# Patient Record
Sex: Female | Born: 1985 | Race: Asian | Hispanic: No | Marital: Married | Smoking: Never smoker
Health system: Southern US, Community
[De-identification: ages and names within clinical notes are randomized; demographics above are authoritative.]

## PROBLEM LIST (undated history)

## (undated) ENCOUNTER — Inpatient Hospital Stay (HOSPITAL_COMMUNITY): Payer: Self-pay

## (undated) DIAGNOSIS — R7611 Nonspecific reaction to tuberculin skin test without active tuberculosis: Secondary | ICD-10-CM

## (undated) DIAGNOSIS — Z8759 Personal history of other complications of pregnancy, childbirth and the puerperium: Secondary | ICD-10-CM

## (undated) DIAGNOSIS — B181 Chronic viral hepatitis B without delta-agent: Secondary | ICD-10-CM

## (undated) HISTORY — PX: NO PAST SURGERIES: SHX2092

## (undated) HISTORY — DX: Nonspecific reaction to tuberculin skin test without active tuberculosis: R76.11

## (undated) HISTORY — PX: DILATION AND CURETTAGE OF UTERUS: SHX78

---
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
## 2006-08-12 ENCOUNTER — Emergency Department (HOSPITAL_COMMUNITY): Admission: EM | Admit: 2006-08-12 | Discharge: 2006-08-12 | Payer: Self-pay | Admitting: Emergency Medicine

## 2008-01-03 ENCOUNTER — Emergency Department (HOSPITAL_COMMUNITY): Admission: EM | Admit: 2008-01-03 | Discharge: 2008-01-04 | Payer: Self-pay | Admitting: Emergency Medicine

## 2009-08-05 ENCOUNTER — Emergency Department (HOSPITAL_COMMUNITY): Admission: EM | Admit: 2009-08-05 | Discharge: 2009-08-06 | Payer: Self-pay | Admitting: Emergency Medicine

## 2009-08-21 ENCOUNTER — Ambulatory Visit: Payer: Self-pay | Admitting: Obstetrics and Gynecology

## 2009-08-21 LAB — CONVERTED CEMR LAB
Prolactin: 5.4 ng/mL
T3, Free: 3.5 pg/mL (ref 2.3–4.2)
Testosterone Free: 9 pg/mL — ABNORMAL HIGH (ref 0.6–6.8)
Testosterone: 63.34 ng/dL (ref 10–70)

## 2009-08-28 ENCOUNTER — Ambulatory Visit (HOSPITAL_COMMUNITY): Admission: RE | Admit: 2009-08-28 | Discharge: 2009-08-28 | Payer: Self-pay | Admitting: Obstetrics & Gynecology

## 2009-09-11 ENCOUNTER — Ambulatory Visit: Payer: Self-pay | Admitting: Obstetrics and Gynecology

## 2009-09-12 ENCOUNTER — Emergency Department (HOSPITAL_COMMUNITY): Admission: EM | Admit: 2009-09-12 | Discharge: 2009-09-12 | Payer: Self-pay | Admitting: Emergency Medicine

## 2009-12-13 ENCOUNTER — Ambulatory Visit: Payer: Self-pay | Admitting: Obstetrics and Gynecology

## 2010-03-14 ENCOUNTER — Ambulatory Visit: Payer: Self-pay | Admitting: Obstetrics & Gynecology

## 2010-06-18 ENCOUNTER — Encounter
Admission: RE | Admit: 2010-06-18 | Discharge: 2010-06-18 | Payer: Self-pay | Source: Home / Self Care | Admitting: Infectious Diseases

## 2010-09-04 DIAGNOSIS — H66009 Acute suppurative otitis media without spontaneous rupture of ear drum, unspecified ear: Secondary | ICD-10-CM

## 2010-12-11 ENCOUNTER — Telehealth: Payer: Self-pay | Admitting: Internal Medicine

## 2010-12-11 NOTE — Telephone Encounter (Signed)
Can she come in now.

## 2010-12-11 NOTE — Telephone Encounter (Signed)
Left msg for pt to call and schedule

## 2010-12-12 ENCOUNTER — Ambulatory Visit (INDEPENDENT_AMBULATORY_CARE_PROVIDER_SITE_OTHER): Payer: Medicaid Other | Admitting: Internal Medicine

## 2010-12-12 ENCOUNTER — Encounter: Payer: Self-pay | Admitting: Internal Medicine

## 2010-12-12 VITALS — BP 116/64 | HR 68 | Temp 98.8°F | Ht 60.0 in | Wt 134.0 lb

## 2010-12-12 DIAGNOSIS — M26609 Unspecified temporomandibular joint disorder, unspecified side: Secondary | ICD-10-CM

## 2010-12-12 DIAGNOSIS — J029 Acute pharyngitis, unspecified: Secondary | ICD-10-CM

## 2010-12-12 MED ORDER — CYCLOBENZAPRINE HCL 10 MG PO TABS: ORAL_TABLET | ORAL | Status: DC

## 2010-12-12 MED ORDER — AZITHROMYCIN 250 MG PO TABS: 250.0000 mg | ORAL_TABLET | Freq: Every day | ORAL | Status: DC

## 2010-12-12 NOTE — Patient Instructions (Signed)
Take meds as directed. Call if not better next week.

## 2010-12-12 NOTE — Progress Notes (Signed)
  Subjective:    Patient ID: Dana [name], female    DOB: 1985/10/10, 25 y.o.   MRN: [id number]  HPI Pt has right ear pain. Has right neck pain and headache. This has been going on for several days. Sore throat onset yesterday. No cough or cold symptoms.    Review of Systems     Objective:   Physical Exam TMs clear. Bilateral TM joint tenderness and popping. Pharynx slightly injected. Neck supple. Chest clear.. Tender right sternocleidomastoid muscle.       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

## 2011-05-04 ENCOUNTER — Encounter: Payer: Self-pay | Admitting: Internal Medicine

## 2011-05-05 ENCOUNTER — Ambulatory Visit (INDEPENDENT_AMBULATORY_CARE_PROVIDER_SITE_OTHER): Payer: Self-pay | Admitting: Internal Medicine

## 2011-05-05 ENCOUNTER — Encounter: Payer: Self-pay | Admitting: Internal Medicine

## 2011-05-05 VITALS — BP 102/70 | HR 72 | Temp 98.7°F | Ht 60.0 in | Wt 136.0 lb

## 2011-05-05 DIAGNOSIS — R0789 Other chest pain: Secondary | ICD-10-CM

## 2011-05-05 DIAGNOSIS — J069 Acute upper respiratory infection, unspecified: Secondary | ICD-10-CM

## 2011-05-05 DIAGNOSIS — N946 Dysmenorrhea, unspecified: Secondary | ICD-10-CM

## 2011-05-05 DIAGNOSIS — M542 Cervicalgia: Secondary | ICD-10-CM

## 2011-05-05 DIAGNOSIS — R071 Chest pain on breathing: Secondary | ICD-10-CM

## 2011-05-05 DIAGNOSIS — Z23 Encounter for immunization: Secondary | ICD-10-CM

## 2011-05-05 DIAGNOSIS — M549 Dorsalgia, unspecified: Secondary | ICD-10-CM

## 2011-05-05 NOTE — Progress Notes (Signed)
  Subjective:    Patient ID: Dana [name], female    DOB: 09/19/1985, 25 y.o.   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 Says it hurts to take a deep breath in her anterior chest. Some stuffiness in nose. Has had chest, back, and neck pain for several days. Is doing 7 or 8 pedicures daily. No fever or chills. No sputum production. Had hoped she would find a job as a Pharmacologist. Has not been able to find such a job. Mother has been laid off from her job. Patient also has dysmenorrhea. Has not had menstrual period this month but took pregnancy test which was negative. Patient denies sore throat. Feels like something is catching in her throat causing her to cough    Review of Systems     Objective:   Physical Exam HEENT exam: Pharynx is slightly injected. No exudate is noted. TMs are full bilaterally. Neck is supple. Chest is clear to auscultation. She has palpable chest wall tenderness in the parasternal areas bilaterally. Tender in the parascapular areas bilaterally. No significant adenopathy in neck.        Assessment & Plan:  URI  Chest wall pain  Neck pain  Upper back pain  Dysmenorrhea  Plan: Patient was treated today with Sterapred DS 10 mg 6 day dosepak, Zithromax Z-PAK take 2 tablets by mouth day one followed by 1 tablet by mouth days 2 through 5. Apply ice to neck and chest is sore for 20 minutes once or twice a day. For dysmenorrhea take Aleve 1 tab by mouth twice a day at onset of menses. Influenza immunization given today

## 2011-05-05 NOTE — Patient Instructions (Signed)
Take Zithromax Z-PAK as directed and six-day Sterapred DS 10 mg dosepak as directed. Apply ice to neck and chest for 20 minutes once or twice daily for pain relief. For menstrual cramps, take Aleve one tablet by mouth twice daily

## 2011-07-14 NOTE — L&D Delivery Note (Addendum)
Delivery Note At 2:34 PM a viable, healthy and preterm female was delivered via Vaginal, Spontaneous Delivery (Presentation: Right Occiput Anterior).  APGAR: 8, 8; weight 3 lb 12.3 oz (1710 g).   Placenta status: Retained. manual removal in fragments.  Sent to Pathology to rule out chorioamnionitis.  Uterus explored and bluntly curettaged. Cord: 3 vessels.  Anesthesia: Epidural  Episiotomy: Median Lacerations: None Suture Repair: 3.0 chromic Est. Blood Loss (mL): 400  Mom to postpartum.  Baby to NICU.  Mickel Baas 02/12/2012, 4:37 PM

## 2011-08-03 ENCOUNTER — Ambulatory Visit (INDEPENDENT_AMBULATORY_CARE_PROVIDER_SITE_OTHER): Payer: Self-pay | Admitting: Internal Medicine

## 2011-08-03 ENCOUNTER — Encounter: Payer: Self-pay | Admitting: Internal Medicine

## 2011-08-03 VITALS — BP 108/72 | HR 80 | Temp 98.6°F | Wt 132.5 lb

## 2011-08-03 DIAGNOSIS — Z3200 Encounter for pregnancy test, result unknown: Secondary | ICD-10-CM

## 2011-08-03 DIAGNOSIS — N912 Amenorrhea, unspecified: Secondary | ICD-10-CM

## 2011-08-03 NOTE — Progress Notes (Signed)
  Subjective:    Patient ID: Dana [name], female    DOB: [birth year], 26 y.o.   MRN: [id number]  HPI Pt in today for possible pregnancy. Has taken two urine pregnancy tests which were positive. Last menstrual period started on December 16 and lasted off and on for 6 days. She is married. Has been trying to get pregnant. Just returned from trip to [place] for 6 weeks last week. Had a cough and respiratory infection while over there. Took some antibiotics while there but doesn't know what she took. Says symptoms included laryngitis. Complaining of nausea for the past couple of days. Says breast are tender. Hasn't eaten much today.    Review of Systems     Objective:   Physical Exam not examined. Spent 10 minutes talking with patient.        Assessment & Plan:  Recent URI  Possible morning sickness versus viral syndrome  Likely to be pregnant with due date September 22 by dates. By dates she would be approximately [redacted] weeks pregnant. She is not on any prenatal vitamins.  Plan: Await serum hCG. Arrange appointment with obstetrician.

## 2011-08-03 NOTE — Patient Instructions (Signed)
If you or nauseated, try saltine crackers and clear liquids. Avoid orange juice and water. We will we'll contact you with results of pregnancy test tomorrow. We will arrange for obstetrical appointment.

## 2011-08-04 ENCOUNTER — Telehealth: Payer: Self-pay

## 2011-08-04 LAB — HCG, QUANTITATIVE, PREGNANCY: hCG, Beta Chain, Quant, S: 2747.5 m[IU]/mL

## 2011-08-04 NOTE — Telephone Encounter (Signed)
Opened in eror

## 2011-10-06 LAB — OB RESULTS CONSOLE RPR: RPR: NONREACTIVE

## 2011-10-06 LAB — OB RESULTS CONSOLE HIV ANTIBODY (ROUTINE TESTING): HIV: NONREACTIVE

## 2011-11-11 ENCOUNTER — Encounter (HOSPITAL_COMMUNITY): Payer: Self-pay | Admitting: *Deleted

## 2011-11-11 ENCOUNTER — Inpatient Hospital Stay (HOSPITAL_COMMUNITY)
Admission: AD | Admit: 2011-11-11 | Discharge: 2011-11-12 | Disposition: A | Discharge status: Home / Self Care | Payer: Medicaid Other | Source: Ambulatory Visit | Attending: Obstetrics and Gynecology | Admitting: Obstetrics and Gynecology

## 2011-11-11 DIAGNOSIS — O2390 Unspecified genitourinary tract infection in pregnancy, unspecified trimester: Secondary | ICD-10-CM | POA: Insufficient documentation

## 2011-11-11 DIAGNOSIS — B373 Candidiasis of vulva and vagina: Secondary | ICD-10-CM | POA: Insufficient documentation

## 2011-11-11 DIAGNOSIS — O26859 Spotting complicating pregnancy, unspecified trimester: Secondary | ICD-10-CM

## 2011-11-11 DIAGNOSIS — B3731 Acute candidiasis of vulva and vagina: Secondary | ICD-10-CM | POA: Insufficient documentation

## 2011-11-11 DIAGNOSIS — O26899 Other specified pregnancy related conditions, unspecified trimester: Secondary | ICD-10-CM

## 2011-11-11 DIAGNOSIS — O209 Hemorrhage in early pregnancy, unspecified: Secondary | ICD-10-CM | POA: Insufficient documentation

## 2011-11-11 HISTORY — DX: Chronic viral hepatitis B without delta-agent: B18.1

## 2011-11-11 NOTE — MAU Note (Signed)
Pt G1 EDC 04/03/2012, vaginal itching and discharge today.  After wiping noticed small amt of blood.  Pt having upper and lower abd pain.

## 2011-11-12 ENCOUNTER — Inpatient Hospital Stay (HOSPITAL_COMMUNITY): Payer: Medicaid Other

## 2011-11-12 ENCOUNTER — Encounter (HOSPITAL_COMMUNITY): Payer: Self-pay | Admitting: *Deleted

## 2011-11-12 LAB — URINE MICROSCOPIC-ADD ON

## 2011-11-12 LAB — URINALYSIS, ROUTINE W REFLEX MICROSCOPIC
Glucose, UA: NEGATIVE mg/dL
Ketones, ur: NEGATIVE mg/dL
Specific Gravity, Urine: 1.015 (ref 1.005–1.030)
pH: 7.5 (ref 5.0–8.0)

## 2011-11-12 LAB — WET PREP, GENITAL

## 2011-11-12 MED ORDER — FLUCONAZOLE 150 MG PO TABS: 150.0000 mg | ORAL_TABLET | Freq: Once | ORAL | Status: AC

## 2011-11-12 NOTE — Discharge Instructions (Signed)
Monilial Vaginitis Vaginitis in a soreness, swelling and redness (inflammation) of the vagina and vulva. Monilial vaginitis is not a sexually transmitted infection. CAUSES  Yeast vaginitis is caused by yeast (candida) that is normally found in your vagina. With a yeast infection, the candida has overgrown in number to a point that upsets the chemical balance. SYMPTOMS   White, thick vaginal discharge.   Swelling, itching, redness and irritation of the vagina and possibly the lips of the vagina (vulva).   Burning or painful urination.   Painful intercourse.  DIAGNOSIS  Things that may contribute to monilial vaginitis are:  Postmenopausal and virginal states.   Pregnancy.   Infections.   Being tired, sick or stressed, especially if you had monilial vaginitis in the past.   Diabetes. Good control will help lower the chance.   Birth control pills.   Tight fitting garments.   Using bubble bath, feminine sprays, douches or deodorant tampons.   Taking certain medications that kill germs (antibiotics).   Sporadic recurrence can occur if you become ill.  TREATMENT  Your caregiver will give you medication.  There are several kinds of anti monilial vaginal creams and suppositories specific for monilial vaginitis. For recurrent yeast infections, use a suppository or cream in the vagina 2 times a week, or as directed.   Anti-monilial or steroid cream for the itching or irritation of the vulva may also be used. Get your caregiver's permission.   Painting the vagina with methylene blue solution may help if the monilial cream does not work.   Eating yogurt may help prevent monilial vaginitis.  HOME CARE INSTRUCTIONS   Finish all medication as prescribed.   Do not have sex until treatment is completed or after your caregiver tells you it is okay.   Take warm sitz baths.   Do not douche.   Do not use tampons, especially scented ones.   Wear cotton underwear.   Avoid tight  pants and panty hose.   Tell your sexual partner that you have a yeast infection. They should go to their caregiver if they have symptoms such as mild rash or itching.   Your sexual partner should be treated as well if your infection is difficult to eliminate.   Practice safer sex. Use condoms.   Some vaginal medications cause latex condoms to fail. Vaginal medications that harm condoms are:   Cleocin cream.   Butoconazole (Femstat).   Terconazole (Terazol) vaginal suppository.   Miconazole (Monistat) (may be purchased over the counter).  SEEK MEDICAL CARE IF:   You have a temperature by mouth above 102 F (38.9 C).   The infection is getting worse after 2 days of treatment.   The infection is not getting better after 3 days of treatment.   You develop blisters in or around your vagina.   You develop vaginal bleeding, and it is not your menstrual period.   You have pain when you urinate.   You develop intestinal problems.   You have pain with sexual intercourse.  Document Released: 04/08/2005 Document Revised: 06/18/2011 Document Reviewed: 12/21/2008 Exit[employer] Patient Information 2012 [employer]. 

## 2011-11-12 NOTE — MAU Provider Note (Signed)
Dana [name] 11 y.o.G1P0 @[redacted]w[redacted]d  by LMP Chief Complaint  Patient presents with  . Vaginal Bleeding  . Vaginal Discharge  . Abdominal Pain     First Provider Initiated Contact with Patient 11/12/11 0026      SUBJECTIVE  HPI: Presents with one-day history of external genital itch and later noted right red blood on toilet paper after wiping. She denies cramping or pain except some upper abdominal "gas pain" and groin discomfort, worse with walking. Last intercourse 2 days ago. Has never had STI. Has not yet had anatomic ultrasound.  Past Medical History  Diagnosis Date  . Positive PPD   . Hepatitis B carrier 2003   Past Surgical History  Procedure Date  . No past surgeries    History   Social History  . Marital Status: Single    Spouse Name: N/A    Number of Children: N/A  . Years of Education: N/A   Occupational History  . Not on file.   Social History Main Topics  . Smoking status: Never Smoker   . Smokeless tobacco: Never Used  . Alcohol Use: No  . Drug Use: No  . Sexually Active: Yes   Other Topics Concern  . Not on file   Social History Narrative  . No narrative on file   No current facility-administered medications on file prior to encounter.   No current outpatient prescriptions on file prior to encounter.   No Known Allergies  ROS: Pertinent items in HPI  OBJECTIVE Blood pressure 117/66, pulse 83, temperature 97 F (36.1 C), temperature source Oral, resp. rate 16, height 5' (1.524 m), weight 64.229 kg (141 lb 9.6 oz), last menstrual period 06/28/2011.  GENERAL: Well-developed, well-nourished female in no acute distress.  ABDOMEN: Soft, nontender, S=D, FHR 152 EXTREMITIES: Nontender, no edema NEURO: Alert and oriented SPECULUM EXAM: NEFG, scant brownish discharge, cervix clean VE:  cervix L/C/H, scant brownish blood  LAB RESULTS Results for orders placed during the hospital encounter of 11/11/11 (from the past 24 hour(s))  URINALYSIS, ROUTINE W  REFLEX MICROSCOPIC     Status: Abnormal   Collection Time   11/11/11 11:55 PM      Component Value Range   Color, Urine YELLOW  YELLOW    APPearance CLOUDY (*) CLEAR    Specific Gravity, Urine 1.015  1.005 - 1.030    pH 7.5  5.0 - 8.0    Glucose, UA NEGATIVE  NEGATIVE (mg/dL)   Hgb urine dipstick NEGATIVE  NEGATIVE    Bilirubin Urine NEGATIVE  NEGATIVE    Ketones, ur NEGATIVE  NEGATIVE (mg/dL)   Protein, ur NEGATIVE  NEGATIVE (mg/dL)   Urobilinogen, UA 0.2  0.0 - 1.0 (mg/dL)   Nitrite NEGATIVE  NEGATIVE    Leukocytes, UA TRACE (*) NEGATIVE   URINE MICROSCOPIC-ADD ON     Status: Abnormal   Collection Time   11/11/11 11:55 PM      Component Value Range   Squamous Epithelial / LPF FEW (*) RARE    WBC, UA 0-2  <3 (WBC/hpf)   Bacteria, UA FEW (*) RARE   WET PREP, GENITAL     Status: Abnormal   Collection Time   11/12/11 12:30 AM      Component Value Range   Yeast Wet Prep HPF POC FEW (*) NONE SEEN    Trich, Wet Prep NONE SEEN  NONE SEEN    Clue Cells Wet Prep HPF POC NONE SEEN  NONE SEEN    WBC, Wet Prep HPF  POC FEW (*) NONE SEEN      IMAGING Limited US: FHR 143, plac ant above os with no evidence previa or abruption, cx 4 cm  ASSESSMENT G1 at [redacted]w[redacted]d Second tri scant bleeding Yeast vulvovaginitis  PLAN C/W Dr. Tenny [name] Rx Diflucan Reassured Pelvic rest until next appointment     Dana [name] 11/12/2011 12:48 AM

## 2011-12-15 ENCOUNTER — Encounter (HOSPITAL_COMMUNITY): Payer: Self-pay

## 2011-12-15 ENCOUNTER — Inpatient Hospital Stay (HOSPITAL_COMMUNITY)
Admission: AD | Admit: 2011-12-15 | Discharge: 2011-12-15 | Disposition: A | Discharge status: Home / Self Care | Payer: Medicaid Other | Source: Ambulatory Visit | Attending: Obstetrics and Gynecology | Admitting: Obstetrics and Gynecology

## 2011-12-15 DIAGNOSIS — O4700 False labor before 37 completed weeks of gestation, unspecified trimester: Secondary | ICD-10-CM | POA: Insufficient documentation

## 2011-12-15 DIAGNOSIS — N390 Urinary tract infection, site not specified: Secondary | ICD-10-CM

## 2011-12-15 DIAGNOSIS — B373 Candidiasis of vulva and vagina: Secondary | ICD-10-CM | POA: Insufficient documentation

## 2011-12-15 DIAGNOSIS — B3731 Acute candidiasis of vulva and vagina: Secondary | ICD-10-CM | POA: Insufficient documentation

## 2011-12-15 DIAGNOSIS — O2390 Unspecified genitourinary tract infection in pregnancy, unspecified trimester: Secondary | ICD-10-CM | POA: Insufficient documentation

## 2011-12-15 DIAGNOSIS — O2340 Unspecified infection of urinary tract in pregnancy, unspecified trimester: Secondary | ICD-10-CM

## 2011-12-15 DIAGNOSIS — B379 Candidiasis, unspecified: Secondary | ICD-10-CM

## 2011-12-15 DIAGNOSIS — R300 Dysuria: Secondary | ICD-10-CM | POA: Insufficient documentation

## 2011-12-15 LAB — WET PREP, GENITAL: Clue Cells Wet Prep HPF POC: NONE SEEN

## 2011-12-15 LAB — URINALYSIS, ROUTINE W REFLEX MICROSCOPIC
Bilirubin Urine: NEGATIVE
Hgb urine dipstick: NEGATIVE
pH: 7 (ref 5.0–8.0)

## 2011-12-15 LAB — URINE MICROSCOPIC-ADD ON

## 2011-12-15 MED ORDER — NITROFURANTOIN MONOHYD MACRO 100 MG PO CAPS: 100.0000 mg | ORAL_CAPSULE | Freq: Two times a day (BID) | ORAL | Status: AC

## 2011-12-15 MED ORDER — TERCONAZOLE 80 MG VA SUPP: 80.0000 mg | Freq: Every day | VAGINAL | Status: AC

## 2011-12-15 NOTE — Discharge Instructions (Signed)
Urinary Tract Infection in Pregnancy A urinary tract infection (UTI) is a bacterial infection of the urinary tract. Infection of the urinary tract can include the ureters, kidneys (pyelonephritis), bladder (cystitis), and urethra (urethritis). All pregnant women should be screened for bacteria in the urinary tract. Identifying and treating a UTI will decrease the risk of preterm labor and developing more serious infections in both the mother and baby. CAUSES Bacteria germs cause almost all UTIs. There are many factors that can increase your chances of getting a UTI during pregnancy. These include:  Having a short urethra.   Poor toilet and hygiene habits.   Sexual intercourse.   Blockage of urine along the urinary tract.   Problems with the pelvic muscles or nerves.   Diabetes.   Obesity.   Bladder problems after having several children.   Previous history of UTI.  SYMPTOMS   Pain, burning, or a stinging feeling when urinating.   Suddenly feeling the need to urinate right away (urgency).   Loss of bladder control (urinary incontinence).   Frequent urination, more than is common with pregnancy.   Lower abdominal or back discomfort.   Bad smelling urine.   Cloudy urine.   Blood in the urine (hematuria).   Fever.  When the kidneys are infected, the symptoms may be:  Back pain.   Flank pain on the right side more so than the left.   Fever.   Chills.   Nausea.   Vomiting.  DIAGNOSIS   Urine tests.   Additional tests and procedures may include:   Ultrasound of the kidneys, ureters, bladder, and urethra.   Looking in the bladder with a lighted tube (cystoscopy).   Certain X-ray studies only when absolutely necessary.  Finding out the results of your test Ask when your test results will be ready. Make sure you get your test results. TREATMENT  Antibiotic medicine by mouth.   Antibiotics given through the vein (intravenously), if needed.  HOME CARE  INSTRUCTIONS   Take your antibiotics as directed. Finish them even if you start to feel better. Only take medicine as directed by your caregiver.   Drink enough fluids to keep your urine clear or pale yellow.   Do not have sexual intercourse until the infection is gone and your caregiver says it is okay.   Make sure you are tested for UTIs throughout your pregnancy if you get one. These infections often come back.  Preventing a UTI in the future:  Practice good toilet habits. Always wipe from front to back. Use the tissue only once.   Do not hold your urine. Empty your bladder as soon as possible when the urge comes.   Do not douche or use deodorant sprays.   Wash with soap and warm water around the genital area and the anus.   Empty your bladder before and after sexual intercourse.   Wear underwear with a cotton crotch.   Avoid caffeine and carbonated drinks. They can irritate the bladder.   Drink cranberry juice or take cranberry pills. This may decrease the risk of getting a UTI.   Do not drink alcohol.   Keep all your appointments and tests as scheduled.  SEEK MEDICAL CARE IF:   Your symptoms get worse.   You are still having fevers 2 or more days after treatment begins.   You develop a rash.   You feel that you are having problems with medicines prescribed.   You develop abnormal vaginal discharge.  SEEK IMMEDIATE MEDICAL   CARE IF:   You develop back or flank pain.   You develop chills.   You have blood in your urine.   You develop nausea and vomiting.   You develop contractions of your uterus.   You have a gush of fluid from the vagina.  MAKE SURE YOU:   Understand these instructions.   Will watch your condition.   Will get help right away if you are not doing well or get worse.  Document Released: 10/24/2010 Document Revised: 06/18/2011 Document Reviewed: 10/24/2010 Exit[employer] Patient Information 2012 [employer]. 

## 2011-12-15 NOTE — MAU Note (Signed)
Contractions 4-5 times an hour since 8pm. Spotting on toilet paper on Friday. Vaginal itching & burning with urination for a few days. Denies vaginal discharge. Positive fetal movement.

## 2011-12-15 NOTE — MAU Provider Note (Addendum)
History     CSN: [id number]  Arrival date and time: 12/15/11 0008   None     Chief Complaint  Patient presents with  . Contractions   HPI This is a 26 year old G2 P0 010 at 23 weeks and one-day who presents the MAU with 4 hours of contractions. The patient feels 5-6 contractions an hour. No palliating provoking factors. The patient does admit to having dysuria for 3 days as well as vaginal itching. She denies fevers, chills, nausea, vomiting. She has mild lumbosacral pain.  OB History    Grav Para Term Preterm Abortions TAB SAB Ect Mult Living   2    1 1           Past Medical History  Diagnosis Date  . Positive PPD   . Hepatitis B carrier 2003    Past Surgical History  Procedure Date  . No past surgeries     Family History  Problem Relation Age of Onset  . Hypertension Father   . Anesthesia problems Neg Hx     History  Substance Use Topics  . Smoking status: Never Smoker   . Smokeless tobacco: Never Used  . Alcohol Use: No    Allergies: No Known Allergies  Prescriptions prior to admission  Medication Sig Dispense Refill  . alum & mag hydroxide-simeth (MAALOX/MYLANTA) 200-200-20 MG/5ML suspension Take 5 mLs by mouth every 6 (six) hours as needed. For upset stomach      . Prenatal Vit-Fe Fumarate-FA (PRENATAL MULTIVITAMIN) TABS Take 1 tablet by mouth daily.      Marland Kitchen acetaminophen (TYLENOL) 325 MG tablet Take 325 mg by mouth every 6 (six) hours as needed. For headache        Review of Systems  Constitutional: Negative for fever, chills and weight loss.  Gastrointestinal: Negative for nausea, vomiting, abdominal pain, diarrhea and constipation.   Physical Exam   Blood pressure 113/66, pulse 86, temperature 97.3 F (36.3 C), temperature source Oral, resp. rate 18, height 5' (1.524 m), weight 66.497 kg (146 lb 9.6 oz), last menstrual period 06/28/2011, SpO2 98.00%.  Physical Exam  Constitutional: She is oriented to person, place, and time. She appears  well-developed and well-nourished.  Respiratory: Effort normal.  GI: Soft. She exhibits no distension and no mass. There is no tenderness. There is no rebound and no guarding.  Genitourinary:       Normal external female genitalia. White clumpy discharge. Mild cervicitis with friability. Slight bleeding after GC chlamydia swab obtained. Cervix visually closed  Neurological: She is alert and oriented to person, place, and time.  Skin: Skin is warm and dry.  Psychiatric: She has a normal mood and affect. Her behavior is normal. Judgment and thought content normal.   Cervix closed, long, thick  Results for orders placed during the hospital encounter of 12/15/11 (from the past 24 hour(s))  URINALYSIS, ROUTINE W REFLEX MICROSCOPIC     Status: Abnormal   Collection Time   12/15/11 12:12 AM      Component Value Range   Color, Urine YELLOW  YELLOW    APPearance CLEAR  CLEAR    Specific Gravity, Urine 1.020  1.005 - 1.030    pH 7.0  5.0 - 8.0    Glucose, UA NEGATIVE  NEGATIVE (mg/dL)   Hgb urine dipstick NEGATIVE  NEGATIVE    Bilirubin Urine NEGATIVE  NEGATIVE    Ketones, ur NEGATIVE  NEGATIVE (mg/dL)   Protein, ur NEGATIVE  NEGATIVE (mg/dL)   Urobilinogen, UA 0.2  0.0 - 1.0 (mg/dL)   Nitrite NEGATIVE  NEGATIVE    Leukocytes, UA SMALL (*) NEGATIVE   URINE MICROSCOPIC-ADD ON     Status: Abnormal   Collection Time   12/15/11 12:12 AM      Component Value Range   Squamous Epithelial / LPF MANY (*) RARE    WBC, UA 7-10  <3 (WBC/hpf)   RBC / HPF 0-2  <3 (RBC/hpf)   Bacteria, UA FEW (*) RARE    Urine-Other MUCOUS PRESENT    WET PREP, GENITAL     Status: Abnormal   Collection Time   12/15/11 12:41 AM      Component Value Range   Yeast Wet Prep HPF POC FEW (*) NONE SEEN    Trich, Wet Prep NONE SEEN  NONE SEEN    Clue Cells Wet Prep HPF POC NONE SEEN  NONE SEEN    WBC, Wet Prep HPF POC FEW (*) NONE SEEN      MAU Course  Procedures NST category 1 tracing consistent with gestational age.  There are no contractions seen despite being monitored for almost an hour.  MDM No evidence of pyelonephritis given no CVA tenderness or fever. We'll treat patient for UTI and yeast infection. No evidence of preterm labor.  Assessment and Plan  #1 UTI #2 vaginal yeast infection We'll treat patient with Macrobid and Terazol vaginal suppositories respectively. Patient followup with [facility] at next appointment or sooner if this not tolerate medications. Discussed patient with Dr. Tenny Craw.  Dana [name] JEHIEL 12/15/2011, 12:48 AM

## 2011-12-16 LAB — GC/CHLAMYDIA PROBE AMP, GENITAL: GC Probe Amp, Genital: NEGATIVE

## 2012-01-11 LAB — OB RESULTS CONSOLE HGB/HCT, BLOOD
HCT: 40 %
Hemoglobin: 12.5 g/dL

## 2012-01-11 LAB — OB RESULTS CONSOLE PLATELET COUNT: Platelets: 257 10*3/uL

## 2012-01-23 ENCOUNTER — Encounter (HOSPITAL_COMMUNITY): Payer: Self-pay | Admitting: Obstetrics and Gynecology

## 2012-01-23 ENCOUNTER — Inpatient Hospital Stay (HOSPITAL_COMMUNITY)
Admission: AD | Admit: 2012-01-23 | Discharge: 2012-02-14 | DRG: 774 | Disposition: A | Discharge status: Home / Self Care | Payer: Medicaid Other | Source: Ambulatory Visit | Attending: Obstetrics and Gynecology | Admitting: Obstetrics and Gynecology

## 2012-01-23 ENCOUNTER — Inpatient Hospital Stay (HOSPITAL_COMMUNITY): Payer: Medicaid Other

## 2012-01-23 DIAGNOSIS — O4690 Antepartum hemorrhage, unspecified, unspecified trimester: Secondary | ICD-10-CM | POA: Diagnosis not present

## 2012-01-23 DIAGNOSIS — O4290 Premature rupture of membranes, unspecified as to length of time between rupture and onset of labor, unspecified weeks of gestation: Principal | ICD-10-CM | POA: Diagnosis present

## 2012-01-23 DIAGNOSIS — K219 Gastro-esophageal reflux disease without esophagitis: Secondary | ICD-10-CM | POA: Diagnosis not present

## 2012-01-23 DIAGNOSIS — O99892 Other specified diseases and conditions complicating childbirth: Secondary | ICD-10-CM | POA: Diagnosis not present

## 2012-01-23 HISTORY — DX: Personal history of other complications of pregnancy, childbirth and the puerperium: Z87.59

## 2012-01-23 LAB — WET PREP, GENITAL
Clue Cells Wet Prep HPF POC: NONE SEEN
Trich, Wet Prep: NONE SEEN

## 2012-01-23 LAB — CBC
Hemoglobin: 12.9 g/dL (ref 12.0–15.0)
MCH: 25.4 pg — ABNORMAL LOW (ref 26.0–34.0)
Platelets: 241 10*3/uL (ref 150–400)
RBC: 5.07 MIL/uL (ref 3.87–5.11)
WBC: 13 10*3/uL — ABNORMAL HIGH (ref 4.0–10.5)

## 2012-01-23 MED ORDER — SODIUM CHLORIDE 0.9 % IJ SOLN
3.0000 mL | Freq: Two times a day (BID) | INTRAMUSCULAR | Status: DC
  Administered 2012-01-23 (×2): 3 mL via INTRAVENOUS

## 2012-01-23 MED ORDER — ZOLPIDEM TARTRATE 5 MG PO TABS
5.0000 mg | ORAL_TABLET | Freq: Every evening | ORAL | Status: DC | PRN
  Administered 2012-02-07 – 2012-02-12 (×2): 5 mg via ORAL
  Filled 2012-01-23 (×2): qty 1

## 2012-01-23 MED ORDER — BETAMETHASONE SOD PHOS & ACET 6 (3-3) MG/ML IJ SUSP
12.0000 mg | INTRAMUSCULAR | Status: AC
  Administered 2012-01-23 – 2012-01-24 (×2): 12 mg via INTRAMUSCULAR
  Filled 2012-01-23 (×2): qty 2

## 2012-01-23 MED ORDER — LACTATED RINGERS IV SOLN
INTRAVENOUS | Status: DC
  Administered 2012-01-23 – 2012-01-26 (×7): via INTRAVENOUS

## 2012-01-23 MED ORDER — CALCIUM CARBONATE ANTACID 500 MG PO CHEW
2.0000 | CHEWABLE_TABLET | ORAL | Status: DC | PRN
  Administered 2012-01-25 – 2012-02-09 (×3): 400 mg via ORAL
  Filled 2012-01-23 (×4): qty 2

## 2012-01-23 MED ORDER — DOCUSATE SODIUM 100 MG PO CAPS
100.0000 mg | ORAL_CAPSULE | Freq: Every day | ORAL | Status: DC
  Administered 2012-01-23 – 2012-02-11 (×20): 100 mg via ORAL
  Filled 2012-01-23 (×22): qty 1

## 2012-01-23 MED ORDER — LACTATED RINGERS IV BOLUS (SEPSIS)
1000.0000 mL | Freq: Once | INTRAVENOUS | Status: AC
  Administered 2012-01-23: 1000 mL via INTRAVENOUS

## 2012-01-23 MED ORDER — PRENATAL MULTIVITAMIN CH
1.0000 | ORAL_TABLET | Freq: Every day | ORAL | Status: DC
  Administered 2012-01-23 – 2012-02-11 (×20): 1 via ORAL
  Filled 2012-01-23 (×22): qty 1

## 2012-01-23 MED ORDER — ACETAMINOPHEN 325 MG PO TABS
650.0000 mg | ORAL_TABLET | ORAL | Status: DC | PRN
  Administered 2012-01-24 – 2012-02-11 (×4): 650 mg via ORAL
  Filled 2012-01-23 (×5): qty 2

## 2012-01-23 NOTE — Progress Notes (Signed)
I was asked by Kerrie Buffalo NP to update Dr. Claiborne Billings regarding patients Dana [name] and cervical exam. Dr. Claiborne Billings notified of patients Dana [name] results and cervical exam fingertip, middle, brown discharge, 2 contractions noted in the last 30 mins of fetal strip. ABO/RH ordered per Dr. Claiborne Billings, ok for patient to wait for results in lobby. If RH neg will call Dr. Claiborne Billings for orders. Lynder Parents NP notified of plan of care.

## 2012-01-23 NOTE — H&P (Signed)
26 y.o. [redacted]w[redacted]d  G2P0010 comes in c/o bleeding.  Per pt she noted some RLQ pain while at work yesterday.  When arriving home noted dark brown blood when wiping.  This continued several more times.  This morning she reported that she had some bright red bleeding but not a large amount.  She states she has had no pain since yesterday and does not currently have any pain.  Was alerted by MAU that during spec exam bright red bleeding was noticed coming from the os, and that the previously stated 2 ctx in 30 min had increased to every 4 mins.  Otherwise has good fetal movement.  Past Medical History  Diagnosis Date  . Positive PPD   . 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 Abortion history     Past Surgical History  Procedure Date  . No past surgeries     OB History    Grav Para Term Preterm Abortions TAB SAB Ect Mult Living   2 0 0 0 1 1  0 0 0     # Outc Date GA Lbr Len/2nd Wgt Sex Del Anes PTL Lv   1 TAB            2 CUR               History   Social History  . Marital Status: Single    Spouse Name: N/A    Number of Children: N/A  . Years of Education: N/A   Occupational History  . Not on file.   Social History Main Topics  . Smoking status: Never Smoker   . Smokeless tobacco: Never Used  . Alcohol Use: No  . Drug Use: No  . Sexually Active: Yes   Other Topics Concern  . Not on file   Social History Narrative  . No narrative on file   Review of patient's allergies indicates no known allergies.   Prenatal Course:  Hep B carrier, hx of +PPD, otherwise uncomplicated  Filed Vitals:   01/23/12 1415  BP: 103/65  Pulse: 89  Temp: 97.1 F (36.2 C)  Resp: 20     Lungs/Cor:  NAD Abdomen:  soft, gravid Ex:  no cords, erythema SVE:  FT/thick/posterior FHTs:  130, good STV, NST R, some shallow variables Toco:  Irreg, recently q4-5   A/P  Admit for observation, r/o placental abruption  Betamethasone x 2  No tocolysis at this time  Continuous fetal monitoring  Check  Abo/Rh  Other routine care.  Philip Aspen

## 2012-01-23 NOTE — MAU Provider Note (Signed)
History     CSN: [id number]  Arrival date and time: 01/23/12 1191   First Provider Initiated Contact with Patient 01/23/12 1051      Chief Complaint  Patient presents with  . Vaginal Bleeding   HPI Dana [name] is a 26 y.o. female @ [redacted]w[redacted]d gestation who presents to MAU for vaginal bleeding. The bleeding started yesterday. She describes the bleeding as light when she went to the bathroom. Today the bleeding has increased. Lower abdominal pain that she describes as cramping with radiation to the lower back. She rates the pain as 7/10. Last sexual intercourse 2 months ago. No problems with the pregnancy until yesterday. The history was provided by the patient.  OB History    Grav Para Term Preterm Abortions TAB SAB Ect Mult Living   2 0 0 0 1 1  0 0 0      Past Medical History  Diagnosis Date  . Positive PPD   . 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 Abortion history     Past Surgical History  Procedure Date  . No past surgeries     Family History  Problem Relation Age of Onset  . Hypertension Father   . Anesthesia problems Neg Hx     History  Substance Use Topics  . Smoking status: Never Smoker   . Smokeless tobacco: Never Used  . Alcohol Use: No    Allergies: No Known Allergies  Prescriptions prior to admission  Medication Sig Dispense Refill  . Prenatal Vit-Fe Fumarate-FA (PRENATAL MULTIVITAMIN) TABS Take 1 tablet by mouth daily.        Review of Systems  Constitutional: Negative for fever, chills, weight loss and malaise/fatigue.  HENT: Negative for ear pain, nosebleeds, congestion and sore throat.   Eyes: Negative for blurred vision, double vision and photophobia.  Respiratory: Negative for cough and wheezing.   Cardiovascular: Negative for chest pain, palpitations and leg swelling.  Gastrointestinal: Positive for abdominal pain. Negative for nausea, vomiting, diarrhea and constipation.  Genitourinary: Positive for urgency (pressure) and frequency. Negative for  dysuria and flank pain.       Vaginal bleeding.  Musculoskeletal: Positive for back pain.  Skin: Negative.   Neurological: Positive for headaches. Negative for dizziness and seizures.  Psychiatric/Behavioral: Negative for depression. The patient is not nervous/anxious.    Physical Exam   Blood pressure 110/66, pulse 94, temperature 98.2 F (36.8 C), temperature source Oral, resp. rate 18, height 5' (1.524 m), weight 151 lb (68.493 kg), last menstrual period 06/28/2011.  Physical Exam  Nursing note and vitals reviewed. Constitutional: She is oriented to person, place, and time. She appears well-developed and well-nourished. No distress.  HENT:  Head: Normocephalic and atraumatic.  Eyes: EOM are normal.  Neck: Neck supple.  Cardiovascular: Normal rate.   Respiratory: Effort normal.  GI: Soft. There is tenderness in the right lower quadrant, suprapubic area and left lower quadrant.       Tenderness is minimal.  Genitourinary:       Cervix = finger tip, firm, brown vaginal discharge.  Musculoskeletal: Normal range of motion.  Neurological: She is alert and oriented to person, place, and time.  Skin: Skin is warm and dry.  Psychiatric: She has a normal mood and affect. Her behavior is normal. Judgment and thought content normal.   EFM: Baseline 130, reactive tracing category I, 3 contractions in 30 minute  MAU Course: Dr. Claiborne Billings notified of clinical finding  Procedures Ultrasound, normal placenta, cervical length and fluid.  Dana,[name] 01/23/2012, 10:53 AM   Dr. Claiborne Billings request blood type and if Rh negative patient may be discharged home to follow up in the office.   Patient began contracting again. Care turned over to Dana [name], CNM she will re evaluate the patient and call Dr. Claiborne Billings with results.  [place], RN, FNP, [facility]   SSE:  Small amount of bright red blood coming out of cervical os.  CX non-friable.  Normal appearing discharge except for dark brown blood.  CX  FT/Long/soft/anterior.  Now having contractions q 5-10 minutes, rated 7/10 by pt, felt in lower abdomen and radiating towards back. Dr. Maximino Greenland given status and will admit for steroids/observation for presumed chronic abruption.

## 2012-01-23 NOTE — Progress Notes (Signed)
I was asked by Kerrie Buffalo NP to contact Dr. Claiborne Billings and notify her of patient complaints of vaginal bleeding and abdominal pain. Dr. Claiborne Billings notified of contractions and Fetal strip, patient states she is bleeding more than a period. Orders received and will notify Dr. Claiborne Billings when results are back.

## 2012-01-23 NOTE — MAU Note (Signed)
Pt states scant amount bright red - dark red vaginal bleeding since last pm.

## 2012-01-23 NOTE — MAU Note (Signed)
Mayer Camel Np asked me to check patients cervix and notify her of results.

## 2012-01-23 NOTE — Progress Notes (Signed)
1545-drClaiborne Billings notifed of uc pattern and small amt of bleeding on peri-pad. Orders received for lr bolus over 1hour.

## 2012-01-23 NOTE — MAU Note (Signed)
Patient reports having vaginal bleeding when wipes since last night, positive fm, patient taken directly to room 4 from lobby.

## 2012-01-24 LAB — CBC
MCV: 77.3 fL — ABNORMAL LOW (ref 78.0–100.0)
Platelets: 236 10*3/uL (ref 150–400)
RBC: 5.03 MIL/uL (ref 3.87–5.11)
RDW: 14.4 % (ref 11.5–15.5)
WBC: 20.9 10*3/uL — ABNORMAL HIGH (ref 4.0–10.5)

## 2012-01-24 LAB — RAPID HIV SCREEN (WH-MAU): Rapid HIV Screen: NONREACTIVE

## 2012-01-24 LAB — RUBELLA SCREEN: Rubella: 69 IU/mL — ABNORMAL HIGH

## 2012-01-24 LAB — DIFFERENTIAL
Basophils Absolute: 0 10*3/uL (ref 0.0–0.1)
Eosinophils Absolute: 0 10*3/uL (ref 0.0–0.7)
Eosinophils Relative: 0 % (ref 0–5)
Lymphocytes Relative: 7 % — ABNORMAL LOW (ref 12–46)
Neutrophils Relative %: 89 % — ABNORMAL HIGH (ref 43–77)

## 2012-01-24 LAB — OB RESULTS CONSOLE HIV ANTIBODY (ROUTINE TESTING): HIV: NONREACTIVE

## 2012-01-24 MED ORDER — SODIUM CHLORIDE 0.9 % IV SOLN
2.0000 g | Freq: Four times a day (QID) | INTRAVENOUS | Status: AC
  Administered 2012-01-24 – 2012-01-25 (×8): 2 g via INTRAVENOUS
  Filled 2012-01-24 (×8): qty 2000

## 2012-01-24 MED ORDER — SODIUM CHLORIDE 0.9 % IV SOLN
250.0000 mg | Freq: Four times a day (QID) | INTRAVENOUS | Status: DC
  Administered 2012-01-24 – 2012-01-26 (×9): 250 mg via INTRAVENOUS
  Filled 2012-01-24 (×10): qty 250

## 2012-01-24 MED ORDER — SODIUM CHLORIDE 0.9 % IV SOLN: 2.0000 g | Freq: Four times a day (QID) | INTRAVENOUS | Status: DC

## 2012-01-24 NOTE — Progress Notes (Signed)
Pt comfortable, denies feeling ctx, no continued VB +FM, +LOF - RN confirmed +ferns AFVSS FHT 125 mod var +accels no recent decels TOCO 3 ctx in past hour SVE: def A/P: PPROM Contiue Ampicllin 2g q6 + Erythromycin 250mg  q6 x 48, followed by po amoxicillin and po erythormycin for 5 days for latency Second dose of beta today No tocolysis Continuous fetal monitoring

## 2012-01-24 NOTE — Plan of Care (Signed)
Problem: Consults Goal: Birthing Suites Patient Information Press F2 to bring up selections list   Pt < [redacted] weeks EGA     

## 2012-01-25 ENCOUNTER — Inpatient Hospital Stay (HOSPITAL_COMMUNITY): Payer: Medicaid Other

## 2012-01-25 LAB — CBC
MCH: 25.2 pg — ABNORMAL LOW (ref 26.0–34.0)
MCHC: 32.1 g/dL (ref 30.0–36.0)
Platelets: 260 10*3/uL (ref 150–400)
RBC: 4.81 MIL/uL (ref 3.87–5.11)

## 2012-01-25 LAB — HEPATITIS B SURFACE ANTIGEN: Hepatitis B Surface Ag: POSITIVE — AB

## 2012-01-25 NOTE — Consult Note (Signed)
[facility]  Neonatal Medicine Consultation       01/25/2012    12:07 AM  I was called at the request of the patient's obstetrician (Dr. Claiborne Billings) to speak to this patient due to premature rupture of membranes at 28-[redacted] weeks gestation.  She presented to the hospital yesterday with vaginal bleeding worrisome for placental abruption (she has improved since then).  She has been given betamethasone and antibiotics.    I reviewed expectations for a preterm delivery at this gestational age and beyond.  I covered expected survival, length of stay, morbidity, and long-term outcome.  In particular I discussed respiratory distress and the possible treatments, infection, feeding, intracranial bleeding.  Mom plans to breast feed, which I encouraged.  She had a number of questions that I was able to discuss.  _____________________ Electronically Signed By: Angelita Ingles, MD Neonatologist

## 2012-01-25 NOTE — Progress Notes (Signed)
Afebrile, VSS.  Will do AFI today for baseline status post PPROM.

## 2012-01-26 MED ORDER — SODIUM CHLORIDE 0.9 % IJ SOLN
3.0000 mL | Freq: Two times a day (BID) | INTRAMUSCULAR | Status: DC
  Administered 2012-01-26 – 2012-01-27 (×3): 3 mL via INTRAVENOUS

## 2012-01-26 MED ORDER — AMOXICILLIN 500 MG PO CAPS
500.0000 mg | ORAL_CAPSULE | Freq: Three times a day (TID) | ORAL | Status: DC
  Administered 2012-01-26 – 2012-01-30 (×12): 500 mg via ORAL
  Filled 2012-01-26 (×13): qty 1

## 2012-01-26 MED ORDER — AZITHROMYCIN 500 MG PO TABS
500.0000 mg | ORAL_TABLET | Freq: Every day | ORAL | Status: DC
  Administered 2012-01-26 – 2012-01-29 (×4): 500 mg via ORAL
  Filled 2012-01-26 (×5): qty 1

## 2012-01-26 NOTE — Progress Notes (Signed)
Patient ID: MOOREA BOISSONNEAULT, female   DOB: 16-Aug-1985, 26 y.o.   MRN: [id number]   HD#4  29+1 PPROM  S: Doing well, continues to have LOF.  Denies vaginal bleeding.  Active FM. Rare contractions O:  Filed Vitals:   01/26/12 0740  BP: 83/45  Pulse: 77  Temp: 98.1 F (36.7 C)  Resp: 18  AOx3, NAD Gravid soft NT FHT 140-150 reactive cvx deferred toco quiet  U/S 7/15: vtx, AFI 7.3 (previously 13.5)  A/P: HD#4, PPROM D#3 1) Cont BR w/ BRP 2) FWB reassuring, change monitoring to NST Q shift, toco prn 3) Change abx to po amox & azithro x 5 days. Change to azithro d/t concern for GI intolerence to po erythro. 4) Cont SCDs 5) Have Prenatals faxed over from office

## 2012-01-26 NOTE — Progress Notes (Signed)
UR Chart review completed.  

## 2012-01-27 NOTE — Progress Notes (Signed)
Patient is stable.  Minimal leaking.  Afebrile.  FHT's reactive (monitored 30 minutes t.i.d.).

## 2012-01-28 NOTE — Progress Notes (Signed)
Patient ID: Dana [name], female   DOB: Mar 03, 1986, 26 y.o.   MRN: [id number]  HD#6  PPROM D#5  S: Doing well without complaint.  Continues to leak light pink-tinged fluid. Active FM, denies contractions, no F/C O:  Filed Vitals:   01/27/12 1934 01/27/12 2223 01/28/12 0800 01/28/12 0911  BP: 99/64 106/65  99/69  Pulse: 87 91  86  Temp: 98.5 F (36.9 C) 98.2 F (36.8 C)  98.2 F (36.8 C)  TempSrc: Oral Oral  Oral  Resp: 18 18 18 18   Height:      Weight:       AOX3 Abd soft, NT, ND FHTs NST reactive toco quiet cvx deferred  A/P 1) Cont BR w/ BRP 2) Cont oral antibiotics 3) S/P BMZ 4) Prenatals still not in chart. Were faxed 7/16. Will fax again

## 2012-01-29 NOTE — Progress Notes (Signed)
UR Chart review completed.  

## 2012-01-29 NOTE — Progress Notes (Signed)
Pt has no complaints. States the she has no contractions and that the baby is moving well. Denies F/C. VSSAF Uterus- non tender. PLAN- Will stop p.o. Antibiotics after 5 days which is tomorrow.

## 2012-01-30 NOTE — Progress Notes (Signed)
Pt states that nothing has changed. Only minimal vaginal leakage. Good FM. No contractions Plan/ Will discontinue antibiotics today.

## 2012-02-01 MED ORDER — ONDANSETRON HCL 4 MG PO TABS
4.0000 mg | ORAL_TABLET | Freq: Once | ORAL | Status: AC
  Administered 2012-02-01: 4 mg via ORAL
  Filled 2012-02-01: qty 1

## 2012-02-01 NOTE — Progress Notes (Signed)
No new changes.  Pt denies abd tenderness, fever, VB, ctx.  She reports some continued leaking, +FM, no calf pain.  No complaints. FHT 135 R TOCO absent SVE def HD#10 30.0 weeks with PPROM S/p beta x 2 S/p IV and PO abx Switch to continuous monitoring, RN alerted Cont. Other routine care.

## 2012-02-02 NOTE — Progress Notes (Signed)
Doing well.  Had increased leaking and irregular contractions last night but she states that this morning leaking is less and she is not noticing contractions.   She remains afebrile.  She and her fetus appear stable and she would be more comfortable with intermittent monitoring.

## 2012-02-02 NOTE — Progress Notes (Signed)
SW received consult and met with patient to complete assessment.  SW sees no barriers to d/c and will document full assessment at a later time.

## 2012-02-02 NOTE — Progress Notes (Signed)
UR Chart review completed.  

## 2012-02-03 NOTE — Clinical Social Work Psychosocial (Signed)
    Clinical Social Work Department BRIEF PSYCHOSOCIAL ASSESSMENT 02/02/2012 Late Entry  Patient:  Dana [name], Dana [name]     Account Number:  [id number]     Admit date:  01/23/2012  Clinical Social Worker:  Almeta Monas  Date/Time:  02/02/2012 11:00 AM  Referred by:  RN  Date Referred:  02/02/2012 Referred for  Other - See comment   Other Referral:   financial problems   Interview type:  Patient Other interview type:    PSYCHOSOCIAL DATA Living Status:  HUSBAND Admitted from facility:   Level of care:   Primary support name:  Regina Eck Primary support relationship to patient:  SPOUSE Degree of support available:   husband, sister    CURRENT CONCERNS Current Concerns  Financial Resources   Other Concerns:    SOCIAL WORK ASSESSMENT / PLAN SW met with patient for referral for lack of financial resources.  Patient was pleasant and explained that she has back taxes to pay and other bills that she will not be able to pay since she is in the hospital and out of work.  SW asked if FOB works and she said he is a Therapist, sports and works part time.  They live with her sister and share the rent.  SW explained that unfortunately people find themselves in this situation when they are hospitalized for long periods of time and advised she speak to her support people to come up with a plan to get through this difficult time.  SW suggested that she contact the agencies that she has to pay money to and request extensions given the situation.  SW offered to write a letter verifying her hospitalization if needed.  SW also advised that she cancel any service that is not absolutely mandatory at this time. SW explained that financial resources are extremely limited and there are no funds that SW has to give her.  SW gave her the number to [employer] (211) and [employer] to seek possible assistance.  SW also states that in some instances, [employer] has funds, but this is rare and she must call them to  apply.  SW again offered a letter if needed.   Assessment/plan status:  Information/Referral to [facility] Other assessment/ plan:   Information/referral to community resources:   [employer]  [employer]    PATIENT'S/FAMILY'S RESPONSE TO PLAN OF CARE: Patient was pleasant but not extremely talkative and did not seem to have any type of financial plan other than asking SW for money.  She seemed understanding of the lack of resources and states that she will call for assistance and let SW know if she needs a letter from the SW for any agency.

## 2012-02-04 NOTE — Progress Notes (Signed)
30 3/[redacted] weeks gestation, with Bleeding, PROM.  Height  60 " Weight 144 Lbs pre-pregnancy weight 132 Lbs.Pre-pregnancy  BMI 25.9  IBW 100 Lbs  Total weight gain 12 Lbs. Weight gain goals 15-25 Lbs.   Estimated needs: 1450-1600 kcal/day, 50-60 grams protein/day, 1.8 liters fluid/day Regular diet tolerated well, appetite good. Changed diet order to Antenatal regular Current diet prescription will provide for increased needs. No abnormal nutrition related labs  Noted 7 Lb weight loss from value taken on 7/13. Pt feels as if the discrepancy is due to different scales. Was weighed on a bed scale second time. She reports that she is eating well. Her husband brings dinner. Encouraged po intake, bedtime snack  Nutrition Dx: Increased nutrient needs r/t pregnancy and fetal growth requirements aeb [redacted] weeks gestation.  No educational needs assessed at this time.  Elisabeth Cara M.Odis Luster LDN Neonatal Nutrition Support Specialist Pager [phone]

## 2012-02-04 NOTE — Progress Notes (Signed)
26 y.o. G2P0010 [redacted]w[redacted]d HD#12 admitted for 29WKS BLEEDING.  Pt currently stable with no c/o ctxes.  Good FM.  Filed Vitals:   02/04/12 0155 02/04/12 0414 02/04/12 0520 02/04/12 0701  BP:  107/64    Pulse:  89    Temp:  98.3 F (36.8 C)    TempSrc:  Oral    Resp: 18 18 18 18   Height:      Weight:        Lungs CTA Cor RRR Abd  Soft, gravid, nontender Ex SCDs FHTs  120s, good short term variability, NST R Toco  irreg  No results found for this or any previous visit (from the past 24 hour(s)).  A:  HD#12  [redacted]w[redacted]d with PPROM.  P: S/p bmz.  Continue care.  Ettamae Barkett A

## 2012-02-05 NOTE — Progress Notes (Signed)
UR Chart review completed.  

## 2012-02-05 NOTE — Progress Notes (Signed)
Patient ID: Dana [name], female   DOB: Nov 14, 1985, 26 y.o.   MRN: [id number]  HD#13 S: No complaints O AFVSS Gravid soft NT Cvx deferred toco quiet NST reactive  A/P 1) S/p BMZ 2) S/P IV & PO abx for latency 3) Continue BR with BRP

## 2012-02-05 NOTE — Progress Notes (Signed)
efm dc'd

## 2012-02-06 ENCOUNTER — Inpatient Hospital Stay (HOSPITAL_COMMUNITY): Payer: Medicaid Other

## 2012-02-06 NOTE — Progress Notes (Signed)
HD #14  S: no complaints O: AFVSS Gravid soft NT cvx deferred  NST reactive toco quiet  U/S for EFW completed. Report pending  A/P 1) Cont BR w/ BRP

## 2012-02-07 MED ORDER — FAMOTIDINE 20 MG PO TABS
20.0000 mg | ORAL_TABLET | Freq: Two times a day (BID) | ORAL | Status: DC
  Administered 2012-02-07 – 2012-02-11 (×9): 20 mg via ORAL
  Filled 2012-02-07 (×9): qty 1

## 2012-02-07 NOTE — Progress Notes (Signed)
HD# 15  S: Doing well, noted heard burn last night O: Filed Vitals:   02/07/12 0759  BP: 92/57  Pulse: 92  Temp: 98.2 F (36.8 C)  Resp: 20   AOX3, NAD Gravid soft, NT Cervix deferred FHT NST 140-150s reactive toco quiet  U/S 7/27 EFW 1774 gm (49%), AFI 8.8cm  A/P: 30+6, PPROM 1) Cont BR w/ BRP 2) Add Pepcid for GERD

## 2012-02-08 NOTE — Progress Notes (Signed)
HD#17 PPROM Pt comfortable, no complaints. Denies abd tenderness, fever,  Or bleeding Occ. Ctx, not strong +FM S/p beta x2 and IV/PO abx Cont current mgmt - continous monitoring

## 2012-02-08 NOTE — Progress Notes (Signed)
Ur chart review completed.  

## 2012-02-09 MED ORDER — HYDROCORTISONE 1 % EX CREA
TOPICAL_CREAM | Freq: Three times a day (TID) | CUTANEOUS | Status: DC
  Administered 2012-02-09 – 2012-02-10 (×3): via TOPICAL
  Administered 2012-02-10: 1 via TOPICAL
  Administered 2012-02-11 – 2012-02-12 (×3): via TOPICAL
  Filled 2012-02-09: qty 28

## 2012-02-09 NOTE — Progress Notes (Signed)
02/09/12 1100  Clinical Encounter Type  Visited With Patient  Visit Type Initial;Spiritual support;Social support  Referral From Nurse;Social work  Spiritual Encounters  Spiritual Needs Emotional  Stress Factors  Patient Stress Factors Financial concerns    Visited with Dana [name] to offer spiritual and emotional support.  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  Dana [name] has lived in the [place] ten years (first six months in [place], then here in [place]) and and has been home to [place] once since relocating to [place] (six weeks, last December-January).  She was welcoming of company and encouragement, feeling bored here in hospital.    Provided pastoral listening and reflection.  Pt aware of ongoing chaplain availability.  Will continue to follow for support and encouragement.  [address] [place], [place] [phone]

## 2012-02-09 NOTE — Progress Notes (Signed)
Afebrile, VSS Stable Still notices leaking of fluid and occasional contractions

## 2012-02-10 LAB — URINE MICROSCOPIC-ADD ON

## 2012-02-10 LAB — URINALYSIS, ROUTINE W REFLEX MICROSCOPIC
Glucose, UA: NEGATIVE mg/dL
Nitrite: NEGATIVE
Protein, ur: NEGATIVE mg/dL
Urobilinogen, UA: 0.2 mg/dL (ref 0.0–1.0)

## 2012-02-10 MED ORDER — DIPHENHYDRAMINE HCL 25 MG PO CAPS
25.0000 mg | ORAL_CAPSULE | Freq: Four times a day (QID) | ORAL | Status: DC | PRN
  Administered 2012-02-10: 25 mg via ORAL
  Filled 2012-02-10: qty 1

## 2012-02-10 NOTE — Progress Notes (Signed)
26 y.o. G2P0010 [redacted]w[redacted]d HD#18 admitted for 29WKS BLEEDING.  Pt currently stable with no c/o PPROM.  Good FM.  Occ ctxes.  C/o rash on belly- looks like pupps.  Also c/o dysuria.  Filed Vitals:   02/09/12 1620 02/09/12 2005 02/10/12 0025 02/10/12 0810  BP: 105/67 102/85 97/67 66/43   Pulse: 90 104 94 92  Temp: 98.1 F (36.7 C) 98.2 F (36.8 C) 97.5 F (36.4 C) 98.2 F (36.8 C)  TempSrc:  Oral Oral Oral  Resp: 18 18 18 18   Height:      Weight:        Lungs CTA Cor RRR Abd  Soft, gravid, nontender Ex SCDs FHTs  Yesterday 120s, good short term variability, NST R Toco  q10-20  No results found for this or any previous visit (from the past 24 hour(s)).  A:  HD#18  [redacted]w[redacted]d with PPROM.  P: Afeb and reassuring NST. Continue current care. Check U/A. Cortisone cream and benedryl for rash. Govani Radloff A

## 2012-02-11 LAB — CBC
MCH: 25.2 pg — ABNORMAL LOW (ref 26.0–34.0)
MCV: 77.2 fL — ABNORMAL LOW (ref 78.0–100.0)
Platelets: 199 10*3/uL (ref 150–400)
RDW: 14.7 % (ref 11.5–15.5)

## 2012-02-11 MED ORDER — NIFEDIPINE 10 MG PO CAPS
10.0000 mg | ORAL_CAPSULE | ORAL | Status: DC | PRN
  Administered 2012-02-11 – 2012-02-12 (×4): 10 mg via ORAL
  Filled 2012-02-11 (×4): qty 1

## 2012-02-11 MED ORDER — TERBUTALINE SULFATE 1 MG/ML IJ SOLN
0.2500 mg | Freq: Once | INTRAMUSCULAR | Status: AC
  Administered 2012-02-11: 0.25 mg via SUBCUTANEOUS
  Filled 2012-02-11: qty 1

## 2012-02-11 MED ORDER — SODIUM CHLORIDE 0.9 % IJ SOLN
3.0000 mL | Freq: Two times a day (BID) | INTRAMUSCULAR | Status: DC
  Administered 2012-02-11: 3 mL via INTRAVENOUS

## 2012-02-11 MED ORDER — TERBUTALINE SULFATE 1 MG/ML IJ SOLN
0.2500 mg | Freq: Once | INTRAMUSCULAR | Status: AC
  Administered 2012-02-11: 1 mg via SUBCUTANEOUS
  Filled 2012-02-11: qty 1

## 2012-02-11 NOTE — Progress Notes (Signed)
UR Chart review completed.  

## 2012-02-11 NOTE — Progress Notes (Signed)
Pt had increase in contractions yesterday. Also with some LBP. Had UA checked, negative. Was given Procardia. Still with good FM and small amt of leakage. VSSAF ABD- gravid, non tender Back- no CVAT IMP- Stable PPROM at 31 weeks PLAN/ Will check WBC today.

## 2012-02-11 NOTE — Progress Notes (Signed)
RN reported medication error to MD. MD stated to watch patients heart rate and notify him if it remains high or gets higher as the night goes on

## 2012-02-11 NOTE — Progress Notes (Signed)
Pt stated that she could still fee her heart racing and but that she felt like she could breath fine without effort.  RN told patient that the medication has a 2-4 hours half life and that the medication would start working out of her system in the next hour to 2 hours.  Pt stated that she understood.

## 2012-02-11 NOTE — Progress Notes (Signed)
RN ordered 0.25 mg terbutaline according to MD order by phone.  RN gave 1 mg of terbutaline instead by mistake.  Pt and FOB were talking to RN while drawing up medication and RN was not paying attention to how much was in syringe.  MD called and safety zone portal will be filled out

## 2012-02-11 NOTE — Progress Notes (Signed)
Pt is still contracting every 3-8 mins with a back pain of 6/10 after 1st dose of pracardia. Dr Henderson Cloud notified and order given to administer another dose of 10mg  procardia. Will administer procardia and continue to monitor.

## 2012-02-12 ENCOUNTER — Encounter (HOSPITAL_COMMUNITY): Payer: Self-pay

## 2012-02-12 ENCOUNTER — Inpatient Hospital Stay (HOSPITAL_COMMUNITY): Payer: Medicaid Other | Admitting: Anesthesiology

## 2012-02-12 ENCOUNTER — Encounter (HOSPITAL_COMMUNITY): Payer: Self-pay | Admitting: Anesthesiology

## 2012-02-12 ENCOUNTER — Inpatient Hospital Stay (HOSPITAL_COMMUNITY): Payer: Medicaid Other

## 2012-02-12 LAB — CBC
HCT: 39.2 % (ref 36.0–46.0)
Hemoglobin: 13.2 g/dL (ref 12.0–15.0)
RBC: 5.13 MIL/uL — ABNORMAL HIGH (ref 3.87–5.11)
RDW: 14.8 % (ref 11.5–15.5)
WBC: 22.3 10*3/uL — ABNORMAL HIGH (ref 4.0–10.5)

## 2012-02-12 LAB — TYPE AND SCREEN
ABO/RH(D): B POS
Antibody Screen: NEGATIVE

## 2012-02-12 MED ORDER — DIPHENHYDRAMINE HCL 50 MG/ML IJ SOLN: 12.5000 mg | INTRAMUSCULAR | Status: DC | PRN

## 2012-02-12 MED ORDER — FENTANYL 2.5 MCG/ML BUPIVACAINE 1/10 % EPIDURAL INFUSION (WH - ANES)
14.0000 mL/h | INTRAMUSCULAR | Status: DC
  Filled 2012-02-12: qty 60

## 2012-02-12 MED ORDER — WITCH HAZEL-GLYCERIN EX PADS: 1.0000 "application " | MEDICATED_PAD | CUTANEOUS | Status: DC | PRN

## 2012-02-12 MED ORDER — TETANUS-DIPHTH-ACELL PERTUSSIS 5-2.5-18.5 LF-MCG/0.5 IM SUSP
0.5000 mL | Freq: Once | INTRAMUSCULAR | Status: AC
  Administered 2012-02-13: 0.5 mL via INTRAMUSCULAR
  Filled 2012-02-12: qty 0.5

## 2012-02-12 MED ORDER — FLEET ENEMA 7-19 GM/118ML RE ENEM: 1.0000 | ENEMA | RECTAL | Status: DC | PRN

## 2012-02-12 MED ORDER — TERBUTALINE SULFATE 1 MG/ML IJ SOLN
0.2500 mg | Freq: Once | INTRAMUSCULAR | Status: AC
  Administered 2012-02-12: 0.25 mg via SUBCUTANEOUS
  Filled 2012-02-12: qty 1

## 2012-02-12 MED ORDER — LACTATED RINGERS IV SOLN: 500.0000 mL | INTRAVENOUS | Status: DC | PRN

## 2012-02-12 MED ORDER — LACTATED RINGERS IV SOLN
500.0000 mL | Freq: Once | INTRAVENOUS | Status: AC
  Administered 2012-02-12: 10:00:00 via INTRAVENOUS

## 2012-02-12 MED ORDER — ACETAMINOPHEN 325 MG PO TABS: 650.0000 mg | ORAL_TABLET | ORAL | Status: DC | PRN

## 2012-02-12 MED ORDER — LIDOCAINE HCL (PF) 1 % IJ SOLN
30.0000 mL | INTRAMUSCULAR | Status: DC | PRN
  Filled 2012-02-12: qty 30

## 2012-02-12 MED ORDER — LANOLIN HYDROUS EX OINT: TOPICAL_OINTMENT | CUTANEOUS | Status: DC | PRN

## 2012-02-12 MED ORDER — OXYCODONE-ACETAMINOPHEN 5-325 MG PO TABS: 1.0000 | ORAL_TABLET | ORAL | Status: DC | PRN

## 2012-02-12 MED ORDER — OXYTOCIN BOLUS FROM INFUSION
250.0000 mL | Freq: Once | INTRAVENOUS | Status: DC
  Filled 2012-02-12: qty 500

## 2012-02-12 MED ORDER — SIMETHICONE 80 MG PO CHEW: 80.0000 mg | CHEWABLE_TABLET | ORAL | Status: DC | PRN

## 2012-02-12 MED ORDER — FENTANYL 2.5 MCG/ML BUPIVACAINE 1/10 % EPIDURAL INFUSION (WH - ANES)
INTRAMUSCULAR | Status: DC | PRN
  Administered 2012-02-12: 12 mL/h via EPIDURAL

## 2012-02-12 MED ORDER — LACTATED RINGERS IV SOLN
INTRAVENOUS | Status: DC
  Administered 2012-02-12: 11:00:00 via INTRAVENOUS

## 2012-02-12 MED ORDER — PHENYLEPHRINE 40 MCG/ML (10ML) SYRINGE FOR IV PUSH (FOR BLOOD PRESSURE SUPPORT)
80.0000 ug | PREFILLED_SYRINGE | INTRAVENOUS | Status: DC | PRN
  Filled 2012-02-12: qty 5

## 2012-02-12 MED ORDER — NALBUPHINE SYRINGE 5 MG/0.5 ML
5.0000 mg | INJECTION | INTRAMUSCULAR | Status: DC | PRN
  Administered 2012-02-12 (×2): 5 mg via INTRAVENOUS
  Filled 2012-02-12 (×2): qty 0.5

## 2012-02-12 MED ORDER — ONDANSETRON HCL 4 MG/2ML IJ SOLN: 4.0000 mg | Freq: Four times a day (QID) | INTRAMUSCULAR | Status: DC | PRN

## 2012-02-12 MED ORDER — DIBUCAINE 1 % RE OINT: 1.0000 "application " | TOPICAL_OINTMENT | RECTAL | Status: DC | PRN

## 2012-02-12 MED ORDER — EPHEDRINE 5 MG/ML INJ: 10.0000 mg | INTRAVENOUS | Status: DC | PRN

## 2012-02-12 MED ORDER — SENNOSIDES-DOCUSATE SODIUM 8.6-50 MG PO TABS
2.0000 | ORAL_TABLET | Freq: Every day | ORAL | Status: DC
  Administered 2012-02-12 – 2012-02-13 (×2): 2 via ORAL

## 2012-02-12 MED ORDER — DIPHENHYDRAMINE HCL 25 MG PO CAPS: 25.0000 mg | ORAL_CAPSULE | Freq: Four times a day (QID) | ORAL | Status: DC | PRN

## 2012-02-12 MED ORDER — IBUPROFEN 600 MG PO TABS: 600.0000 mg | ORAL_TABLET | Freq: Four times a day (QID) | ORAL | Status: DC | PRN

## 2012-02-12 MED ORDER — PHENYLEPHRINE 40 MCG/ML (10ML) SYRINGE FOR IV PUSH (FOR BLOOD PRESSURE SUPPORT): 80.0000 ug | PREFILLED_SYRINGE | INTRAVENOUS | Status: DC | PRN

## 2012-02-12 MED ORDER — PENICILLIN G POTASSIUM 5000000 UNITS IJ SOLR
2.5000 10*6.[IU] | INTRAVENOUS | Status: DC
  Administered 2012-02-12 (×2): 2.5 10*6.[IU] via INTRAVENOUS
  Filled 2012-02-12 (×5): qty 2.5

## 2012-02-12 MED ORDER — PRENATAL MULTIVITAMIN CH
1.0000 | ORAL_TABLET | Freq: Every day | ORAL | Status: DC
  Administered 2012-02-12 – 2012-02-14 (×3): 1 via ORAL
  Filled 2012-02-12 (×3): qty 1

## 2012-02-12 MED ORDER — IBUPROFEN 600 MG PO TABS
600.0000 mg | ORAL_TABLET | Freq: Four times a day (QID) | ORAL | Status: DC
  Administered 2012-02-12 – 2012-02-14 (×7): 600 mg via ORAL
  Filled 2012-02-12 (×7): qty 1

## 2012-02-12 MED ORDER — OXYCODONE-ACETAMINOPHEN 5-325 MG PO TABS
1.0000 | ORAL_TABLET | ORAL | Status: DC | PRN
  Administered 2012-02-13 (×3): 1 via ORAL
  Filled 2012-02-12 (×3): qty 1

## 2012-02-12 MED ORDER — LIDOCAINE HCL (PF) 1 % IJ SOLN
INTRAMUSCULAR | Status: DC | PRN
  Administered 2012-02-12 (×2): 5 mL

## 2012-02-12 MED ORDER — ONDANSETRON HCL 4 MG PO TABS: 4.0000 mg | ORAL_TABLET | ORAL | Status: DC | PRN

## 2012-02-12 MED ORDER — CITRIC ACID-SODIUM CITRATE 334-500 MG/5ML PO SOLN: 30.0000 mL | ORAL | Status: DC | PRN

## 2012-02-12 MED ORDER — ZOLPIDEM TARTRATE 5 MG PO TABS: 5.0000 mg | ORAL_TABLET | Freq: Every evening | ORAL | Status: DC | PRN

## 2012-02-12 MED ORDER — BENZOCAINE-MENTHOL 20-0.5 % EX AERO
1.0000 "application " | INHALATION_SPRAY | CUTANEOUS | Status: DC | PRN
  Administered 2012-02-12: 1 via TOPICAL
  Filled 2012-02-12: qty 56

## 2012-02-12 MED ORDER — EPHEDRINE 5 MG/ML INJ
10.0000 mg | INTRAVENOUS | Status: DC | PRN
  Filled 2012-02-12: qty 4

## 2012-02-12 MED ORDER — PENICILLIN G POTASSIUM 5000000 UNITS IJ SOLR
5.0000 10*6.[IU] | Freq: Once | INTRAVENOUS | Status: AC
  Administered 2012-02-12: 5 10*6.[IU] via INTRAVENOUS
  Filled 2012-02-12: qty 5

## 2012-02-12 MED ORDER — ONDANSETRON HCL 4 MG/2ML IJ SOLN: 4.0000 mg | INTRAMUSCULAR | Status: DC | PRN

## 2012-02-12 MED ORDER — OXYTOCIN 40 UNITS IN LACTATED RINGERS INFUSION - SIMPLE MED
62.5000 mL/h | Freq: Once | INTRAVENOUS | Status: AC
  Administered 2012-02-12: 62.5 mL/h via INTRAVENOUS
  Filled 2012-02-12: qty 1000

## 2012-02-12 NOTE — Plan of Care (Signed)
Problem: Consults Goal: Birthing Suites Patient Information Press F2 to bring up selections list  Outcome: Completed/Met Date Met:  02/12/12  Pt < [redacted] weeks EGA and Antenatal Patient (< 37 weeks)

## 2012-02-12 NOTE — Progress Notes (Signed)
Pt had an increase in amount of contractions in last 24 hours. Also pulse has increased.  Still remains afebrile and uterus not tender between contractions. FHTS - not tachycardic.  Was given terb and procardia yesterday. WBC yesterday - 16K. IMP/ Pt may be developing chorio. Plan/ Will transfer to L&D for delivery.

## 2012-02-12 NOTE — Progress Notes (Signed)
RN asked patient what she wanted for pain and patient stated that she wanted to try to sleep.  RN offered and Remus Loffler and a procaridia to help with sleep and to slow contractions.  Patient stated that she wanted to try that before pain medication

## 2012-02-12 NOTE — Progress Notes (Signed)
1300 pt had redness on abdomen in shape of toco and [place] cable.  Pt states this is the usual reaction when she is monitored for a long time.  Pt states she does not want to use hydrocortisone creme until after delivery.  Gel replaced for lotion.  Pt reports itching at the sites.

## 2012-02-12 NOTE — Anesthesia Procedure Notes (Addendum)
Epidural Patient location during procedure: OB Start time: 02/12/2012 10:22 AM  Staffing Anesthesiologist: Brayton Caves R Performed by: anesthesiologist   Preanesthetic Checklist Completed: patient identified, site marked, surgical consent, pre-op evaluation, timeout performed, IV checked, risks and benefits discussed and monitors and equipment checked  Epidural Patient position: sitting Prep: site prepped and draped and DuraPrep Patient monitoring: continuous pulse ox and blood pressure Approach: midline Injection technique: LOR air  Needle:  Needle type: Tuohy  Needle gauge: 17 G Needle length: 9 cm Needle insertion depth: 6 cm Catheter type: closed end flexible Catheter size: 19 Gauge Catheter at skin depth: 11 cm Test dose: negative  Assessment Events: blood not aspirated, injection not painful, no injection resistance, negative IV test and no paresthesia  Additional Notes Patient identified.  Risk benefits discussed including failed block, incomplete pain control, headache, nerve damage, paralysis, blood pressure changes, nausea, vomiting, reactions to medication both toxic or allergic, and postpartum back pain.  Patient expressed understanding and wished to proceed.  All questions were answered.  Sterile technique used throughout procedure and epidural site dressed with sterile barrier dressing. No paresthesia or other complications noted.The patient did not experience any signs of intravascular injection such as tinnitus or metallic taste in mouth nor signs of intrathecal spread such as rapid motor block. Please see nursing notes for vital signs. Due to computer issues I entered this note for my partner Lewie Loron.  He performed the procedure.

## 2012-02-12 NOTE — Progress Notes (Signed)
MD called and asked RN to transfer patient to labor and delivery if contractions continue and to order and [place] to confirm presentation if patient is transferred.

## 2012-02-12 NOTE — Progress Notes (Signed)
MD updated on pt status.  RN told MD that she had already procardia.  MD gave orders for one more dose of terbutaline. If patient continues to contract then patient will be transferred to birthing suites

## 2012-02-12 NOTE — Anesthesia Preprocedure Evaluation (Signed)
Anesthesia Evaluation  Patient identified by MRN, date of birth, ID band Patient awake    Reviewed: Allergy & Precautions, H&P , Patient's Chart, lab work & pertinent test results  Airway Mallampati: II TM Distance: >3 FB Neck ROM: full    Dental No notable dental hx.    Pulmonary neg pulmonary ROS,  breath sounds clear to auscultation  Pulmonary exam normal       Cardiovascular negative cardio ROS  Rhythm:regular Rate:Normal     Neuro/Psych negative neurological ROS  negative psych ROS   GI/Hepatic negative GI ROS, Neg liver ROS, (+) Hepatitis -, B  Endo/Other  negative endocrine ROS  Renal/GU negative Renal ROS     Musculoskeletal   Abdominal   Peds  Hematology negative hematology ROS (+)   Anesthesia Other Findings   Reproductive/Obstetrics (+) Pregnancy                          Anesthesia Physical Anesthesia Plan  ASA: III  Anesthesia Plan: Epidural   Post-op Pain Management:    Induction:   Airway Management Planned:   Additional Equipment:   Intra-op Plan:   Post-operative Plan:   Informed Consent: I have reviewed the patients History and Physical, chart, labs and discussed the procedure including the risks, benefits and alternatives for the proposed anesthesia with the patient or authorized representative who has indicated his/her understanding and acceptance.     Plan Discussed with:   Anesthesia Plan Comments:        Anesthesia Quick Evaluation  

## 2012-02-13 LAB — CBC
MCH: 25.2 pg — ABNORMAL LOW (ref 26.0–34.0)
Platelets: 195 10*3/uL (ref 150–400)
RBC: 5.27 MIL/uL — ABNORMAL HIGH (ref 3.87–5.11)
WBC: 16.3 10*3/uL — ABNORMAL HIGH (ref 4.0–10.5)

## 2012-02-13 NOTE — Progress Notes (Signed)
Post Partum Day 1 Subjective: no complaints  Objective: Blood pressure 91/51, pulse 72, temperature 98.1 F (36.7 C), temperature source Oral, resp. rate 18, height 5' (1.524 m), weight 69.4 kg (153 lb), last menstrual period 06/28/2011, SpO2 97.00%, unknown if currently breastfeeding.  Physical Exam:  General: alert Lochia: appropriate Uterine Fundus: firm   Basename 02/13/12 0500 02/12/12 0745  HGB 13.3 13.2  HCT 40.7 39.2    Assessment/Plan: Plan for discharge tomorrow.  Baby doing well on room air in NICU   LOS: 21 days   Rayquon Uselman D 02/13/2012, 9:35 AM

## 2012-02-14 NOTE — Discharge Summary (Signed)
Obstetric Discharge Summary Reason for Admission: PPROM Prenatal Procedures: ultrasound Intrapartum Procedures: spontaneous vaginal delivery, episiotomy midline and hospital observation Postpartum Procedures: none Complications-Operative and Postpartum: none Hemoglobin  Date Value Range Status  02/13/2012 13.3  12.0 - 15.0 g/dL Final  4/0/9811 91.4   Final     HCT  Date Value Range Status  02/13/2012 40.7  36.0 - 46.0 % Final  01/11/2012 40   Final    Physical Exam:  General: alert Lochia: appropriate Uterine Fundus: firm  Discharge Diagnoses: PPROM x3 weeks, Preterm labor and delivery  Discharge Information: Date: 02/14/2012 Activity: pelvic rest Diet: routine Medications: PNV and Ibuprofen Condition: stable Instructions: refer to practice specific booklet Discharge to: home Follow-up Information    Follow up with Mickel Baas, MD in 4 weeks.   Contact information:   [address] [place] [address] [phone]          Newborn Data: Live born female  Birth Weight: 3 lb 12.3 oz (1710 g) APGAR: 8, 8  Baby doing well in NICU  Zaccary Creech D 02/14/2012, 9:47 AM

## 2012-02-14 NOTE — Progress Notes (Signed)
Discharge instructions reviewed with patient.  Patient able to " Teach Back" home care and signs/symptoms to report to MD.  No home equipment needed.  Ambulated to car with staff without incident and discharged to care of family. 

## 2012-03-10 ENCOUNTER — Ambulatory Visit: Payer: Self-pay | Admitting: Gastroenterology

## 2013-03-15 DIAGNOSIS — B181 Chronic viral hepatitis B without delta-agent: Secondary | ICD-10-CM | POA: Insufficient documentation

## 2013-04-10 ENCOUNTER — Other Ambulatory Visit: Payer: Self-pay

## 2013-04-13 ENCOUNTER — Inpatient Hospital Stay (HOSPITAL_COMMUNITY): Payer: No Typology Code available for payment source

## 2013-04-13 ENCOUNTER — Encounter (HOSPITAL_COMMUNITY): Payer: Self-pay | Admitting: *Deleted

## 2013-04-13 ENCOUNTER — Inpatient Hospital Stay (HOSPITAL_COMMUNITY)
Admission: AD | Admit: 2013-04-13 | Discharge: 2013-04-13 | Disposition: A | Discharge status: Home / Self Care | Payer: No Typology Code available for payment source | Source: Ambulatory Visit | Attending: Obstetrics and Gynecology | Admitting: Obstetrics and Gynecology

## 2013-04-13 DIAGNOSIS — O021 Missed abortion: Secondary | ICD-10-CM | POA: Insufficient documentation

## 2013-04-13 DIAGNOSIS — R109 Unspecified abdominal pain: Secondary | ICD-10-CM | POA: Insufficient documentation

## 2013-04-13 LAB — URINALYSIS, ROUTINE W REFLEX MICROSCOPIC
Ketones, ur: NEGATIVE mg/dL
Leukocytes, UA: NEGATIVE
Nitrite: NEGATIVE
Specific Gravity, Urine: 1.025 (ref 1.005–1.030)
Urobilinogen, UA: 0.2 mg/dL (ref 0.0–1.0)
pH: 8.5 — ABNORMAL HIGH (ref 5.0–8.0)

## 2013-04-13 LAB — URINE MICROSCOPIC-ADD ON

## 2013-04-13 MED ORDER — KETOROLAC TROMETHAMINE 60 MG/2ML IM SOLN
INTRAMUSCULAR | Status: AC
  Filled 2013-04-13: qty 2

## 2013-04-13 MED ORDER — KETOROLAC TROMETHAMINE 60 MG/2ML IM SOLN
60.0000 mg | Freq: Once | INTRAMUSCULAR | Status: AC
  Administered 2013-04-13: 60 mg via INTRAMUSCULAR

## 2013-04-13 NOTE — MAU Provider Note (Signed)
History     CSN: [id number]  Arrival date and time: 04/13/13 1403   First Provider Initiated Contact with Patient 04/13/13 1628      Chief Complaint  Patient presents with  . Nausea  . Abdominal Pain   HPI  VERNECIA UMBLE is a 27 y.o. [id number] at Unknown who presents today with abdominal pain. She states she was seen in the office on Monday, and was told the baby did not have a heartbeat. She states that she was given cytotec rx, and self administered cytotec vaginally. She states that she has had very minimal spotting, and did not pass any clots or tissue. She states that she continues to have abdominal pain and nausea.   She states that she last ate at 11:00 today, and she had rice, pork and cabbage. She last drank sprite at that time too.   Past Medical History  Diagnosis Date  . Positive PPD   . 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 Abortion history     Past Surgical History  Procedure Laterality Date  . No past surgeries      Family History  Problem Relation Age of Onset  . Hypertension Father   . Anesthesia problems Neg Hx     History  Substance Use Topics  . Smoking status: Never Smoker   . Smokeless tobacco: Never Used  . Alcohol Use: No    Allergies: No Known Allergies  Prescriptions prior to admission  Medication Sig Dispense Refill  . Prenatal Vit-Fe Fumarate-FA (PRENATAL MULTIVITAMIN) TABS Take 1 tablet by mouth daily.        ROS Physical Exam   Blood pressure 118/74, pulse 93, temperature 98 F (36.7 C), temperature source Oral, resp. rate 16, height 5' (1.524 m), weight 60.419 kg (133 lb 3.2 oz), last menstrual period 03/09/2013, SpO2 100.00%.  Physical Exam  Nursing note and vitals reviewed. Constitutional: She is oriented to person, place, and time. She appears well-developed and well-nourished. No distress.  Cardiovascular: Normal rate.   Respiratory: Effort normal.  GI: Soft.  Genitourinary:   External: no lesion Vagina: small amount of white  discharge Cervix: pink, smooth, no CMT, ? Tissue at the os, unable to remove the tissue easily.  Uterus: slightly enlarged.     Neurological: She is alert and oriented to person, place, and time.  Skin: Skin is warm and dry.  Psychiatric: She has a normal mood and affect.    MAU Course  Procedures  US Ob Comp Less 14 Wks  04/13/2013   CLINICAL DATA:  Missed abortion. Persistent bleeding after satisfactory. Evaluate for retained products of conception.  EXAM: OBSTETRIC <14 WK ULTRASOUND  TECHNIQUE: Transabdominal ultrasound was performed for evaluation of the gestation as well as the maternal uterus and adnexal regions.  COMPARISON:  None.  FINDINGS: Intrauterine gestational sac: Single irregular sac visualized.  Yolk sac:  Not visualized  Embryo:  Visualized  Cardiac Activity: Absent  CRL:   16  mm   8 w 0 d  Maternal uterus/adnexae: Normal ovaries. No adnexal mass or free fluid visualized.  IMPRESSION: Persistent failed IUP seen, consistent with missed abortion.   Electronically Signed   By: Myles Rosenthal   On: 04/13/2013 17:24      1642: D/W Dr. Henderson Cloud, will repeat the ultrasound. If still products there plan top give cytotec here in MAU and observe the patient for a short time. Will call her back with results.   1748: Patient's preference would be to have  a D&E. She does not feel comfortable trying the medication again because it did not work the first time.  1800: Dr. Henderson Cloud D/W OR unscheduled cases occuring right now. Will schedule for first thing in the morning. DC home with toradol, and have her be NPO. Will call with surgery time.   Assessment and Plan   1. Missed abortion   Failed cytotec attempt Will schedule D&E for first thing tomorrow Patient will await phone call with the time Will be NPO after MN.   Tawnya Crook 04/13/2013, 4:29 PM

## 2013-04-13 NOTE — Progress Notes (Signed)
Pt vomited this morning.

## 2013-04-13 NOTE — MAU Note (Signed)
Pt complains of lower abdominal pain (rates pain 5 out of 10) and nausea that started yesterday morning.

## 2013-04-13 NOTE — MAU Note (Signed)
Patient states she was seen in the office on Monday. Had no heart beat in the office and was given vaginal Cytotec. Had a small clot with only slight bleeding, but has abdominal cramping and feels nauseated.

## 2013-04-13 NOTE — MAU Note (Signed)
Pt says she miscarried Monday and was given cytotec. She is only spotting now.

## 2013-04-14 ENCOUNTER — Encounter (HOSPITAL_COMMUNITY): Payer: Self-pay | Admitting: Anesthesiology

## 2013-04-14 ENCOUNTER — Encounter (HOSPITAL_COMMUNITY): Payer: Self-pay | Admitting: *Deleted

## 2013-04-14 ENCOUNTER — Ambulatory Visit (HOSPITAL_COMMUNITY)
Admission: RE | Admit: 2013-04-14 | Discharge: 2013-04-14 | Disposition: A | Discharge status: Home / Self Care | Payer: No Typology Code available for payment source | Source: Ambulatory Visit | Attending: Obstetrics and Gynecology | Admitting: Obstetrics and Gynecology

## 2013-04-14 ENCOUNTER — Ambulatory Visit (HOSPITAL_COMMUNITY): Payer: No Typology Code available for payment source | Admitting: Anesthesiology

## 2013-04-14 ENCOUNTER — Encounter (HOSPITAL_COMMUNITY)
Admission: RE | Disposition: A | Discharge status: Home / Self Care | Payer: Self-pay | Source: Ambulatory Visit | Attending: Obstetrics and Gynecology

## 2013-04-14 DIAGNOSIS — Z9889 Other specified postprocedural states: Secondary | ICD-10-CM

## 2013-04-14 DIAGNOSIS — O021 Missed abortion: Secondary | ICD-10-CM | POA: Insufficient documentation

## 2013-04-14 HISTORY — PX: DILATION AND EVACUATION: SHX1459

## 2013-04-14 LAB — CBC
HCT: 40.2 % (ref 36.0–46.0)
Hemoglobin: 13.5 g/dL (ref 12.0–15.0)
MCH: 25.8 pg — ABNORMAL LOW (ref 26.0–34.0)
RBC: 5.24 MIL/uL — ABNORMAL HIGH (ref 3.87–5.11)
WBC: 7.7 10*3/uL (ref 4.0–10.5)

## 2013-04-14 SURGERY — DILATION AND EVACUATION, UTERUS
Anesthesia: General | Site: Vagina | Wound class: Clean Contaminated

## 2013-04-14 MED ORDER — METOCLOPRAMIDE HCL 5 MG/ML IJ SOLN: 10.0000 mg | Freq: Once | INTRAMUSCULAR | Status: DC | PRN

## 2013-04-14 MED ORDER — LACTATED RINGERS IV SOLN
INTRAVENOUS | Status: DC
  Administered 2013-04-14 (×2): via INTRAVENOUS

## 2013-04-14 MED ORDER — FENTANYL CITRATE 0.05 MG/ML IJ SOLN
INTRAMUSCULAR | Status: DC | PRN
  Administered 2013-04-14 (×2): 50 ug via INTRAVENOUS

## 2013-04-14 MED ORDER — DEXAMETHASONE SODIUM PHOSPHATE 10 MG/ML IJ SOLN
INTRAMUSCULAR | Status: AC
  Filled 2013-04-14: qty 1

## 2013-04-14 MED ORDER — PROPOFOL 10 MG/ML IV EMUL
INTRAVENOUS | Status: AC
  Filled 2013-04-14: qty 20

## 2013-04-14 MED ORDER — METHYLERGONOVINE MALEATE 0.2 MG/ML IJ SOLN
INTRAMUSCULAR | Status: AC
  Filled 2013-04-14: qty 1

## 2013-04-14 MED ORDER — GLYCOPYRROLATE 0.2 MG/ML IJ SOLN
INTRAMUSCULAR | Status: DC | PRN
  Administered 2013-04-14: 0.2 mg via INTRAVENOUS

## 2013-04-14 MED ORDER — LIDOCAINE HCL (CARDIAC) 20 MG/ML IV SOLN
INTRAVENOUS | Status: AC
  Filled 2013-04-14: qty 5

## 2013-04-14 MED ORDER — LIDOCAINE HCL (CARDIAC) 20 MG/ML IV SOLN
INTRAVENOUS | Status: DC | PRN
  Administered 2013-04-14: 20 mg via INTRAVENOUS

## 2013-04-14 MED ORDER — ONDANSETRON HCL 4 MG/2ML IJ SOLN
INTRAMUSCULAR | Status: DC | PRN
  Administered 2013-04-14: 4 mg via INTRAVENOUS

## 2013-04-14 MED ORDER — ONDANSETRON HCL 4 MG/2ML IJ SOLN
INTRAMUSCULAR | Status: AC
  Filled 2013-04-14: qty 2

## 2013-04-14 MED ORDER — MEPERIDINE HCL 25 MG/ML IJ SOLN: 6.2500 mg | INTRAMUSCULAR | Status: DC | PRN

## 2013-04-14 MED ORDER — MIDAZOLAM HCL 2 MG/2ML IJ SOLN
INTRAMUSCULAR | Status: AC
  Filled 2013-04-14: qty 2

## 2013-04-14 MED ORDER — GLYCOPYRROLATE 0.2 MG/ML IJ SOLN
INTRAMUSCULAR | Status: AC
  Filled 2013-04-14: qty 1

## 2013-04-14 MED ORDER — MIDAZOLAM HCL 2 MG/2ML IJ SOLN
INTRAMUSCULAR | Status: DC | PRN
  Administered 2013-04-14: 2 mg via INTRAVENOUS

## 2013-04-14 MED ORDER — FENTANYL CITRATE 0.05 MG/ML IJ SOLN
INTRAMUSCULAR | Status: AC
  Filled 2013-04-14: qty 2

## 2013-04-14 MED ORDER — DEXAMETHASONE SODIUM PHOSPHATE 10 MG/ML IJ SOLN
INTRAMUSCULAR | Status: DC | PRN
  Administered 2013-04-14: 10 mg via INTRAVENOUS

## 2013-04-14 MED ORDER — PROPOFOL 10 MG/ML IV EMUL
INTRAVENOUS | Status: DC | PRN
  Administered 2013-04-14: 200 mg via INTRAVENOUS

## 2013-04-14 MED ORDER — METHYLERGONOVINE MALEATE 0.2 MG/ML IJ SOLN
INTRAMUSCULAR | Status: DC | PRN
  Administered 2013-04-14: 0.2 mg via INTRAMUSCULAR

## 2013-04-14 MED ORDER — FENTANYL CITRATE 0.05 MG/ML IJ SOLN: 25.0000 ug | INTRAMUSCULAR | Status: DC | PRN

## 2013-04-14 SURGICAL SUPPLY — 18 items
CATH ROBINSON RED A/P 16FR (CATHETERS) ×2 IMPLANT
CLOTH BEACON ORANGE TIMEOUT ST (SAFETY) ×2 IMPLANT
DECANTER SPIKE VIAL GLASS SM (MISCELLANEOUS) ×2 IMPLANT
GLOVE BIO SURGEON STRL SZ7 (GLOVE) ×3 IMPLANT
GLOVE SURG SS PI 7.0 STRL IVOR (GLOVE) ×2 IMPLANT
GLOVE SURG SS PI 7.5 STRL IVOR (GLOVE) ×2 IMPLANT
GOWN STRL REIN XL XLG (GOWN DISPOSABLE) ×3 IMPLANT
KIT BERKELEY 1ST TRIMESTER 3/8 (MISCELLANEOUS) ×2 IMPLANT
NS IRRIG 1000ML POUR BTL (IV SOLUTION) ×2 IMPLANT
PACK VAGINAL MINOR WOMEN LF (CUSTOM PROCEDURE TRAY) ×2 IMPLANT
PAD OB MATERNITY 4.3X12.25 (PERSONAL CARE ITEMS) ×2 IMPLANT
PAD PREP 24X48 CUFFED NSTRL (MISCELLANEOUS) ×2 IMPLANT
SET BERKELEY SUCTION TUBING (SUCTIONS) ×2 IMPLANT
TOWEL OR 17X24 6PK STRL BLUE (TOWEL DISPOSABLE) ×3 IMPLANT
VACURETTE 10 RIGID CVD (CANNULA) IMPLANT
VACURETTE 7MM CVD STRL WRAP (CANNULA) IMPLANT
VACURETTE 8 RIGID CVD (CANNULA) IMPLANT
VACURETTE 9 RIGID CVD (CANNULA) IMPLANT

## 2013-04-14 NOTE — Anesthesia Postprocedure Evaluation (Signed)
Anesthesia Post Note  Patient: Dana [name]  Procedure(s) Performed: Procedure(s) (LRB): DILATATION AND EVACUATION (N/A)  Anesthesia type: General  Patient location: PACU  Post pain: Pain level controlled  Post assessment: Post-op Vital signs reviewed  Last Vitals:  Filed Vitals:   04/14/13 1300  BP: 116/67  Pulse: 73  Temp:   Resp: 19    Post vital signs: Reviewed  Level of consciousness: sedated  Complications: No apparent anesthesia complications

## 2013-04-14 NOTE — Anesthesia Procedure Notes (Signed)
Procedure Name: LMA Insertion Date/Time: 04/14/2013 12:25 PM Performed by: Lincoln Brigham Pre-anesthesia Checklist: Patient identified, Patient being monitored, Emergency Drugs available and Suction available Patient Re-evaluated:Patient Re-evaluated prior to inductionOxygen Delivery Method: Circle system utilized Preoxygenation: Pre-oxygenation with 100% oxygen Intubation Type: IV induction Ventilation: Mask ventilation without difficulty LMA: LMA inserted LMA Size: 3.0 Number of attempts: 1 Dental Injury: Teeth and Oropharynx as per pre-operative assessment

## 2013-04-14 NOTE — Brief Op Note (Signed)
04/14/2013  12:47 PM  PATIENT:  Dana [name]  27 y.o. female  PRE-OPERATIVE DIAGNOSIS:  missed ab  POST-OPERATIVE DIAGNOSIS:  missed ab  PROCEDURE:  Procedure(s): DILATATION AND EVACUATION (N/A)  SURGEON:  Surgeon(s) and Role:    * Loney Laurence, MD - Primary   ANESTHESIA:   general  EBL:  Total I/O In: 1700 [I.V.:1700] Out: 400 [Urine:100; Blood:300]   LOCAL MEDICATIONS USED:  NONE  SPECIMEN:  Source of Specimen:  uterine currettings  DISPOSITION OF SPECIMEN:  PATHOLOGY  COUNTS:  YES  TOURNIQUET:  * No tourniquets in log *  DICTATION: .Note written in EPIC  PLAN OF CARE: Discharge to home after PACU  PATIENT DISPOSITION:  PACU - hemodynamically stable.   Delay start of Pharmacological VTE agent (>24hrs) due to surgical blood loss or risk of bleeding: not applicable  Medications: Methergine  Complications: None  Findings:  10 week size uterus to 8 size post procedure.  Good crie was achieved.  After adequate anesthesia was achieved, the patient was prepped and draped in the usual sterile fashion.  The speculum was placed in the vagina and the cervix stabilized with a single-tooth tenaculum.  The cervix was dilated with Shawnie Pons dilators and the 9 and then 12 mm curette was used to remove contents of the uterus.  Alternating sharp curettage with a curette and suction curettage was performed until all contents were removed and good crie was achieved.  All instruments were removed from the vagina.  The patient tolerated the procedure well.    Aedon Deason A

## 2013-04-14 NOTE — Transfer of Care (Signed)
Immediate Anesthesia Transfer of Care Note  Patient: Dana [name]  Procedure(s) Performed: Procedure(s): DILATATION AND EVACUATION (N/A)  Patient Location: PACU  Anesthesia Type:General  Level of Consciousness: awake, alert  and oriented  Airway & Oxygen Therapy: Patient Spontanous Breathing and Patient connected to nasal cannula oxygen  Post-op Assessment: Report given to PACU RN and Post -op Vital signs reviewed and stable  Post vital signs: Reviewed and stable  Complications: No apparent anesthesia complications

## 2013-04-14 NOTE — Anesthesia Preprocedure Evaluation (Signed)
Anesthesia Evaluation  Patient identified by MRN, date of birth, ID band Patient awake    Reviewed: Allergy & Precautions, H&P , NPO status , Patient's Chart, lab work & pertinent test results  Airway Mallampati: II TM Distance: >3 FB Neck ROM: Full    Dental no notable dental hx. (+) Teeth Intact   Pulmonary  PPD (+) breath sounds clear to auscultation  Pulmonary exam normal       Cardiovascular negative cardio ROS  Rhythm:Regular Rate:Normal     Neuro/Psych negative neurological ROS  negative psych ROS   GI/Hepatic negative GI ROS, (+) Hepatitis -, B  Endo/Other  negative endocrine ROS  Renal/GU negative Renal ROS  negative genitourinary   Musculoskeletal negative musculoskeletal ROS (+)   Abdominal   Peds  Hematology negative hematology ROS (+)   Anesthesia Other Findings   Reproductive/Obstetrics (+) Pregnancy Missed Ab                           Anesthesia Physical Anesthesia Plan  ASA: II  Anesthesia Plan: General   Post-op Pain Management:    Induction: Intravenous  Airway Management Planned: LMA  Additional Equipment:   Intra-op Plan:   Post-operative Plan: Extubation in OR  Informed Consent: I have reviewed the patients History and Physical, chart, labs and discussed the procedure including the risks, benefits and alternatives for the proposed anesthesia with the patient or authorized representative who has indicated his/her understanding and acceptance.   Dental advisory given  Plan Discussed with: CRNA, Anesthesiologist and Surgeon  Anesthesia Plan Comments:         Anesthesia Quick Evaluation

## 2013-04-14 NOTE — H&P (Signed)
NP H&P :  Chief Complaint   Patient presents with   .  Nausea   .  Abdominal Pain    HPI  LENOR PROVENCHER is a 27 y.o. [id number] at Unknown who presents today with abdominal pain. She states she was seen in the office on Monday, and was told the baby did not have a heartbeat. She states that she was given cytotec rx, and self administered cytotec vaginally. She states that she has had very minimal spotting, and did not pass any clots or tissue. She states that she continues to have abdominal pain and nausea.  She states that she last ate at 11:00 today, and she had rice, pork and cabbage. She last drank sprite at that time too.  Past Medical History   Diagnosis  Date   .  Positive PPD    .  Hepatitis B carrier  2003   .  Abortion history     Past Surgical History   Procedure  Laterality  Date   .  No past surgeries      Family History   Problem  Relation  Age of Onset   .  Hypertension  Father    .  Anesthesia problems  Neg Hx     History   Substance Use Topics   .  Smoking status:  Never Smoker   .  Smokeless tobacco:  Never Used   .  Alcohol Use:  No    Allergies: No Known Allergies  Prescriptions prior to admission   Medication  Sig  Dispense  Refill   .  Prenatal Vit-Fe Fumarate-FA (PRENATAL MULTIVITAMIN) TABS  Take 1 tablet by mouth daily.      ROS  Physical Exam   Blood pressure 118/74, pulse 93, temperature 98 F (36.7 C), temperature source Oral, resp. rate 16, height 5' (1.524 m), weight 60.419 kg (133 lb 3.2 oz), last menstrual period 03/09/2013, SpO2 100.00%.  Physical Exam  Nursing note and vitals reviewed.  Constitutional: She is oriented to person, place, and time. She appears well-developed and well-nourished. No distress.  Cardiovascular: Normal rate.  Respiratory: Effort normal.  GI: Soft.  Genitourinary:   External: no lesion Vagina: small amount of white discharge Cervix: pink, smooth, no CMT, ? Tissue at the os, unable to remove the tissue easily.   Uterus: slightly enlarged.    Neurological: She is alert and oriented to person, place, and time.  Skin: Skin is warm and dry.  Psychiatric: She has a normal mood and affect.   MAU Course   Procedures  US Ob Comp Less 14 Wks  04/13/2013 CLINICAL DATA: Missed abortion. Persistent bleeding after satisfactory. Evaluate for retained products of conception. EXAM: OBSTETRIC <14 WK ULTRASOUND TECHNIQUE: Transabdominal ultrasound was performed for evaluation of the gestation as well as the maternal uterus and adnexal regions. COMPARISON: None. FINDINGS: Intrauterine gestational sac: Single irregular sac visualized. Yolk sac: Not visualized Embryo: Visualized Cardiac Activity: Absent CRL: 16 mm 8 w 0 d Maternal uterus/adnexae: Normal ovaries. No adnexal mass or free fluid visualized. IMPRESSION: Persistent failed IUP seen, consistent with missed abortion. Electronically Signed By: Myles Rosenthal On: 04/13/2013 17:24   1642: D/W Dr. Henderson Cloud, will repeat the ultrasound. If still products there plan top give cytotec here in MAU and observe the patient for a short time. Will call her back with results.  1748: Patient's preference would be to have a D&E. She does not feel comfortable trying the medication again because it did not work  the first time.  1800: Dr. Henderson Cloud D/W OR unscheduled cases occuring right now. Will schedule for first thing in the morning. DC home with toradol, and have her be NPO. Will call with surgery time.  Assessment and Plan    1.  Missed abortion   Failed cytotec attempt  For D&E   Pt presents today for D&E- all hx above verified. Agree with NP H&P from above.  All R/B/Alt d/w pt and she desires to proceed.  Verify pt's blood type.

## 2013-04-14 NOTE — Op Note (Signed)
04/14/2013  12:47 PM  PATIENT:  Dana [name]  27 y.o. female  PRE-OPERATIVE DIAGNOSIS:  missed ab  POST-OPERATIVE DIAGNOSIS:  missed ab  PROCEDURE:  Procedure(s): DILATATION AND EVACUATION (N/A)  SURGEON:  Surgeon(s) and Role:    * Jshaun Abernathy A Foch Rosenwald, MD - Primary   ANESTHESIA:   general  EBL:  Total I/O In: 1700 [I.V.:1700] Out: 400 [Urine:100; Blood:300]   LOCAL MEDICATIONS USED:  NONE  SPECIMEN:  Source of Specimen:  uterine currettings  DISPOSITION OF SPECIMEN:  PATHOLOGY  COUNTS:  YES  TOURNIQUET:  * No tourniquets in log *  DICTATION: .Note written in EPIC  PLAN OF CARE: Discharge to home after PACU  PATIENT DISPOSITION:  PACU - hemodynamically stable.   Delay start of Pharmacological VTE agent (>24hrs) due to surgical blood loss or risk of bleeding: not applicable  Medications: Methergine  Complications: None  Findings:  10 week size uterus to 8 size post procedure.  Good crie was achieved.  After adequate anesthesia was achieved, the patient was prepped and draped in the usual sterile fashion.  The speculum was placed in the vagina and the cervix stabilized with a single-tooth tenaculum.  The cervix was dilated with Pratt dilators and the 9 and then 12 mm curette was used to remove contents of the uterus.  Alternating sharp curettage with a curette and suction curettage was performed until all contents were removed and good crie was achieved.  All instruments were removed from the vagina.  The patient tolerated the procedure well.    Denis Carreon A      

## 2013-04-17 ENCOUNTER — Encounter (HOSPITAL_COMMUNITY): Payer: Self-pay | Admitting: Obstetrics and Gynecology

## 2014-04-19 ENCOUNTER — Ambulatory Visit (INDEPENDENT_AMBULATORY_CARE_PROVIDER_SITE_OTHER): Payer: BC Managed Care – PPO | Admitting: Family Medicine

## 2014-04-19 VITALS — BP 120/80 | HR 79 | Temp 98.1°F | Resp 16 | Ht 61.0 in | Wt 133.0 lb

## 2014-04-19 DIAGNOSIS — M436 Torticollis: Secondary | ICD-10-CM

## 2014-04-19 DIAGNOSIS — Z23 Encounter for immunization: Secondary | ICD-10-CM

## 2014-04-19 MED ORDER — MELOXICAM 7.5 MG PO TABS: 7.5000 mg | ORAL_TABLET | Freq: Every day | ORAL | Status: DC

## 2014-04-19 MED ORDER — CYCLOBENZAPRINE HCL 10 MG PO TABS: 10.0000 mg | ORAL_TABLET | Freq: Two times a day (BID) | ORAL | Status: DC | PRN

## 2014-04-19 NOTE — Progress Notes (Signed)
[facility] [address] [place] [address] [phone]  Date:  04/19/2014   Name:  Dana [name]   DOB:  31-Dec-1985   MRN:  [id number]  PCP:  No PCP Per Patient    Chief Complaint: Headache and Nausea   History of Present Illness:  Dana [name] is a 28 y.o. very pleasant female patient who presents with the following:  She is here today with a pain in her neck.  After she showered this am she noted a mild headahce.  While driving to work she felt pain in the side of her head.  This has happened to her in the past.  She had a coining treatment per a co-worker to help with her sx.   However her sx persisted so she came in to be seen She has pain if she turns her head to the left.  Vision and hearing are ok.  She feels a little bit nauseated if the pain is severe.  No vomiting.  She has not noted a ST or cough, otheriwse she feels ok.   She has used flexeril in the past for this same issue which seemed to help her.    She is otherwise in good health.  LMP about 10 days ago.  She does have hep B; she is followed every 6 months but is not yet on any particular program.    Called to get recent labs from 9/15 [facility] liver program  AST: 47 ALT: 48 Alk phos:  69 Bilirubin:  0.4   Patient Active Problem List   Diagnosis Date Noted  . Dysmenorrhea 05/05/2011  . TMJ (temporomandibular joint syndrome) 12/12/2010    Past Medical History  Diagnosis Date  . Positive PPD   . 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 Abortion history     Past Surgical History  Procedure Laterality Date  . No past surgeries    . Dilation and evacuation N/A 04/14/2013    Procedure: DILATATION AND EVACUATION;  Surgeon: Daria Pastures, MD;  Location: [facility];  Service: Gynecology;  Laterality: N/A;    History  Substance Use Topics  . Smoking status: Never Smoker   . Smokeless tobacco: Never Used  . Alcohol Use: No    Family History  Problem Relation Age of Onset  . Hypertension  Father   . Anesthesia problems Neg Hx     No Known Allergies  Medication list has been reviewed and updated.  No current outpatient prescriptions on file prior to visit.   No current facility-administered medications on file prior to visit.    Review of Systems:  As per HPI- otherwise negative.   Physical Examination: Filed Vitals:   04/19/14 1207  BP: 120/80  Pulse: 79  Temp: 98.1 F (36.7 C)  Resp: 16   Filed Vitals:   04/19/14 1207  Height: '5\' 1"'  (1.549 m)  Weight: 133 lb (60.328 kg)   Body mass index is 25.14 kg/(m^2). Ideal Body Weight: Weight in (lb) to have BMI = 25: 132  GEN: WDWN, NAD, Non-toxic, A & O x 3, looks well HEENT: Atraumatic, Normocephalic. Neck supple. No masses, No LAD.  Bilateral TM wnl, oropharynx normal.  PEERL,EOMI.   Ears and Nose: No external deformity. CV: RRR, No M/G/R. No JVD. No thrill. No extra heart sounds. PULM: CTA B, no wheezes, crackles, rhonchi. No retractions. No resp. distress. No accessory muscle use. ABD: S, NT, ND. No rebound. No HSM. EXTR:  No c/c/e NEURO Normal gait.  Complete neuro exam wnl including strength, sensation and DTR all extremities  PSYCH: Normally interactive. Conversant. Not depressed or anxious appearing.  Calm demeanor.  Mild muscular tenderness in her neck.  Evidence of coining on the skin of her neck  Assessment and Plan: Torticollis - Plan: cyclobenzaprine (FLEXERIL) 10 MG tablet, meloxicam (MOBIC) 7.5 MG tablet  Immunization due - Plan: Flu Vaccine QUAD 36+ mos IM  Flu shot today.  Will treat for mild torticollis with meloxicam and flexeril as needed   Signed Lamar Blinks, MD

## 2014-04-19 NOTE — Patient Instructions (Signed)
You got your flu shot today.  Use the mobic as needed for pain, and the flexeril as needed for muscle spasm and pain.  Remember the flexeril can make you feel sleepy!  Let me know if you are not better soon- Sooner if worse.

## 2014-05-10 ENCOUNTER — Ambulatory Visit (INDEPENDENT_AMBULATORY_CARE_PROVIDER_SITE_OTHER): Payer: BC Managed Care – PPO | Admitting: Family Medicine

## 2014-05-10 VITALS — BP 102/70 | HR 66 | Temp 98.1°F | Resp 16 | Ht 60.5 in | Wt 134.6 lb

## 2014-05-10 DIAGNOSIS — B373 Candidiasis of vulva and vagina: Secondary | ICD-10-CM

## 2014-05-10 DIAGNOSIS — R109 Unspecified abdominal pain: Secondary | ICD-10-CM

## 2014-05-10 DIAGNOSIS — R300 Dysuria: Secondary | ICD-10-CM

## 2014-05-10 DIAGNOSIS — N898 Other specified noninflammatory disorders of vagina: Secondary | ICD-10-CM

## 2014-05-10 DIAGNOSIS — B3731 Acute candidiasis of vulva and vagina: Secondary | ICD-10-CM

## 2014-05-10 DIAGNOSIS — S161XXD Strain of muscle, fascia and tendon at neck level, subsequent encounter: Secondary | ICD-10-CM

## 2014-05-10 DIAGNOSIS — L298 Other pruritus: Secondary | ICD-10-CM

## 2014-05-10 LAB — POCT CBC
GRANULOCYTE PERCENT: 58.7 % (ref 37–80)
HCT, POC: 42.7 % (ref 37.7–47.9)
HEMOGLOBIN: 13.4 g/dL (ref 12.2–16.2)
Lymph, poc: 2.5 (ref 0.6–3.4)
MCH, POC: 25.9 pg — AB (ref 27–31.2)
MCHC: 31.5 g/dL — AB (ref 31.8–35.4)
MCV: 82.3 fL (ref 80–97)
MID (cbc): 0.4 (ref 0–0.9)
MPV: 7.7 fL (ref 0–99.8)
POC GRANULOCYTE: 4.1 (ref 2–6.9)
POC LYMPH PERCENT: 35.9 %L (ref 10–50)
POC MID %: 5.4 % (ref 0–12)
Platelet Count, POC: 247 10*3/uL (ref 142–424)
RBC: 5.19 M/uL (ref 4.04–5.48)
RDW, POC: 15.1 %
WBC: 7 10*3/uL (ref 4.6–10.2)

## 2014-05-10 LAB — POCT UA - MICROSCOPIC ONLY
CASTS, UR, LPF, POC: NEGATIVE
Crystals, Ur, HPF, POC: NEGATIVE
Mucus, UA: NEGATIVE
Yeast, UA: NEGATIVE

## 2014-05-10 LAB — POCT URINALYSIS DIPSTICK
BILIRUBIN UA: NEGATIVE
GLUCOSE UA: NEGATIVE
Ketones, UA: NEGATIVE
Leukocytes, UA: NEGATIVE
NITRITE UA: NEGATIVE
PH UA: 7
Protein, UA: NEGATIVE
Spec Grav, UA: 1.025
Urobilinogen, UA: 1

## 2014-05-10 LAB — POCT URINE PREGNANCY: Preg Test, Ur: NEGATIVE

## 2014-05-10 LAB — POCT WET PREP WITH KOH
CLUE CELLS WET PREP PER HPF POC: NEGATIVE
KOH Prep POC: POSITIVE
TRICHOMONAS UA: NEGATIVE
Yeast Wet Prep HPF POC: POSITIVE

## 2014-05-10 MED ORDER — FLUCONAZOLE 150 MG PO TABS: 150.0000 mg | ORAL_TABLET | Freq: Once | ORAL | Status: DC

## 2014-05-10 NOTE — Progress Notes (Signed)
Subjective:    Patient ID: K Dana [name], female    DOB: [birth year], 28 y.o.   MRN: [id number]  05/10/2014  Abdominal Pain   HPI This 28 y.o. female presents for evaluation of RLQ pain. Started today.  No fever/chills/sweats.  +HA for two weeks which is associated with R trapezius pain s/p recent evaluation by Dr. Patsy [name].  +nausea; no vomiting; no diarrhea.  No constipation.  +dysuria since last week intermittent.  +urgency. No hematuria.  Nocturia x 4 times last night; baseline x 2.  +Intermittent vaginal discharge with itching.  Sexually active.  Married.  LMP 04-11-14 three days only; usually 5 days.  Trying to get pregnant.  No radiation of pain into back or into genital region.  Appetite is normal; eating has no effect on pain.    Severity 4/10.  R trapezius strain persistent; compliance with prescribed medication.  Applying heated oils to area.  No radiation into arms; no n/t/w.  Review of Systems  Constitutional: Negative for fever, chills, diaphoresis and fatigue.  Gastrointestinal: Positive for nausea and abdominal pain. Negative for vomiting, diarrhea, constipation, blood in stool, abdominal distention, anal bleeding and rectal pain.  Genitourinary: Positive for dysuria, urgency, frequency and vaginal discharge. Negative for hematuria, flank pain, vaginal bleeding, enuresis, genital sores, vaginal pain, menstrual problem and pelvic pain.  Musculoskeletal: Positive for myalgias, neck pain and neck stiffness.  Neurological: Negative for weakness and numbness.    Past Medical History  Diagnosis Date  . Positive PPD   . 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 Abortion history    Past Surgical History  Procedure Laterality Date  . No past surgeries    . Dilation and evacuation N/A 04/14/2013    Procedure: DILATATION AND EVACUATION;  Surgeon: Dana Laurence, MD;  Location: [facility];  Service: Gynecology;  Laterality: N/A;   No Known Allergies Current Outpatient Prescriptions    Medication Sig Dispense Refill  . cyclobenzaprine (FLEXERIL) 10 MG tablet Take 1 tablet (10 mg total) by mouth 2 (two) times daily as needed for muscle spasms.  30 tablet  0  . meloxicam (MOBIC) 7.5 MG tablet Take 1 tablet (7.5 mg total) by mouth daily.  30 tablet  0  . fluconazole (DIFLUCAN) 150 MG tablet Take 1 tablet (150 mg total) by mouth once. Repeat if needed  2 tablet  0   No current facility-administered medications for this visit.       Objective:    BP 102/70  Pulse 66  Temp(Src) 98.1 F (36.7 C) (Oral)  Resp 16  Ht 5' 0.5" (1.537 m)  Wt 134 lb 9.6 oz (61.054 kg)  BMI 25.84 kg/m2  SpO2 99%  LMP 04/10/2014 Physical Exam  Nursing note and vitals reviewed. Constitutional: She is oriented to person, place, and time. She appears well-developed and well-nourished. No distress.  HENT:  Head: Normocephalic and atraumatic.  Eyes: Conjunctivae are normal. Pupils are equal, round, and reactive to light.  Neck: Normal range of motion. Neck supple. No thyromegaly present.  Cardiovascular: Normal rate, regular rhythm and normal heart sounds.  Exam reveals no gallop and no friction rub.   No murmur heard. Pulmonary/Chest: Effort normal and breath sounds normal. She has no wheezes. She has no rales.  Abdominal: Soft. Bowel sounds are normal. She exhibits no distension and no mass. There is no hepatosplenomegaly. There is tenderness in the epigastric area. There is no rigidity, no rebound, no guarding, no CVA tenderness, no tenderness at McBurney's point and  negative Murphy's sign. No hernia.    Genitourinary: Uterus normal. There is no rash, tenderness, lesion or injury on the right labia. There is no rash, tenderness, lesion or injury on the left labia. Cervix exhibits no motion tenderness, no discharge and no friability. Right adnexum displays no mass, no tenderness and no fullness. Left adnexum displays no mass, no tenderness and no fullness. No erythema, tenderness or bleeding  around the vagina. No foreign body around the vagina. No signs of injury around the vagina. Vaginal discharge found.  Musculoskeletal:       Right shoulder: Normal.       Left shoulder: Normal.       Cervical back: She exhibits tenderness, bony tenderness, pain and spasm. She exhibits normal range of motion.  Cervical spine:  +midline TTP; +TTP paraspinal regions B; full ROM cervical spine with pain.    Lymphadenopathy:    She has no cervical adenopathy.  Neurological: She is alert and oriented to person, place, and time. No cranial nerve deficit. Coordination normal.  Skin: No rash noted. She is not diaphoretic.  Psychiatric: She has a normal mood and affect. Her behavior is normal.   Results for orders placed in visit on 05/10/14  POCT CBC      Result Value Ref Range   WBC 7.0  4.6 - 10.2 K/uL   Lymph, poc 2.5  0.6 - 3.4   POC LYMPH PERCENT 35.9  10 - 50 %L   MID (cbc) 0.4  0 - 0.9   POC MID % 5.4  0 - 12 %M   POC Granulocyte 4.1  2 - 6.9   Granulocyte percent 58.7  37 - 80 %G   RBC 5.19  4.04 - 5.48 M/uL   Hemoglobin 13.4  12.2 - 16.2 g/dL   HCT, POC 16.142.7  09.637.7 - 47.9 %   MCV 82.3  80 - 97 fL   MCH, POC 25.9 (*) 27 - 31.2 pg   MCHC 31.5 (*) 31.8 - 35.4 g/dL   RDW, POC 04.515.1     Platelet Count, POC 247  142 - 424 K/uL   MPV 7.7  0 - 99.8 fL  POCT URINALYSIS DIPSTICK      Result Value Ref Range   Color, UA yellow     Clarity, UA clear     Glucose, UA neg     Bilirubin, UA neg     Ketones, UA neg     Spec Grav, UA 1.025     Blood, UA trace     pH, UA 7.0     Protein, UA neg     Urobilinogen, UA 1.0     Nitrite, UA neg     Leukocytes, UA Negative    POCT UA - MICROSCOPIC ONLY      Result Value Ref Range   WBC, Ur, HPF, POC 0-2     RBC, urine, microscopic 1-3     Bacteria, U Microscopic trace     Mucus, UA neg     Epithelial cells, urine per micros 1-3     Crystals, Ur, HPF, POC neg     Casts, Ur, LPF, POC neg     Yeast, UA neg    POCT URINE PREGNANCY      Result  Value Ref Range   Preg Test, Ur Negative    POCT WET PREP WITH KOH      Result Value Ref Range   Trichomonas, UA Negative     Clue  Cells Wet Prep HPF POC neg     Epithelial Wet Prep HPF POC 3-5     Yeast Wet Prep HPF POC positive     Bacteria Wet Prep HPF POC small     RBC Wet Prep HPF POC 3-5     WBC Wet Prep HPF POC 2-4     KOH Prep POC Positive         Assessment & Plan:   1. Abdominal pain, unspecified abdominal location   2. Dysuria   3. Vaginal itching   4. Vulvovaginal candidiasis   5. Cervical strain, acute, subsequent encounter    1. Abdominal pain R sided:  New.  Benign abdominal exam; normal WBC count.  No colic symptoms to suggest acute nephrolithiasis; minimal blood in urine.  Recommend observation over next 72 hours; obtain CMET. Recommend hydration, BRAT diet. RTC for acute worsening.  Pt expressed understanding. 2.  Intermittent dysuria: New. Send urine culture.  May be secondary to candidiasis vulvovaginal. 3.  Candidiasis vulvovaginal: New. Rx for Diflucan provided. 4.  Torticollis/cervical spine strain: persistent; recommend passive ROM; home exercises provided; continue current medications; continue heat.  Meds ordered this encounter  Medications  . fluconazole (DIFLUCAN) 150 MG tablet    Sig: Take 1 tablet (150 mg total) by mouth once. Repeat if needed    Dispense:  2 tablet    Refill:  0    No Follow-up on file.    Dana SimmerKristi Oria [name], M.D.  [facility]  [place] [address] [place], [place]  [id number] [phone] phone [phone] fax

## 2014-05-10 NOTE — Patient Instructions (Signed)
1. Return for worsening abdominal pain.   2.

## 2014-05-11 LAB — COMPLETE METABOLIC PANEL WITH GFR
ALBUMIN: 4.4 g/dL (ref 3.5–5.2)
ALT: 18 U/L (ref 0–35)
AST: 18 U/L (ref 0–37)
Alkaline Phosphatase: 58 U/L (ref 39–117)
BILIRUBIN TOTAL: 0.3 mg/dL (ref 0.2–1.2)
BUN: 17 mg/dL (ref 6–23)
CHLORIDE: 104 meq/L (ref 96–112)
CO2: 25 meq/L (ref 19–32)
Calcium: 9 mg/dL (ref 8.4–10.5)
Creat: 0.58 mg/dL (ref 0.50–1.10)
GFR, Est Non African American: >89 mL/min
GLUCOSE: 88 mg/dL (ref 70–99)
POTASSIUM: 3.8 meq/L (ref 3.5–5.3)
SODIUM: 137 meq/L (ref 135–145)
TOTAL PROTEIN: 6.9 g/dL (ref 6.0–8.3)

## 2014-05-12 LAB — URINE CULTURE
Colony Count: NO GROWTH
Organism ID, Bacteria: NO GROWTH

## 2014-05-17 ENCOUNTER — Telehealth: Payer: Self-pay | Admitting: Family Medicine

## 2014-05-17 DIAGNOSIS — R1031 Right lower quadrant pain: Secondary | ICD-10-CM

## 2014-05-17 NOTE — Telephone Encounter (Signed)
Patient returned call about her lab results. Clinical staff not available so I delivered results to patient. She is aware that her liver and kidney functions are normal, her sugar is normal, and there is no evidence of a UTI. Asked patient about her abdominal pain and she stated the following: menstrual cycles are very dark and her stomach pain still persists. She feels very weak during her cycle. Please advise.  [phone]

## 2014-05-22 ENCOUNTER — Other Ambulatory Visit: Payer: Self-pay | Admitting: Family Medicine

## 2014-05-22 ENCOUNTER — Other Ambulatory Visit: Payer: Self-pay

## 2014-05-22 DIAGNOSIS — R1031 Right lower quadrant pain: Secondary | ICD-10-CM

## 2014-05-22 NOTE — Telephone Encounter (Signed)
Dr Katrinka BlazingSmith - you said gallbladder US but the patient is complaining of lower abd pain.  Which US did you want to order?

## 2014-05-22 NOTE — Telephone Encounter (Signed)
LM for rtn call. 

## 2014-05-22 NOTE — Telephone Encounter (Signed)
Patient complained of RLQ pain at visit; on exam, her pain was actually in R mid abdomen.  She had no RLQ nor RUQ pain.  She also complained of nausea, but no other symptoms.  Thus, I was going to start with an abdominal us but if she is stating that her pain now is lower pain, we can start with pelvic us.  I have placed order.

## 2014-05-22 NOTE — Telephone Encounter (Signed)
Call --- if abdominal pain still present, recommend obtaining abdominal ultrasound to evaluate gallbladder.  Is she still having abdominal pain?

## 2014-05-22 NOTE — Telephone Encounter (Signed)
Pt is still having sharp pains in her lower front abd. She would like to go for the US abd. Please advise which US to order. Pt is available to go today for the scan.

## 2014-05-24 ENCOUNTER — Ambulatory Visit
Admission: RE | Admit: 2014-05-24 | Discharge: 2014-05-24 | Disposition: A | Discharge status: Home / Self Care | Payer: BC Managed Care – PPO | Source: Ambulatory Visit | Attending: Family Medicine | Admitting: Family Medicine

## 2014-05-24 DIAGNOSIS — R1031 Right lower quadrant pain: Secondary | ICD-10-CM

## 2014-05-29 NOTE — Telephone Encounter (Signed)
Pt went for US- she has been advised of results and is feeling well.

## 2014-06-07 ENCOUNTER — Emergency Department (HOSPITAL_COMMUNITY)
Admission: EM | Admit: 2014-06-07 | Discharge: 2014-06-07 | Disposition: A | Discharge status: Home / Self Care | Payer: BC Managed Care – PPO | Attending: Emergency Medicine | Admitting: Emergency Medicine

## 2014-06-07 ENCOUNTER — Encounter (HOSPITAL_COMMUNITY): Payer: Self-pay | Admitting: Emergency Medicine

## 2014-06-07 DIAGNOSIS — R1084 Generalized abdominal pain: Secondary | ICD-10-CM | POA: Insufficient documentation

## 2014-06-07 DIAGNOSIS — Z791 Long term (current) use of non-steroidal anti-inflammatories (NSAID): Secondary | ICD-10-CM | POA: Diagnosis not present

## 2014-06-07 DIAGNOSIS — Z3202 Encounter for pregnancy test, result negative: Secondary | ICD-10-CM | POA: Diagnosis not present

## 2014-06-07 DIAGNOSIS — Z8619 Personal history of other infectious and parasitic diseases: Secondary | ICD-10-CM | POA: Diagnosis not present

## 2014-06-07 DIAGNOSIS — Z79899 Other long term (current) drug therapy: Secondary | ICD-10-CM | POA: Diagnosis not present

## 2014-06-07 DIAGNOSIS — R509 Fever, unspecified: Secondary | ICD-10-CM | POA: Diagnosis not present

## 2014-06-07 DIAGNOSIS — R110 Nausea: Secondary | ICD-10-CM | POA: Diagnosis not present

## 2014-06-07 DIAGNOSIS — R109 Unspecified abdominal pain: Secondary | ICD-10-CM | POA: Diagnosis present

## 2014-06-07 LAB — COMPREHENSIVE METABOLIC PANEL
ALT: 19 U/L (ref 0–35)
AST: 18 U/L (ref 0–37)
Albumin: 3.8 g/dL (ref 3.5–5.2)
Alkaline Phosphatase: 59 U/L (ref 39–117)
Anion gap: 13 (ref 5–15)
BUN: 19 mg/dL (ref 6–23)
CALCIUM: 9.8 mg/dL (ref 8.4–10.5)
CO2: 25 mEq/L (ref 19–32)
Chloride: 103 mEq/L (ref 96–112)
Creatinine, Ser: 0.75 mg/dL (ref 0.50–1.10)
GFR calc non Af Amer: >90 mL/min (ref >90)
GLUCOSE: 97 mg/dL (ref 70–99)
Potassium: 3.9 mEq/L (ref 3.7–5.3)
Sodium: 141 mEq/L (ref 137–147)
TOTAL PROTEIN: 7.4 g/dL (ref 6.0–8.3)
Total Bilirubin: 0.3 mg/dL (ref 0.3–1.2)

## 2014-06-07 LAB — CBC WITH DIFFERENTIAL/PLATELET
Basophils Absolute: 0 10*3/uL (ref 0.0–0.1)
Basophils Relative: 0 % (ref 0–1)
EOS ABS: 0.1 10*3/uL (ref 0.0–0.7)
EOS PCT: 1 % (ref 0–5)
HCT: 39.2 % (ref 36.0–46.0)
HEMOGLOBIN: 13 g/dL (ref 12.0–15.0)
Lymphocytes Relative: 35 % (ref 12–46)
Lymphs Abs: 2.4 10*3/uL (ref 0.7–4.0)
MCH: 26.5 pg (ref 26.0–34.0)
MCHC: 33.2 g/dL (ref 30.0–36.0)
MCV: 79.8 fL (ref 78.0–100.0)
MONOS PCT: 5 % (ref 3–12)
Monocytes Absolute: 0.3 10*3/uL (ref 0.1–1.0)
Neutro Abs: 4.1 10*3/uL (ref 1.7–7.7)
Neutrophils Relative %: 59 % (ref 43–77)
PLATELETS: 262 10*3/uL (ref 150–400)
RBC: 4.91 MIL/uL (ref 3.87–5.11)
RDW: 13.7 % (ref 11.5–15.5)
WBC: 6.9 10*3/uL (ref 4.0–10.5)

## 2014-06-07 LAB — WET PREP, GENITAL
Clue Cells Wet Prep HPF POC: NONE SEEN
TRICH WET PREP: NONE SEEN
YEAST WET PREP: NONE SEEN

## 2014-06-07 LAB — URINE MICROSCOPIC-ADD ON

## 2014-06-07 LAB — URINALYSIS, ROUTINE W REFLEX MICROSCOPIC
Bilirubin Urine: NEGATIVE
Glucose, UA: NEGATIVE mg/dL
Ketones, ur: NEGATIVE mg/dL
NITRITE: NEGATIVE
Protein, ur: NEGATIVE mg/dL
SPECIFIC GRAVITY, URINE: 1.024 (ref 1.005–1.030)
Urobilinogen, UA: 1 mg/dL (ref 0.0–1.0)
pH: 6 (ref 5.0–8.0)

## 2014-06-07 LAB — POC URINE PREG, ED: Preg Test, Ur: NEGATIVE

## 2014-06-07 MED ORDER — ONDANSETRON 4 MG PO TBDP: 4.0000 mg | ORAL_TABLET | Freq: Three times a day (TID) | ORAL | Status: DC | PRN

## 2014-06-07 NOTE — ED Provider Notes (Signed)
CSN: [id number]     Arrival date & time 06/07/14  0059 History   First MD Initiated Contact with Patient 06/07/14 0143     Chief Complaint  Patient presents with  . Abdominal Pain  . Urinary Tract Infection     (Consider location/radiation/quality/duration/timing/severity/associated sxs/prior Treatment) HPI Comments: Patient reports one week of lower abdominal pain, back pain, dysuria and a feeling of being warm and having chills.  She's had nausea but no vomiting.  Patient states she's not using any form of birth control.  She is trying to get pregnant.  She has a history of having had a miscarriage approximately one year ago.  She has a 30-year-old child at home.  Denies any vaginal discharge did notice a small amount of bleeding (type blood on tissue when she wiped her normal menstrual cycle approximately one week ago  Patient is a 28 y.o. female presenting with abdominal pain and urinary tract infection. The history is provided by the patient.  Abdominal Pain Pain location:  Suprapubic Pain quality: dull   Pain radiates to:  Does not radiate Pain severity:  Mild Onset quality:  Gradual Duration:  1 week Timing:  Constant Progression:  Unchanged Chronicity:  New Relieved by:  Nothing Worsened by:  Nothing tried Ineffective treatments:  None tried Associated symptoms: chills, dysuria, fever and nausea   Associated symptoms: no constipation, no diarrhea, no vaginal bleeding and no vaginal discharge   Urinary Tract Infection Associated symptoms include abdominal pain, chills, a fever and nausea.    Past Medical History  Diagnosis Date  . Positive PPD   . 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 Abortion history    Past Surgical History  Procedure Laterality Date  . No past surgeries    . Dilation and evacuation N/A 04/14/2013    Procedure: DILATATION AND EVACUATION;  Surgeon: Loney LaurenceMichelle A Horvath, MD;  Location: [facility];  Service: Gynecology;  Laterality: N/A;   Family History  Problem  Relation Age of Onset  . Hypertension Father   . Anesthesia problems Neg Hx    History  Substance Use Topics  . Smoking status: Never Smoker   . Smokeless tobacco: Never Used  . Alcohol Use: No   OB History    Gravida Para Term Preterm AB TAB SAB Ectopic Multiple Living   2 1 0 1 1 1 0 0 0 1      Review of Systems  Constitutional: Positive for fever and chills.  Gastrointestinal: Positive for nausea and abdominal pain. Negative for diarrhea and constipation.  Genitourinary: Positive for dysuria. Negative for vaginal bleeding and vaginal discharge.      Allergies  Review of patient's allergies indicates no known allergies.  Home Medications   Prior to Admission medications   Medication Sig Start Date End Date Taking? Authorizing Provider  acetaminophen (TYLENOL) 500 MG tablet Take 1,000 mg by mouth every 6 (six) hours as needed for mild pain.   Yes Historical Provider, MD  Prenatal Vit-Fe Fumarate-FA (PRENATAL MULTIVITAMIN) TABS tablet Take 1 tablet by mouth daily at 12 noon.   Yes Historical Provider, MD  cyclobenzaprine (FLEXERIL) 10 MG tablet Take 1 tablet (10 mg total) by mouth 2 (two) times daily as needed for muscle spasms. Patient not taking: Reported on 06/07/2014 04/19/14   Pearline CablesJessica C Copland, MD  fluconazole (DIFLUCAN) 150 MG tablet Take 1 tablet (150 mg total) by mouth once. Repeat if needed Patient not taking: Reported on 06/07/2014 05/10/14   Ethelda ChickKristi M Smith, MD  meloxicam (  MOBIC) 7.5 MG tablet Take 1 tablet (7.5 mg total) by mouth daily. Patient not taking: Reported on 06/07/2014 04/19/14   Gwenlyn FoundJessica C Copland, MD  ondansetron (ZOFRAN ODT) 4 MG disintegrating tablet Take 1 tablet (4 mg total) by mouth every 8 (eight) hours as needed for nausea or vomiting. 06/07/14   Arman FilterGail K Coltrane Tugwell, NP   BP 114/68 mmHg  Pulse 62  Temp(Src) 97.9 F (36.6 C) (Oral)  Resp 15  SpO2 99%  LMP 05/31/2014 Physical Exam  Constitutional: She appears well-developed and well-nourished.   HENT:  Head: Normocephalic.  Eyes: Pupils are equal, round, and reactive to light.  Neck: Normal range of motion.  Cardiovascular: Normal rate.   Pulmonary/Chest: Effort normal.  Abdominal: Soft. She exhibits no distension. There is no tenderness.  Genitourinary: Vagina normal. Guaiac negative stool. No vaginal discharge found.  Scant amount of dried brown blood in the vaginal vault.  Cervical os is closed.  No other vaginal discharge noted.  No cervical motion tenderness.  No adnexal tenderness  Musculoskeletal: Normal range of motion.  Neurological: She is alert.  Skin: Skin is warm.    ED Course  Procedures (including critical care time) Labs Review Labs Reviewed  WET PREP, GENITAL - Abnormal; Notable for the following:    WBC, Wet Prep HPF POC FEW (*)    All other components within normal limits  URINALYSIS, ROUTINE W REFLEX MICROSCOPIC - Abnormal; Notable for the following:    Hgb urine dipstick LARGE (*)    Leukocytes, UA TRACE (*)    All other components within normal limits  URINE MICROSCOPIC-ADD ON - Abnormal; Notable for the following:    Squamous Epithelial / LPF FEW (*)    All other components within normal limits  GC/CHLAMYDIA PROBE AMP  CBC WITH DIFFERENTIAL  COMPREHENSIVE METABOLIC PANEL  POC URINE PREG, ED  POC URINE PREG, ED    Imaging Review No results found.   EKG Interpretation None     No specific cause for patient's discomfort noted today.  There is exam was benign except for some old dried blood within the vaginal vault without any adnexal or cervical motion tenderness.  No other vaginal discharge noted.  Wet prep negative.  Urine does not show any signs of infection.  White count is normal.  Patient has been discharged home with Zofran to control symptoms.  Follow-up with her primary care physician.  She been given.  Return parameters MDM   Final diagnoses:  Generalized abdominal pain  Nausea         Arman FilterGail K Donetta Isaza, NP 06/07/14  [id number]  Elwin MochaBlair Walden, MD 06/07/14 [phone]

## 2014-06-07 NOTE — Discharge Instructions (Signed)
No specific cause for your abdominal pain was identified tonight, you do not have a urinary tract infection.  Your pelvic exam was benign.  Please monitor this carefully You have been given a prescription for Zofran to help control your nausea.  Return for fever, nausea, vomiting, diarrhea, increased pain

## 2014-06-07 NOTE — ED Notes (Signed)
Pt reports that Monday night she started having lower abdominal and lower back pain with difficulty with urination and pain. She reports fever and chills at home. She also has had nausea but no vomiting diarrhea.

## 2014-06-09 LAB — GC/CHLAMYDIA PROBE AMP
CT Probe RNA: NEGATIVE
GC Probe RNA: NEGATIVE

## 2014-07-13 NOTE — L&D Delivery Note (Signed)
Delivery Note Patient was noted to be 10/10/+3.  She pushed well for 10 minutes.  At 1:31 PM a viable female was delivered via Vaginal, Spontaneous Delivery (Presentation: Right Occiput Posterior).  APGAR: 8 ,9 ; weight pending  .   Placenta status: Intact, Spontaneous.  Cord: 3 vessels with the following complications: None.  Cord pH: n/a  Anesthesia: Epidural  Episiotomy: None Lacerations: 1st degree;Labial Suture Repair: 3.0 vicryl rapide Est. Blood Loss (mL):  150 mL  Mom to postpartum.  Baby to Couplet care / Skin to Skin.  Dana [name] 04/10/2015, 2:03 PM

## 2014-08-09 ENCOUNTER — Ambulatory Visit (INDEPENDENT_AMBULATORY_CARE_PROVIDER_SITE_OTHER): Payer: BLUE CROSS/BLUE SHIELD | Admitting: Physician Assistant

## 2014-08-09 VITALS — BP 108/62 | HR 84 | Temp 98.2°F | Resp 18 | Ht 61.0 in | Wt 137.0 lb

## 2014-08-09 DIAGNOSIS — N926 Irregular menstruation, unspecified: Secondary | ICD-10-CM

## 2014-08-09 DIAGNOSIS — Z3201 Encounter for pregnancy test, result positive: Secondary | ICD-10-CM

## 2014-08-09 DIAGNOSIS — N910 Primary amenorrhea: Secondary | ICD-10-CM

## 2014-08-09 DIAGNOSIS — Z3490 Encounter for supervision of normal pregnancy, unspecified, unspecified trimester: Secondary | ICD-10-CM

## 2014-08-09 LAB — POCT URINE PREGNANCY: Preg Test, Ur: POSITIVE

## 2014-08-09 NOTE — Progress Notes (Signed)
   Subjective:    Patient ID: Dana [name], female    DOB: 07/09/1986, 29 y.o.   MRN: [id number]  HPI Patient presents to confirm pregnancy following positive pregnancy test at home. LMP 12/21/5 and was regular. G3P1. Her and her husband have been trying to get pregnant and she is already taking prenatal vitamins. Last Pap normal. Last pregnancy was vaginal to a premature baby body without any other complications. Diet is relatively healthy.    Review of Systems  Constitutional: Negative for fever, chills, appetite change and fatigue.  Respiratory: Negative for shortness of breath.   Cardiovascular: Negative for chest pain, palpitations and leg swelling.  Gastrointestinal: Negative for nausea, vomiting, abdominal pain, diarrhea and constipation.  Genitourinary: Negative.   Neurological: Negative for headaches.       Objective:   Physical Exam  Constitutional: She is oriented to person, place, and time. She appears well-developed and well-nourished. No distress.  Blood pressure 108/62, pulse 84, temperature 98.2 F (36.8 C), temperature source Oral, resp. rate 18, height 5\' 1"  (1.549 m), weight 137 lb (62.143 kg), last menstrual period 07/02/2014, SpO2 100 %.  HENT:  Head: Normocephalic and atraumatic.  Right Ear: External ear normal.  Left Ear: External ear normal.  Eyes: Conjunctivae are normal. Right eye exhibits no discharge. Left eye exhibits no discharge. No scleral icterus.  Cardiovascular: Normal rate, regular rhythm and normal heart sounds.  Exam reveals no gallop and no friction rub.   No murmur heard. Pulmonary/Chest: Effort normal and breath sounds normal. No respiratory distress. She has no wheezes. She has no rales.  Abdominal: Soft. Bowel sounds are normal. She exhibits no distension and no mass. There is no tenderness. There is no rebound and no guarding.  Neurological: She is alert and oriented to person, place, and time.  Skin: Skin is warm and dry. No rash noted.  She is not diaphoretic. No erythema. No pallor.   Results for orders placed or performed in visit on 08/09/14  POCT urine pregnancy  Result Value Ref Range   Preg Test, Ur Positive       Assessment & Plan:  1. Pregnant 2. Late period - POCT urine pregnancy - Continue prenatal vitamin daily. - Drink 64 oz of water. 32 oz in the morning and 32 oz at night.  - Can read "Your pregnancy week by week" - Expected due date 04/08/15.   Janan Ridgeishira Benji Poynter PA-C  [facility] 08/09/2014 4:42 PM

## 2014-08-09 NOTE — Progress Notes (Signed)
  Medical screening examination/treatment/procedure(s) were performed by non-physician practitioner and as supervising physician I was immediately available for consultation/collaboration.     

## 2014-08-09 NOTE — Patient Instructions (Addendum)
1. Continue prenatal vitamin daily. 2. Drink 64 oz of water. 32 oz in the morning and 32 oz at night.  3. Can read "Your pregnancy week by week" 4. Expected due date 04/08/15.

## 2014-08-20 ENCOUNTER — Inpatient Hospital Stay (HOSPITAL_COMMUNITY): Payer: Medicaid Other

## 2014-08-20 ENCOUNTER — Encounter (HOSPITAL_COMMUNITY): Payer: Self-pay | Admitting: Student

## 2014-08-20 ENCOUNTER — Inpatient Hospital Stay (HOSPITAL_COMMUNITY)
Admission: AD | Admit: 2014-08-20 | Discharge: 2014-08-20 | Disposition: A | Discharge status: Home / Self Care | Payer: Medicaid Other | Source: Ambulatory Visit | Attending: Obstetrics & Gynecology | Admitting: Obstetrics & Gynecology

## 2014-08-20 DIAGNOSIS — N949 Unspecified condition associated with female genital organs and menstrual cycle: Secondary | ICD-10-CM | POA: Diagnosis not present

## 2014-08-20 DIAGNOSIS — R109 Unspecified abdominal pain: Secondary | ICD-10-CM | POA: Diagnosis present

## 2014-08-20 DIAGNOSIS — R102 Pelvic and perineal pain: Secondary | ICD-10-CM

## 2014-08-20 DIAGNOSIS — Z3A01 Less than 8 weeks gestation of pregnancy: Secondary | ICD-10-CM | POA: Diagnosis not present

## 2014-08-20 DIAGNOSIS — O9989 Other specified diseases and conditions complicating pregnancy, childbirth and the puerperium: Secondary | ICD-10-CM | POA: Insufficient documentation

## 2014-08-20 DIAGNOSIS — O26891 Other specified pregnancy related conditions, first trimester: Secondary | ICD-10-CM

## 2014-08-20 LAB — URINALYSIS, ROUTINE W REFLEX MICROSCOPIC
Bilirubin Urine: NEGATIVE
GLUCOSE, UA: NEGATIVE mg/dL
KETONES UR: 15 mg/dL — AB
LEUKOCYTES UA: NEGATIVE
Nitrite: NEGATIVE
PH: 5.5 (ref 5.0–8.0)
Protein, ur: NEGATIVE mg/dL
Specific Gravity, Urine: <1.005 — ABNORMAL LOW (ref 1.005–1.030)
Urobilinogen, UA: 0.2 mg/dL (ref 0.0–1.0)

## 2014-08-20 LAB — CBC
HEMATOCRIT: 41.2 % (ref 36.0–46.0)
HEMOGLOBIN: 13.9 g/dL (ref 12.0–15.0)
MCH: 26.7 pg (ref 26.0–34.0)
MCHC: 33.7 g/dL (ref 30.0–36.0)
MCV: 79.2 fL (ref 78.0–100.0)
Platelets: 259 10*3/uL (ref 150–400)
RBC: 5.2 MIL/uL — ABNORMAL HIGH (ref 3.87–5.11)
RDW: 14.5 % (ref 11.5–15.5)
WBC: 10 10*3/uL (ref 4.0–10.5)

## 2014-08-20 LAB — WET PREP, GENITAL
Clue Cells Wet Prep HPF POC: NONE SEEN
Trich, Wet Prep: NONE SEEN
Yeast Wet Prep HPF POC: NONE SEEN

## 2014-08-20 LAB — URINE MICROSCOPIC-ADD ON

## 2014-08-20 LAB — HCG, QUANTITATIVE, PREGNANCY: HCG, BETA CHAIN, QUANT, S: 26345 m[IU]/mL — AB (ref <5)

## 2014-08-20 MED ORDER — PROMETHAZINE HCL 25 MG PO TABS: 12.5000 mg | ORAL_TABLET | Freq: Four times a day (QID) | ORAL | Status: DC | PRN

## 2014-08-20 MED ORDER — METOCLOPRAMIDE HCL 10 MG PO TABS
10.0000 mg | ORAL_TABLET | Freq: Once | ORAL | Status: AC
  Administered 2014-08-20: 10 mg via ORAL
  Filled 2014-08-20: qty 1

## 2014-08-20 NOTE — Discharge Instructions (Signed)
First Trimester of Pregnancy The first trimester of pregnancy is from week 1 until the end of week 12 (months 1 through 3). A week after a sperm fertilizes an egg, the egg will implant on the wall of the uterus. This embryo will begin to develop into a baby. Genes from you and your partner are forming the baby. The female genes determine whether the baby is a boy or a girl. At 6-8 weeks, the eyes and face are formed, and the heartbeat can be seen on ultrasound. At the end of 12 weeks, all the baby's organs are formed.  Now that you are pregnant, you will want to do everything you can to have a healthy baby. Two of the most important things are to get good prenatal care and to follow your health care provider's instructions. Prenatal care is all the medical care you receive before the baby's birth. This care will help prevent, find, and treat any problems during the pregnancy and childbirth. BODY CHANGES Your body goes through many changes during pregnancy. The changes vary from woman to woman.   You may gain or lose a couple of pounds at first.  You may feel sick to your stomach (nauseous) and throw up (vomit). If the vomiting is uncontrollable, call your health care provider.  You may tire easily.  You may develop headaches that can be relieved by medicines approved by your health care provider.  You may urinate more often. Painful urination may mean you have a bladder infection.  You may develop heartburn as a result of your pregnancy.  You may develop constipation because certain hormones are causing the muscles that push waste through your intestines to slow down.  You may develop hemorrhoids or swollen, bulging veins (varicose veins).  Your breasts may begin to grow larger and become tender. Your nipples may stick out more, and the tissue that surrounds them (areola) may become darker.  Your gums may bleed and may be sensitive to brushing and flossing.  Dark spots or blotches (chloasma,  mask of pregnancy) may develop on your face. This will likely fade after the baby is born.  Your menstrual periods will stop.  You may have a loss of appetite.  You may develop cravings for certain kinds of food.  You may have changes in your emotions from day to day, such as being excited to be pregnant or being concerned that something may go wrong with the pregnancy and baby.  You may have more vivid and strange dreams.  You may have changes in your hair. These can include thickening of your hair, rapid growth, and changes in texture. Some women also have hair loss during or after pregnancy, or hair that feels dry or thin. Your hair will most likely return to normal after your baby is born. WHAT TO EXPECT AT YOUR PRENATAL VISITS During a routine prenatal visit:  You will be weighed to make sure you and the baby are growing normally.  Your blood pressure will be taken.  Your abdomen will be measured to track your baby's growth.  The fetal heartbeat will be listened to starting around week 10 or 12 of your pregnancy.  Test results from any previous visits will be discussed. Your health care provider may ask you:  How you are feeling.  If you are feeling the baby move.  If you have had any abnormal symptoms, such as leaking fluid, bleeding, severe headaches, or abdominal cramping.  If you have any questions. Other tests   that may be performed during your first trimester include:  Blood tests to find your blood type and to check for the presence of any previous infections. They will also be used to check for low iron levels (anemia) and Rh antibodies. Later in the pregnancy, blood tests for diabetes will be done along with other tests if problems develop.  Urine tests to check for infections, diabetes, or protein in the urine.  An ultrasound to confirm the proper growth and development of the baby.  An amniocentesis to check for possible genetic problems.  Fetal screens for  spina bifida and Down syndrome.  You may need other tests to make sure you and the baby are doing well. HOME CARE INSTRUCTIONS  Medicines  Follow your health care provider's instructions regarding medicine use. Specific medicines may be either safe or unsafe to take during pregnancy.  Take your prenatal vitamins as directed.  If you develop constipation, try taking a stool softener if your health care provider approves. Diet  Eat regular, well-balanced meals. Choose a variety of foods, such as meat or vegetable-based protein, fish, milk and low-fat dairy products, vegetables, fruits, and whole grain breads and cereals. Your health care provider will help you determine the amount of weight gain that is right for you.  Avoid raw meat and uncooked cheese. These carry germs that can cause birth defects in the baby.  Eating four or five small meals rather than three large meals a day may help relieve nausea and vomiting. If you start to feel nauseous, eating a few soda crackers can be helpful. Drinking liquids between meals instead of during meals also seems to help nausea and vomiting.  If you develop constipation, eat more high-fiber foods, such as fresh vegetables or fruit and whole grains. Drink enough fluids to keep your urine clear or pale yellow. Activity and Exercise  Exercise only as directed by your health care provider. Exercising will help you:  Control your weight.  Stay in shape.  Be prepared for labor and delivery.  Experiencing pain or cramping in the lower abdomen or low back is a good sign that you should stop exercising. Check with your health care provider before continuing normal exercises.  Try to avoid standing for long periods of time. Move your legs often if you must stand in one place for a long time.  Avoid heavy lifting.  Wear low-heeled shoes, and practice good posture.  You may continue to have sex unless your health care provider directs you  otherwise. Relief of Pain or Discomfort  Wear a good support bra for breast tenderness.   Take warm sitz baths to soothe any pain or discomfort caused by hemorrhoids. Use hemorrhoid cream if your health care provider approves.   Rest with your legs elevated if you have leg cramps or low back pain.  If you develop varicose veins in your legs, wear support hose. Elevate your feet for 15 minutes, 3-4 times a day. Limit salt in your diet. Prenatal Care  Schedule your prenatal visits by the twelfth week of pregnancy. They are usually scheduled monthly at first, then more often in the last 2 months before delivery.  Write down your questions. Take them to your prenatal visits.  Keep all your prenatal visits as directed by your health care provider. Safety  Wear your seat belt at all times when driving.  Make a list of emergency phone numbers, including numbers for family, friends, the hospital, and police and fire departments. General Tips    Ask your health care provider for a referral to a local prenatal education class. Begin classes no later than at the beginning of month 6 of your pregnancy.  Ask for help if you have counseling or nutritional needs during pregnancy. Your health care provider can offer advice or refer you to specialists for help with various needs.  Do not use hot tubs, steam rooms, or saunas.  Do not douche or use tampons or scented sanitary pads.  Do not cross your legs for long periods of time.  Avoid cat litter boxes and soil used by cats. These carry germs that can cause birth defects in the baby and possibly loss of the fetus by miscarriage or stillbirth.  Avoid all smoking, herbs, alcohol, and medicines not prescribed by your health care provider. Chemicals in these affect the formation and growth of the baby.  Schedule a dentist appointment. At home, brush your teeth with a soft toothbrush and be gentle when you floss. SEEK MEDICAL CARE IF:   You have  dizziness.  You have mild pelvic cramps, pelvic pressure, or nagging pain in the abdominal area.  You have persistent nausea, vomiting, or diarrhea.  You have a bad smelling vaginal discharge.  You have pain with urination.  You notice increased swelling in your face, hands, legs, or ankles. SEEK IMMEDIATE MEDICAL CARE IF:   You have a fever.  You are leaking fluid from your vagina.  You have spotting or bleeding from your vagina.  You have severe abdominal cramping or pain.  You have rapid weight gain or loss.  You vomit blood or material that looks like coffee grounds.  You are exposed to German measles and have never had them.  You are exposed to fifth disease or chickenpox.  You develop a severe headache.  You have shortness of breath.  You have any kind of trauma, such as from a fall or a car accident. Document Released: 06/23/2001 Document Revised: 11/13/2013 Document Reviewed: 05/09/2013 Exit[employer] Patient Information 2015 [employer]. This information is not intended to replace advice given to you by your health care provider. Make sure you discuss any questions you have with your health care provider.  

## 2014-08-20 NOTE — MAU Note (Signed)
Pt reports she started having left lower abd pain since last pm, today pain is more lower mid abd and is worsening. States it hurts to urinate and she is nauseated from the pain.

## 2014-08-20 NOTE — MAU Provider Note (Signed)
History     CSN: [id number]  Arrival date and time: 08/20/14 2048   First Provider Initiated Contact with Patient 08/20/14 2133       Chief Complaint  Patient presents with  . Abdominal Pain   HPI  [facility] 29 y.o. [id number] at [id number] by LMP who presents with abdominal pain. Left side tightening and sharp lower abdominal pain that comes and goes. Started yesterday. Rates pain as 7/10. Has not taking medication. Lying down alleviates the pain. Also reports increased urinary frequency and difficulty emptying bladder x 2 days. Denies dysuria. Nausea that she associates with pain, but no vomiting/diarrhea/constipation. Denies vaginal bleeding or discharge.    OB History    Gravida Para Term Preterm AB TAB SAB Ectopic Multiple Living   4 1 0 1 2 1 1 0 0 1       Past Medical History  Diagnosis Date  . Positive PPD   . 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 Abortion history     Past Surgical History  Procedure Laterality Date  . No past surgeries    . Dilation and evacuation N/A 04/14/2013    Procedure: DILATATION AND EVACUATION;  Surgeon: Loney LaurenceMichelle A Horvath, MD;  Location: [facility];  Service: Gynecology;  Laterality: N/A;    Family History  Problem Relation Age of Onset  . Hypertension Father   . Anesthesia problems Neg Hx     History  Substance Use Topics  . Smoking status: Never Smoker   . Smokeless tobacco: Never Used  . Alcohol Use: No    Allergies: No Known Allergies  Prescriptions prior to admission  Medication Sig Dispense Refill Last Dose  . acetaminophen (TYLENOL) 500 MG tablet Take 1,000 mg by mouth every 6 (six) hours as needed for mild pain.   Past Month at Unknown time  . Prenatal Vit-Fe Fumarate-FA (PRENATAL MULTIVITAMIN) TABS tablet Take 1 tablet by mouth daily at 12 noon.   Past Week at Unknown time  . cyclobenzaprine (FLEXERIL) 10 MG tablet Take 1 tablet (10 mg total) by mouth 2 (two) times daily as needed for muscle spasms. (Patient not taking: Reported on  06/07/2014) 30 tablet 0 More than a month at Unknown time  . fluconazole (DIFLUCAN) 150 MG tablet Take 1 tablet (150 mg total) by mouth once. Repeat if needed (Patient not taking: Reported on 06/07/2014) 2 tablet 0 More than a month at Unknown time  . meloxicam (MOBIC) 7.5 MG tablet Take 1 tablet (7.5 mg total) by mouth daily. (Patient not taking: Reported on 06/07/2014) 30 tablet 0 More than a month at Unknown time  . ondansetron (ZOFRAN ODT) 4 MG disintegrating tablet Take 1 tablet (4 mg total) by mouth every 8 (eight) hours as needed for nausea or vomiting. (Patient not taking: Reported on 08/09/2014) 20 tablet 0 More than a month at Unknown time    Review of Systems  Constitutional: Negative.   Respiratory: Negative.   Cardiovascular: Negative.   Gastrointestinal: Positive for nausea and abdominal pain. Negative for vomiting, diarrhea and constipation.  Genitourinary: Positive for frequency. Negative for dysuria, hematuria and flank pain.       Negative for vaginal discharge or vaginal bleeding.    Physical Exam   Blood pressure 111/76, pulse 80, temperature 98.7 F (37.1 C), temperature source Oral, resp. rate 16, height 5' (1.524 m), weight 136 lb (61.689 kg), last menstrual period 07/02/2014, SpO2 100 %.  Physical Exam  Constitutional: She is oriented to person, place,  and time. She appears well-developed and well-nourished. No distress.  Cardiovascular: Normal rate, regular rhythm and normal heart sounds.   No murmur heard. Respiratory: Effort normal and breath sounds normal. No respiratory distress. She has no wheezes.  GI: Soft. Bowel sounds are normal. She exhibits distension. She exhibits no mass. There is tenderness (mild tenderness with deep palpation in lower abdomen). There is no rebound, no guarding and no CVA tenderness.  Genitourinary: Vagina normal. Uterus is enlarged. Uterus is not deviated, not fixed and not tender. Cervix exhibits discharge (minimal amount of white  discharge). Cervix exhibits no motion tenderness and no friability. Right adnexum displays no mass, no tenderness and no fullness. Left adnexum displays no mass, no tenderness and no fullness.  Neurological: She is alert and oriented to person, place, and time.  Skin: Skin is warm and dry. No rash noted. She is not diaphoretic. No erythema. No pallor.  Psychiatric: She has a normal mood and affect. Her behavior is normal. Judgment and thought content normal.   Results for orders placed or performed during the hospital encounter of 08/20/14 (from the past 24 hour(s))  CBC     Status: Abnormal   Collection Time: 08/20/14  8:52 PM  Result Value Ref Range   WBC 10.0 4.0 - 10.5 K/uL   RBC 5.20 (H) 3.87 - 5.11 MIL/uL   Hemoglobin 13.9 12.0 - 15.0 g/dL   HCT 16.1 09.6 - 04.5 %   MCV 79.2 78.0 - 100.0 fL   MCH 26.7 26.0 - 34.0 pg   MCHC 33.7 30.0 - 36.0 g/dL   RDW 40.9 81.1 - 91.4 %   Platelets 259 150 - 400 K/uL  hCG, quantitative, pregnancy     Status: Abnormal   Collection Time: 08/20/14  8:52 PM  Result Value Ref Range   hCG, Beta Chain, Quant, S 78295 (H) <5 mIU/mL  Urinalysis, Routine w reflex microscopic     Status: Abnormal   Collection Time: 08/20/14  9:10 PM  Result Value Ref Range   Color, Urine YELLOW YELLOW   APPearance CLEAR CLEAR   Specific Gravity, Urine <1.005 (L) 1.005 - 1.030   pH 5.5 5.0 - 8.0   Glucose, UA NEGATIVE NEGATIVE mg/dL   Hgb urine dipstick TRACE (A) NEGATIVE   Bilirubin Urine NEGATIVE NEGATIVE   Ketones, ur 15 (A) NEGATIVE mg/dL   Protein, ur NEGATIVE NEGATIVE mg/dL   Urobilinogen, UA 0.2 0.0 - 1.0 mg/dL   Nitrite NEGATIVE NEGATIVE   Leukocytes, UA NEGATIVE NEGATIVE  Urine microscopic-add on     Status: Abnormal   Collection Time: 08/20/14  9:10 PM  Result Value Ref Range   Squamous Epithelial / LPF FEW (A) RARE   WBC, UA 0-2 <3 WBC/hpf   Bacteria, UA RARE RARE  Wet prep, genital     Status: Abnormal   Collection Time: 08/20/14  9:50 PM  Result  Value Ref Range   Yeast Wet Prep HPF POC NONE SEEN NONE SEEN   Trich, Wet Prep NONE SEEN NONE SEEN   Clue Cells Wet Prep HPF POC NONE SEEN NONE SEEN   WBC, Wet Prep HPF POC FEW (A) NONE SEEN   US Ob Comp Less 14 Wks  08/20/2014   CLINICAL DATA:  Pelvic pain.  First-trimester pregnancy.  EXAM: OBSTETRIC <14 WK [place] AND TRANSVAGINAL OB US  TECHNIQUE: Both transabdominal and transvaginal ultrasound examinations were performed for complete evaluation of the gestation as well as the maternal uterus, adnexal regions, and pelvic cul-de-sac. Transvaginal  technique was performed to assess early pregnancy.  COMPARISON:  05/24/2014.  FINDINGS: Intrauterine gestational sac: Visualized/normal in shape.  Yolk sac:  Present  Embryo:  Present  Cardiac Activity: Present  Heart Rate: 115  bpm  CRL:  3.4 mm  mm   6 w   0 d                  [place] EDC: 04/08/2015  Maternal uterus/adnexae: Small subchorionic hemorrhage is present. Both ovaries appear within normal limits. Trace free fluid in the anatomic pelvis.  IMPRESSION: Single intrauterine pregnancy with small subchorionic hemorrhage.   Electronically Signed   By: Andreas Newport M.D.   On: 08/20/2014 22:41   US Ob Transvaginal  08/20/2014   CLINICAL DATA:  Pelvic pain.  First-trimester pregnancy.  EXAM: OBSTETRIC <14 WK [place] AND TRANSVAGINAL OB US  TECHNIQUE: Both transabdominal and transvaginal ultrasound examinations were performed for complete evaluation of the gestation as well as the maternal uterus, adnexal regions, and pelvic cul-de-sac. Transvaginal technique was performed to assess early pregnancy.  COMPARISON:  05/24/2014.  FINDINGS: Intrauterine gestational sac: Visualized/normal in shape.  Yolk sac:  Present  Embryo:  Present  Cardiac Activity: Present  Heart Rate: 115  bpm  CRL:  3.4 mm  mm   6 w   0 d                  [place] EDC: 04/08/2015  Maternal uterus/adnexae: Small subchorionic hemorrhage is present. Both ovaries appear within normal limits. Trace free fluid in  the anatomic pelvis.  IMPRESSION: Single intrauterine pregnancy with small subchorionic hemorrhage.   Electronically Signed   By: Andreas Newport M.D.   On: 08/20/2014 22:41    MAU Course  Procedures  MDM Reglan PO given for nausea Pt declines pain medication at this time Pt still nauseated, but no vomiting while in MAU IUP at 6 wks Assessment and Plan  Assessment: 1. Pelvic pain affecting pregnancy in first trimester, antepartum     Plan: Discharge home GC/CT pending Given list of ob/gyn providers to schedule initial prenatal visit Rx phenergan PO Discussed reasons to return to MAU  Follow-up Information    Please follow up.   Contact information:   Please see the list of providers and make an appointment with the provider of your choice.        I was present with the resident/student and agree with the above.     Claudie Revering, Student-NP  08/20/2014, 9:43 PM

## 2014-08-21 LAB — GC/CHLAMYDIA PROBE AMP (~~LOC~~) NOT AT ARMC
Chlamydia: NEGATIVE
Neisseria Gonorrhea: NEGATIVE

## 2014-08-22 LAB — HIV ANTIBODY (ROUTINE TESTING W REFLEX): HIV Screen 4th Generation wRfx: NONREACTIVE

## 2014-10-03 LAB — OB RESULTS CONSOLE ABO/RH: RH Type: POSITIVE

## 2014-10-03 LAB — OB RESULTS CONSOLE RPR: RPR: NONREACTIVE

## 2014-10-03 LAB — OB RESULTS CONSOLE HIV ANTIBODY (ROUTINE TESTING): HIV: NONREACTIVE

## 2014-10-03 LAB — OB RESULTS CONSOLE GC/CHLAMYDIA
CHLAMYDIA, DNA PROBE: NEGATIVE
GC PROBE AMP, GENITAL: NEGATIVE

## 2014-10-03 LAB — OB RESULTS CONSOLE ANTIBODY SCREEN: Antibody Screen: NEGATIVE

## 2014-10-03 LAB — OB RESULTS CONSOLE RUBELLA ANTIBODY, IGM: Rubella: IMMUNE

## 2014-11-13 ENCOUNTER — Inpatient Hospital Stay (HOSPITAL_COMMUNITY)
Admission: AD | Admit: 2014-11-13 | Discharge: 2014-11-14 | Disposition: A | Discharge status: Home / Self Care | Payer: Medicaid Other | Source: Ambulatory Visit | Attending: Obstetrics and Gynecology | Admitting: Obstetrics and Gynecology

## 2014-11-13 ENCOUNTER — Encounter (HOSPITAL_COMMUNITY): Payer: Self-pay | Admitting: *Deleted

## 2014-11-13 DIAGNOSIS — N949 Unspecified condition associated with female genital organs and menstrual cycle: Secondary | ICD-10-CM | POA: Diagnosis not present

## 2014-11-13 DIAGNOSIS — O9989 Other specified diseases and conditions complicating pregnancy, childbirth and the puerperium: Secondary | ICD-10-CM | POA: Insufficient documentation

## 2014-11-13 DIAGNOSIS — M549 Dorsalgia, unspecified: Secondary | ICD-10-CM | POA: Diagnosis present

## 2014-11-13 DIAGNOSIS — Z3A18 18 weeks gestation of pregnancy: Secondary | ICD-10-CM | POA: Insufficient documentation

## 2014-11-13 DIAGNOSIS — R51 Headache: Secondary | ICD-10-CM | POA: Insufficient documentation

## 2014-11-13 DIAGNOSIS — R102 Pelvic and perineal pain: Secondary | ICD-10-CM | POA: Insufficient documentation

## 2014-11-13 DIAGNOSIS — R519 Headache, unspecified: Secondary | ICD-10-CM

## 2014-11-13 DIAGNOSIS — M436 Torticollis: Secondary | ICD-10-CM

## 2014-11-13 LAB — URINALYSIS, ROUTINE W REFLEX MICROSCOPIC
Bilirubin Urine: NEGATIVE
Glucose, UA: NEGATIVE mg/dL
Hgb urine dipstick: NEGATIVE
Ketones, ur: NEGATIVE mg/dL
Leukocytes, UA: NEGATIVE
Nitrite: NEGATIVE
Protein, ur: NEGATIVE mg/dL
SPECIFIC GRAVITY, URINE: 1.02 (ref 1.005–1.030)
UROBILINOGEN UA: 0.2 mg/dL (ref 0.0–1.0)
pH: 5.5 (ref 5.0–8.0)

## 2014-11-13 MED ORDER — CYCLOBENZAPRINE HCL 10 MG PO TABS: 10.0000 mg | ORAL_TABLET | Freq: Two times a day (BID) | ORAL | Status: DC | PRN

## 2014-11-13 MED ORDER — CYCLOBENZAPRINE HCL 10 MG PO TABS
10.0000 mg | ORAL_TABLET | Freq: Once | ORAL | Status: AC
  Administered 2014-11-13: 10 mg via ORAL
  Filled 2014-11-13: qty 1

## 2014-11-13 MED ORDER — ACETAMINOPHEN 500 MG PO TABS
1000.0000 mg | ORAL_TABLET | Freq: Once | ORAL | Status: AC
  Administered 2014-11-13: 1000 mg via ORAL
  Filled 2014-11-13: qty 2

## 2014-11-13 NOTE — Discharge Instructions (Signed)
Abdominal Pain During Pregnancy °Belly (abdominal) pain is common during pregnancy. Most of the time, it is not a serious problem. Other times, it can be a sign that something is wrong with the pregnancy. Always tell your doctor if you have belly pain. °HOME CARE °Monitor your belly pain for any changes. The following actions may help you feel better: °· Do not have sex (intercourse) or put anything in your vagina until you feel better. °· Rest until your pain stops. °· Drink clear fluids if you feel sick to your stomach (nauseous). Do not eat solid food until you feel better. °· Only take medicine as told by your doctor. °· Keep all doctor visits as told. °GET HELP RIGHT AWAY IF:  °· You are bleeding, leaking fluid, or pieces of tissue come out of your vagina. °· You have more pain or cramping. °· You keep throwing up (vomiting). °· You have pain when you pee (urinate) or have blood in your pee. °· You have a fever. °· You do not feel your baby moving as much. °· You feel very weak or feel like passing out. °· You have trouble breathing, with or without belly pain. °· You have a very bad headache and belly pain. °· You have fluid leaking from your vagina and belly pain. °· You keep having watery poop (diarrhea). °· Your belly pain does not go away after resting, or the pain gets worse. °MAKE SURE YOU:  °· Understand these instructions. °· Will watch your condition. °· Will get help right away if you are not doing well or get worse. °Document Released: 06/17/2009 Document Revised: 03/01/2013 Document Reviewed: 01/26/2013 °[employer]® Patient Information ©2015 [employer]. This information is not intended to replace advice given to you by your health care provider. Make sure you discuss any questions you have with your health care provider. ° °

## 2014-11-13 NOTE — MAU Provider Note (Signed)
History     CSN: [id number]  Arrival date and time: 11/13/14 2112   First Provider Initiated Contact with Patient 11/13/14 2205      Chief Complaint  Patient presents with  . Back Pain  . Pelvic Pain  . Abdominal Pain   HPI  Ms. Dana [name] is a 29 y.o. [phone] at 1525w1d who presents to MAU today with complaint of lower abdominal and back pain and headache. The patient states pain started today. She rates pain at 6/10 now. She has not taken anything for pain. She denies vaginal bleeding, discharge, LOF, N/V/D or constipation. She states pain increased with ambulation and full bladder. She states pain is improved with rest. She states pain has been intermittent lasting 30 minutes to an hour at a time. She states previous pregnancy she had preterm delivery "2 month" early. She denies other complication with this pregnancy.   OB History    Gravida Para Term Preterm AB TAB SAB Ectopic Multiple Living   4 1 0 1 2 1 1 0 0 1       Past Medical History  Diagnosis Date  . Positive PPD   . 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 Abortion history     Past Surgical History  Procedure Laterality Date  . No past surgeries    . Dilation and evacuation N/A 04/14/2013    Procedure: DILATATION AND EVACUATION;  Surgeon: Loney LaurenceMichelle A Avion Kutzer, MD;  Location: [facility];  Service: Gynecology;  Laterality: N/A;    Family History  Problem Relation Age of Onset  . Hypertension Father   . Anesthesia problems Neg Hx     History  Substance Use Topics  . Smoking status: Never Smoker   . Smokeless tobacco: Never Used  . Alcohol Use: No    Allergies: No Known Allergies  Prescriptions prior to admission  Medication Sig Dispense Refill Last Dose  . acetaminophen (TYLENOL) 500 MG tablet Take 500-1,000 mg by mouth every 6 (six) hours as needed for mild pain.    Past Month at Unknown time  . Prenatal Vit-Fe Fumarate-FA (PRENATAL MULTIVITAMIN) TABS tablet Take 1 tablet by mouth daily at 12 noon.   11/12/2014 at  Unknown time  . cyclobenzaprine (FLEXERIL) 10 MG tablet Take 1 tablet (10 mg total) by mouth 2 (two) times daily as needed for muscle spasms. (Patient not taking: Reported on 06/07/2014) 30 tablet 0 Not Taking at Unknown time  . ondansetron (ZOFRAN ODT) 4 MG disintegrating tablet Take 1 tablet (4 mg total) by mouth every 8 (eight) hours as needed for nausea or vomiting. (Patient not taking: Reported on 08/09/2014) 20 tablet 0 Not Taking at Unknown time  . promethazine (PHENERGAN) 25 MG tablet Take 0.5-1 tablets (12.5-25 mg total) by mouth every 6 (six) hours as needed. (Patient not taking: Reported on 11/13/2014) 30 tablet 0 Not Taking at Unknown time    Review of Systems  Constitutional: Negative for fever and malaise/fatigue.  Gastrointestinal: Positive for abdominal pain. Negative for nausea, vomiting, diarrhea and constipation.  Genitourinary: Positive for dysuria. Negative for urgency and frequency.       Neg - vaginal bleeding, discharge, LOF   Physical Exam   Blood pressure 109/65, pulse 80, temperature 98.2 F (36.8 C), temperature source Oral, resp. rate 16, height 5' (1.524 m), weight 144 lb (65.318 kg), last menstrual period 07/02/2014, SpO2 97 %.  Physical Exam  Nursing note and vitals reviewed. Constitutional: She is oriented to person, place, and time. She appears  well-developed and well-nourished. No distress.  HENT:  Head: Normocephalic and atraumatic.  Cardiovascular: Normal rate.   Respiratory: Effort normal.  GI: Soft. She exhibits no distension and no mass. There is no tenderness. There is no rebound and no guarding.  Neurological: She is alert and oriented to person, place, and time.  Skin: Skin is warm and dry. No erythema.  Psychiatric: She has a normal mood and affect.  Dilation: Closed Effacement (%): Thick Cervical Position: Posterior Exam by:: J.Wenzle, PA  Results for orders placed or performed during the hospital encounter of 11/13/14 (from the past 24  hour(s))  Urinalysis, Routine w reflex microscopic     Status: None   Collection Time: 11/13/14  9:31 PM  Result Value Ref Range   Color, Urine YELLOW YELLOW   APPearance CLEAR CLEAR   Specific Gravity, Urine 1.020 1.005 - 1.030   pH 5.5 5.0 - 8.0   Glucose, UA NEGATIVE NEGATIVE mg/dL   Hgb urine dipstick NEGATIVE NEGATIVE   Bilirubin Urine NEGATIVE NEGATIVE   Ketones, ur NEGATIVE NEGATIVE mg/dL   Protein, ur NEGATIVE NEGATIVE mg/dL   Urobilinogen, UA 0.2 0.0 - 1.0 mg/dL   Nitrite NEGATIVE NEGATIVE   Leukocytes, UA NEGATIVE NEGATIVE     MAU Course  Procedures None  MDM FHR - 158 bpm with doppler UA today Discussed patient with Dr. Henderson Cloud. Agrees with plan of care. Patient can follow-up in the office as scheduled 10 mg Flexeril given in MAU - patient reports improvement in abdominal and back pain, but still complains of headache.  1000 mg Tylenol given - patient reports improvement in headache  Assessment and Plan  A: SIUP at [redacted]w[redacted]d Round ligament pain Back pain in pregnancy Headache   P: Discharge home Rx for Flexeril given to patient Tylenol PRN for pain/headache also advised Second trimester warning signs discussed Patient advised to follow-up with [facility] as scheduled for routine prenatal care or sooner PRN Patient may return to MAU as needed or if her condition were to change or worsen   Marny Lowenstein, PA-C  11/13/2014, 10:05 PM

## 2014-11-13 NOTE — MAU Note (Signed)
Low back pain, mid abd and pelvic pain, more on left side.  Started yesterday.  Denies bleeding.  Hurts to pee.  Denies leaking.

## 2015-01-20 ENCOUNTER — Encounter (HOSPITAL_COMMUNITY): Payer: Self-pay

## 2015-01-20 ENCOUNTER — Inpatient Hospital Stay (HOSPITAL_COMMUNITY)
Admission: AD | Admit: 2015-01-20 | Discharge: 2015-01-20 | Disposition: A | Discharge status: Home / Self Care | Payer: Medicaid Other | Source: Ambulatory Visit | Attending: Obstetrics and Gynecology | Admitting: Obstetrics and Gynecology

## 2015-01-20 DIAGNOSIS — R21 Rash and other nonspecific skin eruption: Secondary | ICD-10-CM | POA: Diagnosis not present

## 2015-01-20 DIAGNOSIS — B9789 Other viral agents as the cause of diseases classified elsewhere: Secondary | ICD-10-CM

## 2015-01-20 DIAGNOSIS — O98512 Other viral diseases complicating pregnancy, second trimester: Secondary | ICD-10-CM | POA: Insufficient documentation

## 2015-01-20 DIAGNOSIS — J069 Acute upper respiratory infection, unspecified: Secondary | ICD-10-CM

## 2015-01-20 DIAGNOSIS — O9989 Other specified diseases and conditions complicating pregnancy, childbirth and the puerperium: Secondary | ICD-10-CM | POA: Diagnosis not present

## 2015-01-20 DIAGNOSIS — R05 Cough: Secondary | ICD-10-CM | POA: Diagnosis present

## 2015-01-20 DIAGNOSIS — Z3A27 27 weeks gestation of pregnancy: Secondary | ICD-10-CM

## 2015-01-20 DIAGNOSIS — W57XXXA Bitten or stung by nonvenomous insect and other nonvenomous arthropods, initial encounter: Secondary | ICD-10-CM

## 2015-01-20 LAB — URINALYSIS, ROUTINE W REFLEX MICROSCOPIC
BILIRUBIN URINE: NEGATIVE
GLUCOSE, UA: NEGATIVE mg/dL
Hgb urine dipstick: NEGATIVE
KETONES UR: NEGATIVE mg/dL
Leukocytes, UA: NEGATIVE
Nitrite: NEGATIVE
PH: 7 (ref 5.0–8.0)
Protein, ur: NEGATIVE mg/dL
Specific Gravity, Urine: 1.01 (ref 1.005–1.030)
Urobilinogen, UA: 0.2 mg/dL (ref 0.0–1.0)

## 2015-01-20 LAB — CBC
HCT: 35.2 % — ABNORMAL LOW (ref 36.0–46.0)
Hemoglobin: 11.4 g/dL — ABNORMAL LOW (ref 12.0–15.0)
MCH: 25.2 pg — ABNORMAL LOW (ref 26.0–34.0)
MCHC: 32.4 g/dL (ref 30.0–36.0)
MCV: 77.7 fL — ABNORMAL LOW (ref 78.0–100.0)
PLATELETS: 251 10*3/uL (ref 150–400)
RBC: 4.53 MIL/uL (ref 3.87–5.11)
RDW: 14.5 % (ref 11.5–15.5)
WBC: 10.3 10*3/uL (ref 4.0–10.5)

## 2015-01-20 MED ORDER — HYDROXYZINE HCL 10 MG PO TABS: 10.0000 mg | ORAL_TABLET | Freq: Three times a day (TID) | ORAL | Status: DC | PRN

## 2015-01-20 NOTE — MAU Provider Note (Signed)
History     CSN: [id number]  Arrival date and time: 01/20/15 2054   First Provider Initiated Contact with Patient 01/20/15 2133      Chief Complaint  Patient presents with  . Abdominal Pain  . Cough   HPI  Ms. Tedd SiasK Lis [facility] Hot is a 29 y.o. [phone] at 660w6d here with report of coughing for the past week.  Reports going to the beach for 4th of July and shortly after leaving began coughing and experienced a rash.  Seen by [facility] provider and told it was bed bug infestation. Husband has similar lesions.  Denies fever.  Coughed  this am and had a sharp pain in her lower right abd, over the day it has worsened and is now across her lower abd. Denies vaginal bleeding.   Past Medical History  Diagnosis Date  . Positive PPD   . 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 Abortion history     Past Surgical History  Procedure Laterality Date  . No past surgeries    . Dilation and evacuation N/A 04/14/2013    Procedure: DILATATION AND EVACUATION;  Surgeon: Loney LaurenceMichelle A Horvath, MD;  Location: [facility];  Service: Gynecology;  Laterality: N/A;    Family History  Problem Relation Age of Onset  . Hypertension Father   . Anesthesia problems Neg Hx     History  Substance Use Topics  . Smoking status: Never Smoker   . Smokeless tobacco: Never Used  . Alcohol Use: No    Allergies: No Known Allergies  Prescriptions prior to admission  Medication Sig Dispense Refill Last Dose  . acetaminophen (TYLENOL) 500 MG tablet Take 500-1,000 mg by mouth every 6 (six) hours as needed for mild pain.    Past Month at Unknown time  . cyclobenzaprine (FLEXERIL) 10 MG tablet Take 1 tablet (10 mg total) by mouth 2 (two) times daily as needed for muscle spasms. 30 tablet 0   . ondansetron (ZOFRAN ODT) 4 MG disintegrating tablet Take 1 tablet (4 mg total) by mouth every 8 (eight) hours as needed for nausea or vomiting. (Patient not taking: Reported on 08/09/2014) 20 tablet 0 Not Taking at Unknown time  . Prenatal Vit-Fe Fumarate-FA  (PRENATAL MULTIVITAMIN) TABS tablet Take 1 tablet by mouth daily at 12 noon.   11/12/2014 at Unknown time  . promethazine (PHENERGAN) 25 MG tablet Take 0.5-1 tablets (12.5-25 mg total) by mouth every 6 (six) hours as needed. (Patient not taking: Reported on 11/13/2014) 30 tablet 0 Not Taking at Unknown time    Review of Systems  Constitutional: Positive for chills. Negative for fever.  HENT: Positive for nosebleeds and sore throat. Negative for congestion.   Respiratory: Positive for cough. Negative for shortness of breath.   Gastrointestinal: Positive for abdominal pain. Negative for nausea, vomiting, diarrhea and constipation.  Genitourinary: Negative for dysuria and urgency.  Musculoskeletal: Positive for myalgias.  Neurological: Negative for headaches.   Physical Exam   Blood pressure 118/71, pulse 88, temperature 98.2 F (36.8 C), temperature source Oral, resp. rate 16, height 5' (1.524 m), weight 73.029 kg (161 lb), last menstrual period 07/02/2014, SpO2 98 %.  Physical Exam  Constitutional: She is oriented to person, place, and time. She appears well-developed and well-nourished.  HENT:  Head: Normocephalic.  Mouth/Throat: Mucous membranes are not dry. No oropharyngeal exudate or posterior oropharyngeal edema.  Neck: Normal range of motion. Neck supple.  Cardiovascular: Normal rate, regular rhythm and normal heart sounds.   Respiratory: Effort normal and  breath sounds normal. No respiratory distress.  GI: Soft. There is no tenderness.  Genitourinary: No bleeding in the vagina. Vaginal discharge (mucusy) found.  Neurological: She is alert and oriented to person, place, and time.  Skin: Skin is warm and dry. Lesion noted.       MAU Course  Procedures Results for orders placed or performed during the hospital encounter of 01/20/15 (from the past 24 hour(s))  Urinalysis, Routine w reflex microscopic (not at [facility])     Status: None   Collection Time: 01/20/15  9:06 PM  Result  Value Ref Range   Color, Urine YELLOW YELLOW   APPearance CLEAR CLEAR   Specific Gravity, Urine 1.010 1.005 - 1.030   pH 7.0 5.0 - 8.0   Glucose, UA NEGATIVE NEGATIVE mg/dL   Hgb urine dipstick NEGATIVE NEGATIVE   Bilirubin Urine NEGATIVE NEGATIVE   Ketones, ur NEGATIVE NEGATIVE mg/dL   Protein, ur NEGATIVE NEGATIVE mg/dL   Urobilinogen, UA 0.2 0.0 - 1.0 mg/dL   Nitrite NEGATIVE NEGATIVE   Leukocytes, UA NEGATIVE NEGATIVE  CBC     Status: Abnormal   Collection Time: 01/20/15  9:45 PM  Result Value Ref Range   WBC 10.3 4.0 - 10.5 K/uL   RBC 4.53 3.87 - 5.11 MIL/uL   Hemoglobin 11.4 (L) 12.0 - 15.0 g/dL   HCT 16.1 (L) 09.6 - 04.5 %   MCV 77.7 (L) 78.0 - 100.0 fL   MCH 25.2 (L) 26.0 - 34.0 pg   MCHC 32.4 30.0 - 36.0 g/dL   RDW 40.9 81.1 - 91.4 %   Platelets 251 150 - 400 K/uL   2315 Consulted with Dr. Tenny Craw > Reviewed HPI/Exam/labs > discharge to home with cough suppressant and follow-up in office.    Assessment and Plan  29 y.o. [id number] at [redacted]w[redacted]d IUP Viral Illness Bed Bug Lesions  Plan: Discharge to home RX Atarax 10 mg for itching Increase fluids Mucinex   Rochele Pages N 01/20/2015, 11:27 PM

## 2015-01-20 NOTE — Discharge Instructions (Signed)
You may take Atarax as needed for itching.  Your cough is likely related to a cold or allergies.  If it does not get better in the next week or so, ask your doctor if you may have allergies.  Bedbugs Bedbugs are tiny bugs that live in and around beds. During the day, they hide in mattresses and other places near beds. They come out at night and bite people lying in bed. They need blood to live and grow. Bedbugs can be found in beds anywhere. Usually, they are found in places where many people come and go (hotels, shelters, hospitals). It does not matter whether the place is dirty or clean. Getting bitten by bedbugs rarely causes a medical problem. The biggest problem can be getting rid of them. This often takes the work of a Oncologistpest control expert. CAUSES  Less use of pesticides. Bedbugs were common before the 1950s. Then, strong pesticides such as DDT nearly wiped them out. Today, these pesticides are not used because they harm the environment and can cause health problems.  More travel. Besides mattresses, bedbugs can also live in clothing and luggage. They can come along as people travel from place to place. Bedbugs are more common in certain parts of the world. When people travel to those areas, the bugs can come home with them.  Presence of birds and bats. Bedbugs often infest birds and bats. If you have these animals in or near your home, bedbugs may infest your house, too. SYMPTOMS It does not hurt to be bitten by a bedbug. You will probably not wake up when you are bitten. Bedbugs usually bite areas of the skin that are not covered. Symptoms may show when you wake up, or they may take a day or more to show up. Symptoms may include:  Small red bumps on the skin. These might be lined up in a row or clustered in a group.  A darker red dot in the middle of red bumps.  Blisters on the skin. There may be swelling and very bad itching. These may be signs of an allergic reaction. This does not  happen often. DIAGNOSIS Bedbug bites might look and feel like other types of insect bites. The bugs do not stay on the body like ticks or lice. They bite, drop off, and crawl away to hide. Your caregiver will probably:  Ask about your symptoms.  Ask about your recent activities and travel.  Check your skin for bedbug bites.  Ask you to check at home for signs of bedbugs. You should look for:  Spots or stains on the bed or nearby. This could be from bedbugs that were crushed or from their eggs or waste.  Bedbugs themselves. They are reddish-brown, oval, and flat. They do not fly. They are about the size of an apple seed.  Places to look for bedbugs include:  Beds. Check mattresses, headboards, box springs, and bed frames.  On drapes and curtains near the bed.  Under carpeting in the bedroom.  Behind electrical outlets.  Behind any wallpaper that is peeling.  Inside luggage. TREATMENT Most bedbug bites do not need treatment. They usually go away on their own in a few days. The bites are not dangerous. However, treatment may be needed if you have scratched so much that your skin has become infected. You may also need treatment if you are allergic to bedbug bites. Treatment options include:  A drug that stops swelling and itching (corticosteroid). Usually, a cream is rubbed  on the skin. If you have a bad rash, you may be given a corticosteroid pill.  Oral antihistamines. These are pills to help control itching.  Antibiotic medicines. An antibiotic may be prescribed for infected skin. HOME CARE INSTRUCTIONS   Take any medicine prescribed by your caregiver for your bites. Follow the directions carefully.  Consider wearing pajamas with long sleeves and pant legs.  Your bedroom may need to be treated. A pest control expert should make sure the bedbugs are gone. You may need to throw away mattresses or luggage. Ask the pest control expert what you can do to keep the bedbugs from  coming back. Common suggestions include:  Putting a plastic cover over your mattress.  Washing and drying your clothes and bedding in hot water and a hot dryer. The temperature should be hotter than 120 F (48.9 C). Bedbugs are killed by high temperatures.  Vacuuming carefully all around your bed. Vacuum in all cracks and crevices where the bugs might hide. Do this often.  Carefully checking all used furniture, bedding, or clothes that you bring into your house.  Eliminating bird nests and bat roosts.  If you get bedbug bites when traveling, check all your possessions carefully before bringing them into your house. If you find any bugs on clothes or in your luggage, consider throwing those items away. SEEK MEDICAL CARE IF:  You have red bug bites that keep coming back.  You have red bug bites that itch badly.  You have bug bites that cause a skin rash.  You have scratch marks that are red and sore. SEEK IMMEDIATE MEDICAL CARE IF: You have a fever. Document Released: 08/01/2010 Document Revised: 09/21/2011 Document Reviewed: 08/01/2010 [facility] Patient Information 2015 [address], [place]. This information is not intended to replace advice given to you by your health care provider. Make sure you discuss any questions you have with your health care provider.

## 2015-01-20 NOTE — MAU Note (Signed)
Pt reports she has been coughing for the last week, states she coughed this am and had a sharp pain in her lower right abd, over the day it has worsened and is now across her lower abd. Denies bleeding.

## 2015-03-13 LAB — OB RESULTS CONSOLE GBS: GBS: POSITIVE

## 2015-03-27 ENCOUNTER — Encounter (HOSPITAL_COMMUNITY): Payer: Self-pay

## 2015-03-27 ENCOUNTER — Inpatient Hospital Stay (HOSPITAL_COMMUNITY): Payer: Medicaid Other

## 2015-03-27 ENCOUNTER — Inpatient Hospital Stay (HOSPITAL_COMMUNITY)
Admission: AD | Admit: 2015-03-27 | Discharge: 2015-03-27 | Disposition: A | Discharge status: Home / Self Care | Payer: Medicaid Other | Source: Ambulatory Visit | Attending: Obstetrics and Gynecology | Admitting: Obstetrics and Gynecology

## 2015-03-27 DIAGNOSIS — O368190 Decreased fetal movements, unspecified trimester, not applicable or unspecified: Secondary | ICD-10-CM

## 2015-03-27 DIAGNOSIS — O368130 Decreased fetal movements, third trimester, not applicable or unspecified: Secondary | ICD-10-CM | POA: Diagnosis present

## 2015-03-27 DIAGNOSIS — Z3A37 37 weeks gestation of pregnancy: Secondary | ICD-10-CM | POA: Diagnosis not present

## 2015-03-27 NOTE — Discharge Instructions (Signed)
°Fetal Biophysical Profile °This is a test that measures five different variables of the fetus: Heart rate, breathing movement, total movement of the baby, fetal muscle tone, the amount of amniotic fluid, and the heart rate activity of the fetus. The five variables are measured individually and contribute either a 2 or a 0 to the overall scoring of the test. The measurements are as follows: °· Fetal heart rate activity. This is measured and scored in the same way as a non-stress test. The fetal heart rate is considered reactive when there are movement-associated fetal heart rate increases of at least 15 beats per minute above baseline, and 15 seconds in duration over a 20-minute period. A score of 2 is given for reactivity, and a score of 0 indicates that the fetal heart rate is non-reactive. °· Fetal breathing movements. This is scored based on fetal breathing movements and indicate fetal well-being. Their absence may indicate a low oxygen level for the fetus. Fetal breathing increases in frequency and uniformity after the 36th week of pregnancy. To earn a score of 2, the fetus must have at least one episode of fetal breathing lasting at least 60 seconds within a 30-minute observation. Absence of this breathing is scored a 0 on the BPP. °· Fetal body movements. Fetal activity is a reflection of brain integrity and function. The presence of at least three episodes of fetal movements within a 30-minute period is given a score of 2. A score of 0 is given with two or less movements in this time period. Fetal activity is highest 1 to 3 hours after the mother has eaten a meal. °· Fetal tone. In the uterus, the fetus is normally in a position of flexion. This means the head is bent down towards the knees. The fetus also stretches, rolls, and moves in the uterus. The arms, legs, trunk, and head may be flexed and extended. A score of 2 is earned when there is at least one episode of active extension with return flexion. A  score of 0 is given for slow extension with a return to only partial flexion. Fetal movement not followed by return to flexion, limbs or spine in extension, and an open fetal hand score 0. °· Amniotic fluid volume. Amniotic fluid volume has been demonstrated to be a good method of predicting fetal distress. Too little amniotic fluid has been associated with fetal abnormalities, slow uterine growth, and over due pregnancy. A score of 2 is given for this when there is at least one pocket of amniotic fluid that measures 1 cm in a specific area. A score of 0 indicates either that fluid is absent in most areas of the uterine cavity or that the largest pocket of fluid measures less than 1 cm. °PREPARATION FOR TEST °No preparation or fasting is necessary. °NORMAL FINDINGS °· A score of 8-10 points (if amniotic fluid volume is adequate). °· Possible critical values: Less than 4 may necessitate immediate delivery of fetus. °Ranges for normal findings may vary among different laboratories and hospitals. You should always check with your doctor after having lab work or other tests done to discuss the meaning of your test results and whether your values are considered within normal limits. °MEANING OF TEST  °Your caregiver will go over the test results with you and discuss the importance and meaning of your results, as well as treatment options and the need for additional tests if necessary. °OBTAINING THE TEST RESULTS  °It is your responsibility to obtain your test   results. Ask the lab or department performing the test when and how you will get your results. °Document Released: 10/30/2004 Document Revised: 09/21/2011 Document Reviewed: 06/08/2008 °ExitCare® Patient Information ©2015 [employer]. This information is not intended to replace advice given to you by your health care provider. Make sure you discuss any questions you have with your health care provider. °Fetal Movement Counts °Patient Name:  __________________________________________________ Patient Due Date: ____________________ °Performing a fetal movement count is highly recommended in high-risk pregnancies, but it is good for every pregnant woman to do. Your health care provider may ask you to start counting fetal movements at 28 weeks of the pregnancy. Fetal movements often increase: °· After eating a full meal. °· After physical activity. °· After eating or drinking something sweet or cold. °· At rest. °Pay attention to when you feel the baby is most active. This will help you notice a pattern of your baby's sleep and wake cycles and what factors contribute to an increase in fetal movement. It is important to perform a fetal movement count at the same time each day when your baby is normally most active.  °HOW TO COUNT FETAL MOVEMENTS °· Find a quiet and comfortable area to sit or lie down on your left side. Lying on your left side provides the best blood and oxygen circulation to your baby. °· Write down the day and time on a sheet of paper or in a journal. °· Start counting kicks, flutters, swishes, rolls, or jabs in a 2-hour period. You should feel at least 10 movements within 2 hours. °· If you do not feel 10 movements in 2 hours, wait 2-3 hours and count again. Look for a change in the pattern or not enough counts in 2 hours. °SEEK MEDICAL CARE IF: °· You feel less than 10 counts in 2 hours, tried twice. °· There is no movement in over an hour. °· The pattern is changing or taking longer each day to reach 10 counts in 2 hours. °· You feel the baby is not moving as he or she usually does. °Date: ____________ Movements: ____________ Start time: ____________ Finish time: ____________  °Date: ____________ Movements: ____________ Start time: ____________ Finish time: ____________ °Date: ____________ Movements: ____________ Start time: ____________ Finish time: ____________ °Date: ____________ Movements: ____________ Start time: ____________ Finish  time: ____________ °Date: ____________ Movements: ____________ Start time: ____________ Finish time: ____________ °Date: ____________ Movements: ____________ Start time: ____________ Finish time: ____________ °Date: ____________ Movements: ____________ Start time: ____________ Finish time: ____________ °Date: ____________ Movements: ____________ Start time: ____________ Finish time: ____________  °Date: ____________ Movements: ____________ Start time: ____________ Finish time: ____________ °Date: ____________ Movements: ____________ Start time: ____________ Finish time: ____________ °Date: ____________ Movements: ____________ Start time: ____________ Finish time: ____________ °Date: ____________ Movements: ____________ Start time: ____________ Finish time: ____________ °Date: ____________ Movements: ____________ Start time: ____________ Finish time: ____________ °Date: ____________ Movements: ____________ Start time: ____________ Finish time: ____________ °Date: ____________ Movements: ____________ Start time: ____________ Finish time: ____________  °Date: ____________ Movements: ____________ Start time: ____________ Finish time: ____________ °Date: ____________ Movements: ____________ Start time: ____________ Finish time: ____________ °Date: ____________ Movements: ____________ Start time: ____________ Finish time: ____________ °Date: ____________ Movements: ____________ Start time: ____________ Finish time: ____________ °Date: ____________ Movements: ____________ Start time: ____________ Finish time: ____________ °Date: ____________ Movements: ____________ Start time: ____________ Finish time: ____________ °Date: ____________ Movements: ____________ Start time: ____________ Finish time: ____________  °Date: ____________ Movements: ____________ Start time: ____________ Finish time: ____________ °Date: ____________ Movements: ____________ Start time: ____________ Finish   time: ____________ °Date: ____________  Movements: ____________ Start time: ____________ Finish time: ____________ °Date: ____________ Movements: ____________ Start time: ____________ Finish time: ____________ °Date: ____________ Movements: ____________ Start time: ____________ Finish time: ____________ °Date: ____________ Movements: ____________ Start time: ____________ Finish time: ____________ °Date: ____________ Movements: ____________ Start time: ____________ Finish time: ____________  °Date: ____________ Movements: ____________ Start time: ____________ Finish time: ____________ °Date: ____________ Movements: ____________ Start time: ____________ Finish time: ____________ °Date: ____________ Movements: ____________ Start time: ____________ Finish time: ____________ °Date: ____________ Movements: ____________ Start time: ____________ Finish time: ____________ °Date: ____________ Movements: ____________ Start time: ____________ Finish time: ____________ °Date: ____________ Movements: ____________ Start time: ____________ Finish time: ____________ °Date: ____________ Movements: ____________ Start time: ____________ Finish time: ____________  °Date: ____________ Movements: ____________ Start time: ____________ Finish time: ____________ °Date: ____________ Movements: ____________ Start time: ____________ Finish time: ____________ °Date: ____________ Movements: ____________ Start time: ____________ Finish time: ____________ °Date: ____________ Movements: ____________ Start time: ____________ Finish time: ____________ °Date: ____________ Movements: ____________ Start time: ____________ Finish time: ____________ °Date: ____________ Movements: ____________ Start time: ____________ Finish time: ____________ °Date: ____________ Movements: ____________ Start time: ____________ Finish time: ____________  °Date: ____________ Movements: ____________ Start time: ____________ Finish time: ____________ °Date: ____________ Movements: ____________ Start time:  ____________ Finish time: ____________ °Date: ____________ Movements: ____________ Start time: ____________ Finish time: ____________ °Date: ____________ Movements: ____________ Start time: ____________ Finish time: ____________ °Date: ____________ Movements: ____________ Start time: ____________ Finish time: ____________ °Date: ____________ Movements: ____________ Start time: ____________ Finish time: ____________ °Date: ____________ Movements: ____________ Start time: ____________ Finish time: ____________  °Date: ____________ Movements: ____________ Start time: ____________ Finish time: ____________ °Date: ____________ Movements: ____________ Start time: ____________ Finish time: ____________ °Date: ____________ Movements: ____________ Start time: ____________ Finish time: ____________ °Date: ____________ Movements: ____________ Start time: ____________ Finish time: ____________ °Date: ____________ Movements: ____________ Start time: ____________ Finish time: ____________ °Date: ____________ Movements: ____________ Start time: ____________ Finish time: ____________ °Document Released: 07/29/2006 Document Revised: 11/13/2013 Document Reviewed: 04/25/2012 °ExitCare® Patient Information ©2015 [employer]. This information is not intended to replace advice given to you by your health care provider. Make sure you discuss any questions you have with your health care provider. ° °

## 2015-03-27 NOTE — MAU Note (Signed)
Patient was at MD office today had [place] did not see baby moving like it should was sent for efm

## 2015-03-27 NOTE — MAU Provider Note (Signed)
History     CSN: [id number]  Arrival date and time: 03/27/15 1737   First Provider Initiated Contact with Patient 03/27/15 1814      Chief Complaint  Patient presents with  . Decreased Fetal Movement   HPI [facility] 29 y.o. [id number] @[redacted]w[redacted]d  presents to MAU complaining of decreased fetal movement.  She noticed this started yesterday.  She was seen at [facility] office today, had U/S and was sent here because baby was not moving well on u/s.  Pt notes she feels some fetal movements since here.  She notes she has not been eating much lately and also not drinking as much as usual.  She She denies LOF, VB, dysuria.  She has occasional contractions.  Her cervix was checked in the office and found to be 2cm.  No contractions today OB History    Gravida Para Term Preterm AB TAB SAB Ectopic Multiple Living   4 1 0 1 2 1 1 0 0 1       Past Medical History  Diagnosis Date  . Positive PPD   . 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 Abortion history     Past Surgical History  Procedure Laterality Date  . No past surgeries    . Dilation and evacuation N/A 04/14/2013    Procedure: DILATATION AND EVACUATION;  Surgeon: Loney Laurence, MD;  Location: [facility] ORS;  Service: Gynecology;  Laterality: N/A;    Family History  Problem Relation Age of Onset  . Hypertension Father   . Anesthesia problems Neg Hx     Social History  Substance Use Topics  . Smoking status: Never Smoker   . Smokeless tobacco: Never Used  . Alcohol Use: No    Allergies: No Known Allergies  Prescriptions prior to admission  Medication Sig Dispense Refill Last Dose  . acetaminophen (TYLENOL) 500 MG tablet Take 500-1,000 mg by mouth every 6 (six) hours as needed for mild pain.    01/20/2015 at Unknown time  . cyclobenzaprine (FLEXERIL) 10 MG tablet Take 1 tablet (10 mg total) by mouth 2 (two) times daily as needed for muscle spasms. (Patient not taking: Reported on 01/20/2015) 30 tablet 0 Not Taking at Unknown time  . famotidine  (PEPCID AC) 10 MG chewable tablet Chew 10 mg by mouth 2 (two) times daily as needed for heartburn.   Past Week at Unknown time  . hydrOXYzine (ATARAX/VISTARIL) 10 MG tablet Take 1 tablet (10 mg total) by mouth 3 (three) times daily as needed. 30 tablet 0   . ondansetron (ZOFRAN ODT) 4 MG disintegrating tablet Take 1 tablet (4 mg total) by mouth every 8 (eight) hours as needed for nausea or vomiting. (Patient not taking: Reported on 08/09/2014) 20 tablet 0 Not Taking at Unknown time  . Prenatal Vit-Fe Fumarate-FA (PRENATAL MULTIVITAMIN) TABS tablet Take 1 tablet by mouth daily at 12 noon.   01/19/2015 at Unknown time  . PRESCRIPTION MEDICATION Apply 1 application topically 3 (three) times daily as needed (itching). Pt states using Rx topical steroid; unknown drug/strength   01/19/2015 at Unknown time  . promethazine (PHENERGAN) 25 MG tablet Take 0.5-1 tablets (12.5-25 mg total) by mouth every 6 (six) hours as needed. (Patient not taking: Reported on 11/13/2014) 30 tablet 0 Not Taking at Unknown time    ROS Pertinent ROS in HPI.  All other systems are negative.   Physical Exam   Blood pressure 122/75, pulse 86, temperature 98.2 F (36.8 C), resp. rate 16, last menstrual period  07/02/2014.  Physical Exam  Constitutional: She is oriented to person, place, and time. She appears well-developed and well-nourished. No distress.  HENT:  Head: Normocephalic and atraumatic.  Eyes: EOM are normal.  Neck: Normal range of motion.  Cardiovascular: Normal rate.   Respiratory: No respiratory distress.  GI: Soft. There is no tenderness.  Genitourinary:  Cervical check - 1-2cm (pt reports 2cm in office)  Musculoskeletal: Normal range of motion.  Neurological: She is alert and oriented to person, place, and time.  Skin: Skin is warm and dry.  Psychiatric: She has a normal mood and affect.    MAU Course  Procedures  MDM Fetal Tracing: Baseline:120 Variability:mod Accelerations:  15x15 Decelerations:none Toco:irritability  BPP: 8/8  Dr. Dareen Piano consulted.  He is in agreement to discharge patient to home with directions for daily fetal kick counts.     Assessment and Plan  A: decreased fetal movement - resolved  P: Discharge to home Fetal kick counts Keep appt for [facility] Patient may return to MAU as needed or if her condition were to change or worsen   Bertram Denver 03/27/2015, 6:16 PM

## 2015-04-09 ENCOUNTER — Inpatient Hospital Stay (HOSPITAL_COMMUNITY)
Admission: AD | Admit: 2015-04-09 | Discharge: 2015-04-11 | DRG: 774 | Disposition: A | Discharge status: Home / Self Care | Payer: Medicaid Other | Source: Ambulatory Visit | Attending: Obstetrics and Gynecology | Admitting: Obstetrics and Gynecology

## 2015-04-09 ENCOUNTER — Encounter (HOSPITAL_COMMUNITY): Payer: Self-pay | Admitting: *Deleted

## 2015-04-09 ENCOUNTER — Inpatient Hospital Stay (HOSPITAL_COMMUNITY)
Admission: AD | Admit: 2015-04-09 | Discharge: 2015-04-09 | Disposition: A | Discharge status: Home / Self Care | Payer: Medicaid Other | Source: Ambulatory Visit | Attending: Obstetrics and Gynecology | Admitting: Obstetrics and Gynecology

## 2015-04-09 DIAGNOSIS — O99214 Obesity complicating childbirth: Secondary | ICD-10-CM | POA: Diagnosis present

## 2015-04-09 DIAGNOSIS — O9842 Viral hepatitis complicating childbirth: Principal | ICD-10-CM | POA: Diagnosis present

## 2015-04-09 DIAGNOSIS — E669 Obesity, unspecified: Secondary | ICD-10-CM | POA: Diagnosis present

## 2015-04-09 DIAGNOSIS — Z3A39 39 weeks gestation of pregnancy: Secondary | ICD-10-CM | POA: Diagnosis present

## 2015-04-09 DIAGNOSIS — B1910 Unspecified viral hepatitis B without hepatic coma: Secondary | ICD-10-CM | POA: Diagnosis present

## 2015-04-09 LAB — CBC
HEMATOCRIT: 36.9 % (ref 36.0–46.0)
HEMOGLOBIN: 12.4 g/dL (ref 12.0–15.0)
MCH: 24.1 pg — ABNORMAL LOW (ref 26.0–34.0)
MCHC: 33.6 g/dL (ref 30.0–36.0)
MCV: 71.8 fL — AB (ref 78.0–100.0)
Platelets: 265 10*3/uL (ref 150–400)
RBC: 5.14 MIL/uL — AB (ref 3.87–5.11)
RDW: 15.5 % (ref 11.5–15.5)
WBC: 10.4 10*3/uL (ref 4.0–10.5)

## 2015-04-09 LAB — POCT FERN TEST: POCT Fern Test: POSITIVE

## 2015-04-09 MED ORDER — ONDANSETRON HCL 4 MG/2ML IJ SOLN: 4.0000 mg | Freq: Four times a day (QID) | INTRAMUSCULAR | Status: DC | PRN

## 2015-04-09 MED ORDER — ACETAMINOPHEN 325 MG PO TABS: 650.0000 mg | ORAL_TABLET | ORAL | Status: DC | PRN

## 2015-04-09 MED ORDER — OXYTOCIN BOLUS FROM INFUSION
500.0000 mL | INTRAVENOUS | Status: DC
  Administered 2015-04-10: 500 mL via INTRAVENOUS

## 2015-04-09 MED ORDER — OXYCODONE-ACETAMINOPHEN 5-325 MG PO TABS: 1.0000 | ORAL_TABLET | ORAL | Status: DC | PRN

## 2015-04-09 MED ORDER — OXYCODONE-ACETAMINOPHEN 5-325 MG PO TABS: 2.0000 | ORAL_TABLET | ORAL | Status: DC | PRN

## 2015-04-09 MED ORDER — LACTATED RINGERS IV SOLN
INTRAVENOUS | Status: DC
  Administered 2015-04-09: 125 mL/h via INTRAVENOUS

## 2015-04-09 MED ORDER — FLEET ENEMA 7-19 GM/118ML RE ENEM: 1.0000 | ENEMA | RECTAL | Status: DC | PRN

## 2015-04-09 MED ORDER — CITRIC ACID-SODIUM CITRATE 334-500 MG/5ML PO SOLN
30.0000 mL | ORAL | Status: DC | PRN
  Filled 2015-04-09: qty 15

## 2015-04-09 MED ORDER — OXYTOCIN 40 UNITS IN LACTATED RINGERS INFUSION - SIMPLE MED
62.5000 mL/h | INTRAVENOUS | Status: DC
  Filled 2015-04-09: qty 1000

## 2015-04-09 MED ORDER — PENICILLIN G POTASSIUM 5000000 UNITS IJ SOLR
5.0000 10*6.[IU] | Freq: Once | INTRAVENOUS | Status: AC
  Administered 2015-04-10: 5 10*6.[IU] via INTRAVENOUS
  Filled 2015-04-09: qty 5

## 2015-04-09 MED ORDER — PENICILLIN G POTASSIUM 5000000 UNITS IJ SOLR
2.5000 10*6.[IU] | INTRAMUSCULAR | Status: DC
  Administered 2015-04-10 (×2): 2.5 10*6.[IU] via INTRAVENOUS
  Filled 2015-04-09 (×7): qty 2.5

## 2015-04-09 MED ORDER — LACTATED RINGERS IV SOLN: 500.0000 mL | INTRAVENOUS | Status: DC | PRN

## 2015-04-09 MED ORDER — LIDOCAINE HCL (PF) 1 % IJ SOLN
30.0000 mL | INTRAMUSCULAR | Status: DC | PRN
  Administered 2015-04-10: 30 mL via SUBCUTANEOUS
  Filled 2015-04-09: qty 30

## 2015-04-09 NOTE — MAU Note (Signed)
Pt reports ?ROM at 2145. Some contractions starting just prior to arrival.

## 2015-04-09 NOTE — H&P (Signed)
29 y.o. [redacted]w[redacted]d  [id number] comes in c/o SROM.  Otherwise has good fetal movement and no bleeding.  Past Medical History  Diagnosis Date  . Positive PPD   . 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 Abortion history     Past Surgical History  Procedure Laterality Date  . No past surgeries    . Dilation and evacuation N/A 04/14/2013    Procedure: DILATATION AND EVACUATION;  Surgeon: Loney Laurence, MD;  Location: [facility]S;  Service: Gynecology;  Laterality: N/A;    OB History  Gravida Para Term Preterm AB SAB TAB Ectopic Multiple Living     # Outcome Date GA Lbr Len/2nd Weight Sex Delivery Anes PTL Lv  4 Current           3 Preterm 02/12/12 [redacted]w[redacted]d 10:57 / 00:22 1.71 kg (3 lb 12.3 oz) M Vag-Spont EPI  Y  2 SAB           1 TAB               Social History   Social History  . Marital Status: Married    Spouse Name: N/A  . Number of Children: N/A  . Years of Education: N/A   Occupational History  . Not on file.   Social History Main Topics  . Smoking status: Never Smoker   . Smokeless tobacco: Never Used  . Alcohol Use: No  . Drug Use: No  . Sexual Activity: Yes   Other Topics Concern  . Not on file   Social History Narrative   Marital status: married x 6 years      Children 1 son (2yo)      Lives: with husband, son, mom, dad.      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    Prenatal Transfer Tool  Maternal Diabetes: No Genetic Screening: Normal Maternal Ultrasounds/Referrals: Abnormal:  Findings:   Isolated EIF (echogenic intracardiac focus) Fetal Ultrasounds or other Referrals:  None Maternal Substance Abuse:  No Significant Maternal Medications:  None Significant Maternal Lab Results: Lab values include: Group B Strep positive  Other PNC: Hepatitis B carrier followed by GI, DNA 23, no meds needed, h/o prior PTD 31weeks, treated with Makena up to  36 weeks    Filed Vitals:   04/09/15 2227  BP: 122/79  Pulse: 76  Temp: 98.2 F (36.8 C)  Resp: 16     Exam by MAU staff: Abdomen:  soft, gravid SVE:  2/50/-2 FHTs:  150, good STV, NST R, occ short variable noted Toco:  irregular   A/P   Admit with SROM  Hep B f/b GI, inactive carrier, asymptomatic  GBS POS- PCN  After 4 hrs of PCN can recheck cervix and start pitocin 2x2 if needed for augmentation  Epidural when desired  CALLAHAN, SIDNEY

## 2015-04-09 NOTE — MAU Note (Signed)
C/O back pain, "baby balling up." Decreased FM. All started yesterday.

## 2015-04-09 NOTE — MAU Note (Signed)
Patient being seen for gush of fluid from vagina around 2100 that still continues to drain from vagina. Discharge described as pink in color with no odor. No vaginal bleeding. History of preterm labor via vaginal delivery. No urinary s/s. + fetal movement

## 2015-04-09 NOTE — Discharge Instructions (Signed)
Keep your scheduled appt for prenatal care. Call the office or provider on call with further concerns. °

## 2015-04-10 ENCOUNTER — Inpatient Hospital Stay (HOSPITAL_COMMUNITY): Payer: Medicaid Other | Admitting: Anesthesiology

## 2015-04-10 ENCOUNTER — Encounter (HOSPITAL_COMMUNITY): Payer: Self-pay | Admitting: Anesthesiology

## 2015-04-10 LAB — TYPE AND SCREEN
ABO/RH(D): B POS
ANTIBODY SCREEN: NEGATIVE

## 2015-04-10 LAB — RPR: RPR: NONREACTIVE

## 2015-04-10 MED ORDER — TETANUS-DIPHTH-ACELL PERTUSSIS 5-2.5-18.5 LF-MCG/0.5 IM SUSP: 0.5000 mL | Freq: Once | INTRAMUSCULAR | Status: DC

## 2015-04-10 MED ORDER — EPHEDRINE 5 MG/ML INJ
10.0000 mg | INTRAVENOUS | Status: DC | PRN
Start: 2015-04-10 — End: 2015-04-10

## 2015-04-10 MED ORDER — OXYCODONE-ACETAMINOPHEN 5-325 MG PO TABS
1.0000 | ORAL_TABLET | ORAL | Status: DC | PRN
  Administered 2015-04-10 – 2015-04-11 (×3): 1 via ORAL
  Filled 2015-04-10 (×3): qty 1

## 2015-04-10 MED ORDER — SENNOSIDES-DOCUSATE SODIUM 8.6-50 MG PO TABS
2.0000 | ORAL_TABLET | ORAL | Status: DC
  Administered 2015-04-10: 2 via ORAL
  Filled 2015-04-10: qty 2

## 2015-04-10 MED ORDER — IBUPROFEN 600 MG PO TABS
600.0000 mg | ORAL_TABLET | Freq: Four times a day (QID) | ORAL | Status: DC
  Administered 2015-04-10 – 2015-04-11 (×4): 600 mg via ORAL
  Filled 2015-04-10 (×4): qty 1

## 2015-04-10 MED ORDER — FENTANYL 2.5 MCG/ML BUPIVACAINE 1/10 % EPIDURAL INFUSION (WH - ANES)
14.0000 mL/h | INTRAMUSCULAR | Status: DC | PRN
  Administered 2015-04-10 (×3): 14 mL/h via EPIDURAL
  Filled 2015-04-10 (×2): qty 125

## 2015-04-10 MED ORDER — PHENYLEPHRINE 40 MCG/ML (10ML) SYRINGE FOR IV PUSH (FOR BLOOD PRESSURE SUPPORT)
80.0000 ug | PREFILLED_SYRINGE | INTRAVENOUS | Status: DC | PRN
  Administered 2015-04-10: 80 ug via INTRAVENOUS
  Filled 2015-04-10: qty 20

## 2015-04-10 MED ORDER — LIDOCAINE HCL (PF) 1 % IJ SOLN
INTRAMUSCULAR | Status: DC | PRN
  Administered 2015-04-10 (×2): 8 mL via EPIDURAL

## 2015-04-10 MED ORDER — WITCH HAZEL-GLYCERIN EX PADS: 1.0000 "application " | MEDICATED_PAD | CUTANEOUS | Status: DC | PRN

## 2015-04-10 MED ORDER — ONDANSETRON HCL 4 MG PO TABS: 4.0000 mg | ORAL_TABLET | ORAL | Status: DC | PRN

## 2015-04-10 MED ORDER — DIPHENHYDRAMINE HCL 25 MG PO CAPS: 25.0000 mg | ORAL_CAPSULE | Freq: Four times a day (QID) | ORAL | Status: DC | PRN

## 2015-04-10 MED ORDER — ONDANSETRON HCL 4 MG/2ML IJ SOLN: 4.0000 mg | INTRAMUSCULAR | Status: DC | PRN

## 2015-04-10 MED ORDER — OXYCODONE-ACETAMINOPHEN 5-325 MG PO TABS: 2.0000 | ORAL_TABLET | ORAL | Status: DC | PRN

## 2015-04-10 MED ORDER — ACETAMINOPHEN 325 MG PO TABS: 650.0000 mg | ORAL_TABLET | ORAL | Status: DC | PRN

## 2015-04-10 MED ORDER — DIBUCAINE 1 % RE OINT: 1.0000 "application " | TOPICAL_OINTMENT | RECTAL | Status: DC | PRN

## 2015-04-10 MED ORDER — LANOLIN HYDROUS EX OINT: TOPICAL_OINTMENT | CUTANEOUS | Status: DC | PRN

## 2015-04-10 MED ORDER — BENZOCAINE-MENTHOL 20-0.5 % EX AERO
1.0000 "application " | INHALATION_SPRAY | CUTANEOUS | Status: DC | PRN
  Administered 2015-04-10: 1 via TOPICAL
  Filled 2015-04-10: qty 56

## 2015-04-10 MED ORDER — PRENATAL MULTIVITAMIN CH
1.0000 | ORAL_TABLET | Freq: Every day | ORAL | Status: DC
  Administered 2015-04-11: 1 via ORAL
  Filled 2015-04-10: qty 1

## 2015-04-10 MED ORDER — DIPHENHYDRAMINE HCL 50 MG/ML IJ SOLN: 12.5000 mg | INTRAMUSCULAR | Status: DC | PRN

## 2015-04-10 MED ORDER — OXYTOCIN 40 UNITS IN LACTATED RINGERS INFUSION - SIMPLE MED
1.0000 m[IU]/min | INTRAVENOUS | Status: DC
  Administered 2015-04-10: 2 m[IU]/min via INTRAVENOUS

## 2015-04-10 MED ORDER — SIMETHICONE 80 MG PO CHEW: 80.0000 mg | CHEWABLE_TABLET | ORAL | Status: DC | PRN

## 2015-04-10 MED ORDER — TERBUTALINE SULFATE 1 MG/ML IJ SOLN: 0.2500 mg | Freq: Once | INTRAMUSCULAR | Status: DC | PRN

## 2015-04-10 NOTE — Progress Notes (Signed)
MD reviewed strip. Per MD, RN to restart pt pitocin - 1/1 milliunits for first hour, if FHR tracing continues to look reassuring, increase by 2 milliunits.

## 2015-04-10 NOTE — Progress Notes (Signed)
Patient comfortable w epidural  Blood pressure 106/88, pulse 80, temperature 98.5 F (36.9 C), temperature source Oral, resp. rate 20, height 5' (1.524 m), weight 78.472 kg (173 lb), last menstrual period 07/02/2014, SpO2 95 %.   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   FSE was placed prior to my shift.  Given hep B carrier status, would avoid operative vaginal delivery.  Cesarean delivery for usual indications FSR/vtx/gbs pos on PCN

## 2015-04-10 NOTE — Anesthesia Procedure Notes (Signed)
Epidural Patient location during procedure: OB Start time: 04/10/2015 2:50 AM End time: 04/10/2015 2:54 AM  Staffing Anesthesiologist: Leilani Able  Preanesthetic Checklist Completed: patient identified, surgical consent, pre-op evaluation, timeout performed, IV checked, risks and benefits discussed and monitors and equipment checked  Epidural Patient position: sitting Prep: site prepped and draped and DuraPrep Patient monitoring: continuous pulse ox and blood pressure Approach: midline Location: L3-L4 Injection technique: LOR air  Needle:  Needle type: Tuohy  Needle gauge: 17 G Needle length: 9 cm and 9 Needle insertion depth: 5 cm cm Catheter type: closed end flexible Catheter size: 19 Gauge Catheter at skin depth: 10 cm Test dose: negative and Other  Assessment Sensory level: T9 Events: blood not aspirated, injection not painful, no injection resistance, negative IV test and no paresthesia  Additional Notes Reason for block:procedure for pain

## 2015-04-10 NOTE — Anesthesia Preprocedure Evaluation (Signed)

## 2015-04-11 LAB — CBC
HEMATOCRIT: 34 % — AB (ref 36.0–46.0)
Hemoglobin: 10.9 g/dL — ABNORMAL LOW (ref 12.0–15.0)
MCH: 23.2 pg — ABNORMAL LOW (ref 26.0–34.0)
MCHC: 32.1 g/dL (ref 30.0–36.0)
MCV: 72.5 fL — ABNORMAL LOW (ref 78.0–100.0)
PLATELETS: 221 10*3/uL (ref 150–400)
RBC: 4.69 MIL/uL (ref 3.87–5.11)
RDW: 15.7 % — AB (ref 11.5–15.5)
WBC: 18.2 10*3/uL — AB (ref 4.0–10.5)

## 2015-04-11 LAB — COMPREHENSIVE METABOLIC PANEL
ALT: 17 U/L (ref 14–54)
AST: 27 U/L (ref 15–41)
Albumin: 2.4 g/dL — ABNORMAL LOW (ref 3.5–5.0)
Alkaline Phosphatase: 126 U/L (ref 38–126)
Anion gap: 6 (ref 5–15)
BUN: 9 mg/dL (ref 6–20)
CO2: 24 mmol/L (ref 22–32)
Calcium: 8.8 mg/dL — ABNORMAL LOW (ref 8.9–10.3)
Chloride: 109 mmol/L (ref 101–111)
Creatinine, Ser: 0.81 mg/dL (ref 0.44–1.00)
GFR calc Af Amer: >60 mL/min (ref >60)
GFR calc non Af Amer: >60 mL/min (ref >60)
Glucose, Bld: 93 mg/dL (ref 65–99)
Potassium: 4 mmol/L (ref 3.5–5.1)
Sodium: 139 mmol/L (ref 135–145)
Total Bilirubin: 0.3 mg/dL (ref 0.3–1.2)
Total Protein: 5.3 g/dL — ABNORMAL LOW (ref 6.5–8.1)

## 2015-04-11 MED ORDER — OXYCODONE-ACETAMINOPHEN 5-325 MG PO TABS: 1.0000 | ORAL_TABLET | ORAL | Status: DC | PRN

## 2015-04-11 NOTE — Discharge Summary (Signed)
Obstetric Discharge Summary Reason for Admission: rupture of membranes Prenatal Procedures: ultrasound Intrapartum Procedures: spontaneous vaginal delivery Postpartum Procedures: none Complications-Operative and Postpartum: Hep B carrier HEMOGLOBIN  Date Value Ref Range Status  04/11/2015 10.9* 12.0 - 15.0 g/dL Final  16/04/9603 54.0 12.2 - 16.2 g/dL Final  98/05/9146 82.9 g/dL    HCT  Date Value Ref Range Status  04/11/2015 34.0* 36.0 - 46.0 % Final  01/11/2012 40 %    HCT, POC  Date Value Ref Range Status  05/10/2014 42.7 37.7 - 47.9 % Final    Physical Exam:  General: alert Lochia: appropriate Uterine Fundus: firm   Discharge Diagnoses: Term Pregnancy-delivered and Hep B carrier  Discharge Information: Date: 04/11/2015 Activity: pelvic rest Diet: routine Medications: PNV, Ibuprofen and Percocet Condition: stable Instructions: refer to practice specific booklet Discharge to: home Follow-up Information    Follow up with [facility] GEFFEL Chestine Spore, MD. Schedule an appointment as soon as possible for a visit in 1 month.   Specialty:  Obstetrics   Contact information:   [address] [place] [address] [phone]       Newborn Data: Live born female  Birth Weight: 7 lb 4.9 oz (3314 g) APGAR: 8, 9  Home with mother.  ANDERSON,MARK E 04/11/2015, 8:53 AM

## 2015-04-11 NOTE — Progress Notes (Signed)
PPD#1 Pt is doing well. Lochia-mild. Would like to go home. VSSAF IMP/ Stable Plan/Will discharge to home

## 2015-04-11 NOTE — Progress Notes (Signed)
UR chart review completed.  

## 2015-04-11 NOTE — Anesthesia Postprocedure Evaluation (Signed)
  Anesthesia Post-op Note  Patient: Dana [name]  Procedure(s) Performed: * No procedures listed *  Patient Location: PACU and Mother/Baby  Anesthesia Type:Epidural  Level of Consciousness: awake, alert  and oriented  Airway and Oxygen Therapy: Patient Spontanous Breathing  Post-op Pain: none  Post-op Assessment: Post-op Vital signs reviewed, Patient's Cardiovascular Status Stable, No headache, No backache, No residual numbness and No residual motor weakness  Post-op Vital Signs: Reviewed and stable  Complications: No apparent anesthesia complications

## 2015-05-17 ENCOUNTER — Other Ambulatory Visit: Payer: Self-pay | Admitting: Obstetrics

## 2015-05-17 ENCOUNTER — Ambulatory Visit
Admission: RE | Admit: 2015-05-17 | Discharge: 2015-05-17 | Disposition: A | Discharge status: Home / Self Care | Payer: Medicaid Other | Source: Ambulatory Visit | Attending: Obstetrics | Admitting: Obstetrics

## 2015-05-17 DIAGNOSIS — N611 Abscess of the breast and nipple: Secondary | ICD-10-CM

## 2015-06-18 ENCOUNTER — Encounter (HOSPITAL_COMMUNITY): Payer: Self-pay | Admitting: *Deleted

## 2015-06-18 ENCOUNTER — Inpatient Hospital Stay (HOSPITAL_COMMUNITY)
Admission: AD | Admit: 2015-06-18 | Discharge: 2015-06-18 | Disposition: A | Discharge status: Home / Self Care | Payer: Self-pay | Source: Ambulatory Visit | Attending: Obstetrics and Gynecology | Admitting: Obstetrics and Gynecology

## 2015-06-18 DIAGNOSIS — O9279 Other disorders of lactation: Secondary | ICD-10-CM

## 2015-06-18 DIAGNOSIS — O9122 Nonpurulent mastitis associated with the puerperium: Secondary | ICD-10-CM | POA: Insufficient documentation

## 2015-06-18 LAB — URINALYSIS, ROUTINE W REFLEX MICROSCOPIC
Bilirubin Urine: NEGATIVE
Glucose, UA: NEGATIVE mg/dL
Hgb urine dipstick: NEGATIVE
KETONES UR: NEGATIVE mg/dL
LEUKOCYTES UA: NEGATIVE
NITRITE: NEGATIVE
PH: 6 (ref 5.0–8.0)
Protein, ur: NEGATIVE mg/dL

## 2015-06-18 LAB — CBC WITH DIFFERENTIAL/PLATELET
BASOS PCT: 0 %
Basophils Absolute: 0 10*3/uL (ref 0.0–0.1)
Eosinophils Absolute: 0.2 10*3/uL (ref 0.0–0.7)
Eosinophils Relative: 3 %
HEMATOCRIT: 36.2 % (ref 36.0–46.0)
HEMOGLOBIN: 11.7 g/dL — AB (ref 12.0–15.0)
LYMPHS ABS: 1.6 10*3/uL (ref 0.7–4.0)
Lymphocytes Relative: 23 %
MCH: 23.8 pg — ABNORMAL LOW (ref 26.0–34.0)
MCHC: 32.3 g/dL (ref 30.0–36.0)
MCV: 73.7 fL — ABNORMAL LOW (ref 78.0–100.0)
MONOS PCT: 4 %
Monocytes Absolute: 0.3 10*3/uL (ref 0.1–1.0)
NEUTROS ABS: 4.9 10*3/uL (ref 1.7–7.7)
NEUTROS PCT: 70 %
Platelets: 261 10*3/uL (ref 150–400)
RBC: 4.91 MIL/uL (ref 3.87–5.11)
RDW: 18.6 % — ABNORMAL HIGH (ref 11.5–15.5)
WBC: 7.1 10*3/uL (ref 4.0–10.5)

## 2015-06-18 MED ORDER — IBUPROFEN 600 MG PO TABS: 600.0000 mg | ORAL_TABLET | Freq: Four times a day (QID) | ORAL | Status: DC | PRN

## 2015-06-18 NOTE — MAU Provider Note (Signed)
Chief Complaint: Breast Pain  First Provider Initiated Contact with Patient 06/18/15 1334      SUBJECTIVE HPI: Dana [name] is a 29 y.o. [id number] at 2+ months postpartum who presents to [facility] reporting left breast pain and enlarging mass. Was diagnosed with mastitis 06/14/2015. Had fever, breast pain, redness and mass at that time. Was started on Bactrim and states she has been taking it as directed. Symptoms improved until today. No further fever or chills. Has been breastfeeding and pumping both breasts, but she often has to go 11-12 hours w/out pumping of nursing due to work. States there is nowhere for her to pump and no one can bring her baby to work. Also reports dysuria.   Location: left outer breast Quality: sore, throbbing Severity: 7/10 on pain scale Duration: 6 days Timing: constant Course: Improving initially, now worsening. Occasionally takes Ibuprofen, but ran out.  Modifying factors: Improves w/ ibuprofen.  Associated signs and symptoms: Negative for fever, chills.   Past Medical History  Diagnosis Date  . Positive PPD   . 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 Abortion history    OB History  Gravida Para Term Preterm AB SAB TAB Ectopic Multiple Living  4 2 1 1 2 1 1  0 0 2    # Outcome Date GA Lbr Len/2nd Weight Sex Delivery Anes PTL Lv  4 Term 04/10/15 1010w2d 15:37 / 00:09 7 lb 4.9 oz (3.314 kg) M Vag-Spont EPI  Y  3 Preterm 02/12/12 2211w4d 10:57 / 00:22 3 lb 12.3 oz (1.71 kg) M Vag-Spont EPI  Y  2 SAB           1 TAB              Past Surgical History  Procedure Laterality Date  . No past surgeries    . Dilation and evacuation N/A 04/14/2013    Procedure: DILATATION AND EVACUATION;  Surgeon: Loney LaurenceMichelle A Wynette Jersey, MD;  Location: [facility];  Service: Gynecology;  Laterality: N/A;   Social History   Social History  . Marital Status: Married    Spouse Name: N/A  . Number of Children: N/A  . Years of Education: N/A   Occupational History  . Not on file.    Social History Main Topics  . Smoking status: Never Smoker   . Smokeless tobacco: Never Used  . Alcohol Use: No  . Drug Use: No  . Sexual Activity: Yes    Birth Control/ Protection: Implant   Other Topics Concern  . Not on file   Social History Narrative   Marital status: married x 6 years      Children 1 son (2yo)      Lives: with husband, son, mom, dad.      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   No current outpatient prescriptions on file prior to encounter.   No Known Allergies  I have reviewed the past Medical Hx, Surgical Hx, Social Hx, Allergies and Medications.   Review of Systems  Constitutional: Negative for fever and chills.   breast: Positive for mass, tenderness. Negative for erythema, swelling, nipple pain. Able to pump milk from affected breast.  OBJECTIVE Patient Vitals for the past 24 hrs:  BP Temp Temp src Pulse Resp  06/18/15 1238 112/71 mmHg 97.6 F (  36.4 C) Oral 83 18   Constitutional: Well-developed, well-nourished female in no acute distress.  Cardiovascular: normal rate Respiratory: normal rate and effort.  Breast: Left breast with tender 5 cm, nonfluctuant mass. No erythema, warmth or edema. Able to express milk from affected area of breast. Neurologic: Alert and oriented x 4.   LAB RESULTS Results for orders placed or performed during the hospital encounter of 06/18/15 (from the past 24 hour(s))  Urinalysis, Routine w reflex microscopic (not at [facility])     Status: Abnormal   Collection Time: 06/18/15 12:45 PM  Result Value Ref Range   Color, Urine YELLOW YELLOW   APPearance CLEAR CLEAR   Specific Gravity, Urine >1.030 (H) 1.005 - 1.030   pH 6.0 5.0 - 8.0   Glucose, UA NEGATIVE NEGATIVE mg/dL   Hgb urine dipstick NEGATIVE NEGATIVE   Bilirubin Urine NEGATIVE NEGATIVE   Ketones, ur NEGATIVE NEGATIVE mg/dL    Protein, ur NEGATIVE NEGATIVE mg/dL   Nitrite NEGATIVE NEGATIVE   Leukocytes, UA NEGATIVE NEGATIVE  CBC with Differential/Platelet     Status: Abnormal   Collection Time: 06/18/15  1:15 PM  Result Value Ref Range   WBC 7.1 4.0 - 10.5 K/uL   RBC 4.91 3.87 - 5.11 MIL/uL   Hemoglobin 11.7 (L) 12.0 - 15.0 g/dL   HCT 29.5 62.1 - 30.8 %   MCV 73.7 (L) 78.0 - 100.0 fL   MCH 23.8 (L) 26.0 - 34.0 pg   MCHC 32.3 30.0 - 36.0 g/dL   RDW 65.7 (H) 84.6 - 96.2 %   Platelets 261 150 - 400 K/uL   Neutrophils Relative % 70 %   Neutro Abs 4.9 1.7 - 7.7 K/uL   Lymphocytes Relative 23 %   Lymphs Abs 1.6 0.7 - 4.0 K/uL   Monocytes Relative 4 %   Monocytes Absolute 0.3 0.1 - 1.0 K/uL   Eosinophils Relative 3 %   Eosinophils Absolute 0.2 0.0 - 0.7 K/uL   Basophils Relative 0 %   Basophils Absolute 0.0 0.0 - 0.1 K/uL    IMAGING No results found.  MAU COURSE CBC, breast exam.  Discussed symptoms, exam and normal white blood cell count with Dr. Henderson Cloud. Agrees with plan of care. Patient will need imaging if symptoms do not improve or worsen.  MDM 29 year old breast-feeding female with diagnosis of mastitis was initially improving, but is now having worsening pain and enlarging breast mass consistent with clogged duct. Symptoms of infection have improved while on Bactrim. Low suspicion for abscess due to nonfluctuant mass and absence of fever or leukocytosis.  ASSESSMENT 1. Clogged duct, postpartum     PLAN Discharge home in stable condition per consult with M.D. Mastitis/abscess Precautions Instructed patient to apply warm compress 15 minutes before feeding or pumping and massage clogged duct while feeding or pumping to help drain that area.  Ibuprofen 600 mg by mouth every 6 on schedule 48-72 hours. Strongly encourage patient to pump or nurse every 3-4 hours until symptoms have resolved.  Continue Bactrim.     Follow-up Information    Follow up with [facility].   Why:  As needed  if symptoms worsen   Contact information:   [address] [place] [address] [phone]       Follow up with [facility].   Specialty:  Lactation   Why:  if no improvement in clogged milk ducts or for other breast feeding concerns   Contact information:  [address] [id number] mc [place] [address] [phone]      Follow up with [facility] [place].   Why:  for fever higher than 100.4, or worsening pain or redness   Contact information:   [address] [id number] mc [place] [address] [phone]       Medication List    TAKE these medications        acetaminophen 500 MG tablet  Commonly known as:  TYLENOL  Take 1,000 mg by mouth every 6 (six) hours as needed for mild pain.     ibuprofen 600 MG tablet  Commonly known as:  ADVIL,MOTRIN  Take 1 tablet (600 mg total) by mouth every 6 (six) hours as needed for mild pain or moderate pain.     sulfamethoxazole-trimethoprim 800-160 MG tablet  Commonly known as:  BACTRIM DS,SEPTRA DS  Take 1 tablet by mouth 2 (two) times daily.       [place], CNM 06/18/2015  2:50 PM

## 2015-06-18 NOTE — MAU Note (Addendum)
Pt states the side of her L breast is hurting, had fever earlier this morning - "100 something."  Took tylenol, is having chills, feeling nauseated.  Vaginal delivery on 9/28, is breastfeeding on a regular basis.  [facility], started on Bactrim.  Was advised by MD office to come to MAU today.   Pt also states she has pain with urination.

## 2015-06-18 NOTE — Discharge Instructions (Signed)
Breastfeeding Challenges and Solutions Even though breastfeeding is natural, it can be challenging, especially in the first few weeks after childbirth. It is normal for problems to arise when starting to breastfeed your new baby, even if you have breastfed before. This document provides some solutions to the most common breastfeeding challenges.  CHALLENGES AND SOLUTIONS Challenge--Cracked or Sore Nipples Cracked or sore nipples are commonly experienced by breastfeeding mothers. Cracked or sore nipples often are caused by inadequate latching (when your baby's mouth attaches to your breast to breastfeed). Soreness can also happen if your baby is not positioned properly at your breast. Although nipple cracking and soreness are common during the first week after birth, nipple pain is never normal. If you experience nipple cracking or soreness that lasts longer than 1 week or nipple pain, call your health care provider or lactation consultant.  Solution Ensure proper latching and positioning of your baby by following the steps below:  Find a comfortable place to sit or lie down, with your neck and back well supported.  Place a pillow or rolled up blanket under your baby to bring him or her to the level of your breast (if you are seated).  Make sure that your baby's abdomen is facing your abdomen.  Gently massage your breast. With your fingertips, massage from your chest wall toward your nipple in a circular motion. This encourages milk flow. You may need to continue this action during the feeding if your milk flows slowly.  Support your breast with 4 fingers underneath and your thumb above your nipple. Make sure your fingers are well away from your nipple and your baby's mouth.  Stroke your baby's lips gently with your finger or nipple.  When your baby's mouth is open wide enough, quickly bring your baby to your breast, placing your entire nipple and as much of the colored area around your nipple  (areola) as possible into your baby's mouth.  More areola should be visible above your baby's upper lip than below the lower lip.  Your baby's tongue should be between his or her lower gum and your breast.  Ensure that your baby's mouth is correctly positioned around your nipple (latched). Your baby's lips should create a seal on your breast and be turned out (everted).  It is common for your baby to suck for about 2-3 minutes in order to start the flow of breast milk. Signs that your baby has successfully latched on to your nipple include:   Quietly tugging or quietly sucking without causing you pain.   Swallowing heard between every 3-4 sucks.   Muscle movement above and in front of his or her ears with sucking.  Signs that your baby has not successfully latched on to nipple include:   Sucking sounds or smacking sounds from your baby while nursing.   Nipple pain.  Ensure that your breasts stay moisturized and healthy by:  Avoiding the use of soap on your nipples.   Wearing a supportive bra. Avoid wearing underwire-style bras or tight bras.   Air drying your nipples for 3-4 minutes after each feeding.   Using only cotton bra pads to absorb breast milk leakage. Leaking of breast milk between feedings is normal. Be sure to change the pads if they become soaked with milk.  Using lanolin on your nipples after nursing. Lanolin helps to maintain your skin's normal moisture barrier. If you use pure lanolin you do not need to wash it off before feeding your baby again. Pure lanolin   is not toxic to your baby. You may also hand express a few drops of breast milk and gently massage that milk into your nipples, allowing it to air dry. Challenge--Breast Engorgement Breast engorgement is the overfilling of your breasts with breast milk. In the first few weeks after giving birth, you may experience breast engorgement. Breast engorgement can make your breasts throb and feel hard, tightly  stretched, warm, and tender. Engorgement peaks about the fifth day after you give birth. Having breast engorgement does not mean you have to stop breastfeeding your baby. Solution  Breastfeed when you feel the need to reduce the fullness of your breasts or when your baby shows signs of hunger. This is called "breastfeeding on demand."  Newborns (babies younger than 4 weeks) often breastfeed every 1-3 hours during the day. You may need to awaken your baby to feed if he or she is asleep at a feeding time.  Do not allow your baby to sleep longer than 5 hours during the night without a feeding.  Pump or hand express breast milk before breastfeeding to soften your breast, areola, and nipple.  Apply warm, moist heat (in the shower or with warm water-soaked hand towels) just before feeding or pumping, or massage your breast before or during breastfeeding. This increases circulation and helps your milk to flow.  Completely empty your breasts when breastfeeding or pumping. Afterward, wear a snug bra (nursing or regular) or tank top for 1-2 days to signal your body to slightly decrease milk production. Only wear snug bras or tank tops to treat engorgement. Tight bras typically should be avoided by breastfeeding mothers. Once engorgement is relieved, return to wearing regular, loose-fitting clothes.  Apply ice packs to your breasts to lessen the pain from engorgement and relieve swelling, unless the ice is uncomfortable for you.  Do not delay feedings. Try to relax when it is time to feed your baby. This helps to trigger your "let-down reflex," which releases milk from your breast.  Ensure your baby is latched on to your breast and positioned properly while breastfeeding.  Allow your baby to remain at your breast as long as he or she is latched on well and actively sucking. Your baby will let you know when he or she is done breastfeeding by pulling away from your breast or falling asleep.  Avoid  introducing bottles or pacifiers to your baby in the early weeks of breastfeeding. Wait to introduce these things until after resolving any breastfeeding challenges.  Try to pump your milk on the same schedule as when your baby would breastfeed if you are returning to work or away from home for an extended period.  Drink plenty of fluids to avoid dehydration, which can eventually put you at greater risk of breast engorgement. If you follow these suggestions, your engorgement should improve in 24-48 hours. If you are still experiencing difficulty, call your lactation consultant or health care provider.  Challenge--Plugged Milk Ducts Plugged milk ducts occur when the duct does not drain milk effectively and becomes swollen. Wearing a tight-fitting nursing bra or having difficulty with latching may cause plugged milk ducts. Not drinking enough water (8-10 c [1.9-2.4 L] per day) can contribute to plugged milk ducts. Once a duct has become plugged, hard lumps, soreness, and redness may develop in your breast.  Solution Do not delay feedings. Feed your baby frequently and try to empty your breasts of milk at each feeding. Try breastfeeding from the affected side first so there is a   better chance that the milk will drain completely from that breast. Apply warm, moist towels to your breasts for 5-10 minutes before feeding. Alternatively, a hot shower right before breastfeeding can provide the moist heat that can encourage milk flow. Gentle massage of the sore area before and during a feeding may also help. Avoid wearing tight clothing or bras that put pressure on your breasts. Wear bras that offer good support to your breasts, but avoid underwire bras. If you have a plugged milk duct and develop a fever, you need to see your health care provider.  Challenge--Mastitis Mastitis is inflammation of your breast. It usually is caused by a bacterial infection and can cause flu-like symptoms. You may develop redness in  your breast and a fever. Often when mastitis occurs, your breast becomes firm, warm, and very painful. The most common causes of mastitis are poor latching, ineffective sucking from your baby, consistent pressure on your breast (possibly from wearing a tight-fitting bra or shirt that restricts the milk flow), unusual stress or fatigue, or missed feedings.  Solution You will be given antibiotic medicine to treat the infection. It is still important to breastfeed frequently to empty your breasts. Continuing to breastfeed while you recover from mastitis will not harm your baby. Make sure your baby is positioned properly during every feeding. Apply moist heat to your breasts for a few minutes before feeding to help the milk flow and to help your breasts empty more easily. Challenge--Thrush Thrush is a yeast infection that can form on your nipples, in your breast, or in your baby's mouth. It causes itching, soreness, burning or stabbing pain, and sometimes a rash.  Solution You will be given a medicated ointment for your nipples, and your baby will be given a liquid medicine for his or her mouth. It is important that you and your baby are treated at the same time because thrush can be passed between you and your baby. Change disposable nursing pads often. Any bras, towels, or clothing that come in contact with infected areas of your body or your baby's body need to be washed in very hot water every day. Wash your hands and your baby's hands often. All pacifiers, bottle nipples, or toys your baby puts in his or her mouth should be boiled once a day for 20 minutes. After 1 week of treatment, discard pacifiers and bottle nipples and buy new ones. All breast pump parts that touch the milk need to be boiled for 20 minutes every day. Challenge--Low Milk Supply You may not be producing enough milk if your baby is not gaining the proper amount of weight. Breast milk production is based on a supply-and-demand system. Your  milk supply depends on how frequently and effectively your baby empties your breast. Solution The more you breastfeed and pump, the more breast milk you will produce. It is important that your baby empties at least one of your breasts at each feeding. If this is not happening, then use a breast pump or hand express any milk that remains. This will help to drain as much milk as possible at each feeding. It will also signal your body to produce more milk. If your baby is not emptying your breasts, it may be due to latching, sucking, or positioning problems. If low milk supply continues after addressing these issues, contact your health care provider or a lactation specialist as soon as possible. Challenge--Inverted or Flat Nipples Some women have nipples that turn inward instead of protruding outward.   Other women have nipples that are flat. Inverted or flat nipples can sometimes make it more difficult for your baby to latch onto your breast. Solution You may be given a small device that pulls out inverted nipples. This device should be applied right before your baby is brought to your breast. You can also try using a breast pump for a short time before placing the baby at your breast. The pump can pull your nipple outwards to help your infant latch more easily. The baby's sucking motion will help the inverted nipple protrude as well.  If you have flat nipples, encourage your baby to latch onto your breast and feed frequently in the early days after birth. This will give your baby practice latching on correctly while your breast is still soft. When your milk supply increases, between the second and fifth day after birth and your breasts become full, your baby will have an easier time latching.  Contact a lactation consultant if you still have concerns. She or he can teach you additional techniques to address breastfeeding problems related to nipple shape and position.  FOR MORE INFORMATION [employer]: www.llli.org   This information is not intended to replace advice given to you by your health care provider. Make sure you discuss any questions you have with your health care provider.   Document Released: 12/21/2005 Document Revised: 07/20/2014 Document Reviewed: 12/23/2012 Elsevier Interactive Patient Education 2016 [employer]   

## 2015-12-11 ENCOUNTER — Ambulatory Visit (INDEPENDENT_AMBULATORY_CARE_PROVIDER_SITE_OTHER): Payer: BLUE CROSS/BLUE SHIELD | Admitting: Family Medicine

## 2015-12-11 ENCOUNTER — Telehealth: Payer: Self-pay

## 2015-12-11 VITALS — BP 110/68 | HR 76 | Temp 97.8°F | Resp 18 | Ht 60.0 in | Wt 132.0 lb

## 2015-12-11 DIAGNOSIS — R509 Fever, unspecified: Secondary | ICD-10-CM | POA: Diagnosis not present

## 2015-12-11 DIAGNOSIS — R309 Painful micturition, unspecified: Secondary | ICD-10-CM | POA: Diagnosis not present

## 2015-12-11 DIAGNOSIS — R202 Paresthesia of skin: Secondary | ICD-10-CM | POA: Diagnosis not present

## 2015-12-11 DIAGNOSIS — M62838 Other muscle spasm: Secondary | ICD-10-CM | POA: Diagnosis not present

## 2015-12-11 DIAGNOSIS — R200 Anesthesia of skin: Secondary | ICD-10-CM

## 2015-12-11 LAB — POCT URINALYSIS DIP (MANUAL ENTRY)
Bilirubin, UA: NEGATIVE
Glucose, UA: NEGATIVE
Ketones, POC UA: NEGATIVE
Leukocytes, UA: NEGATIVE
Nitrite, UA: NEGATIVE
PROTEIN UA: NEGATIVE
SPEC GRAV UA: 1.02
UROBILINOGEN UA: 0.2
pH, UA: 5.5

## 2015-12-11 LAB — POC MICROSCOPIC URINALYSIS (UMFC): Mucus: ABSENT

## 2015-12-11 LAB — POCT CBC
GRANULOCYTE PERCENT: 65.8 % (ref 37–80)
HCT, POC: 40 % (ref 37.7–47.9)
Hemoglobin: 13.8 g/dL (ref 12.2–16.2)
Lymph, poc: 1.8 (ref 0.6–3.4)
MCH: 26.5 pg — AB (ref 27–31.2)
MCHC: 34.5 g/dL (ref 31.8–35.4)
MCV: 76.7 fL — AB (ref 80–97)
MID (cbc): 0.1 (ref 0–0.9)
MPV: 7.3 fL (ref 0–99.8)
PLATELET COUNT, POC: 248 10*3/uL (ref 142–424)
POC Granulocyte: 3.8 (ref 2–6.9)
POC LYMPH PERCENT: 31.7 %L (ref 10–50)
POC MID %: 2.5 %M (ref 0–12)
RBC: 5.22 M/uL (ref 4.04–5.48)
RDW, POC: 14.1 %
WBC: 5.7 10*3/uL (ref 4.6–10.2)

## 2015-12-11 MED ORDER — CEPHALEXIN 500 MG PO CAPS: 500.0000 mg | ORAL_CAPSULE | Freq: Three times a day (TID) | ORAL | Status: DC

## 2015-12-11 MED ORDER — DICLOFENAC SODIUM 1 % TD GEL: 2.0000 g | Freq: Four times a day (QID) | TRANSDERMAL | Status: DC

## 2015-12-11 NOTE — Telephone Encounter (Signed)
I dont recall discussing but voltaren gel would bbe a good coice for shoulder pain.   Rx Sent, will ask nursing to update.   Murtis SinkSam Deshia Vanderhoof, MD [facility] 12/11/2015, 5:02 PM

## 2015-12-11 NOTE — Patient Instructions (Addendum)
Great to meet you!  I am treating you for a UTI, the labs are not very clear that this is the problem so I have sent an additional lab to clear it up for us.   I believe you are having a muscle spasm in your R shoulder, try heat and massage, I expect this will get better within 1 week   IF you received an x-ray today, you will receive an invoice from [facility]. Please contact [facility] at [phone] with questions or concerns regarding your invoice.   IF you received labwork today, you will receive an invoice from [facility]. Please contact [facility] at [phone] with questions or concerns regarding your invoice.   Our billing staff will not be able to assist you with questions regarding bills from these companies.  You will be contacted with the lab results as soon as they are available. The fastest way to get your results is to activate your My Chart account. Instructions are located on the last page of this paperwork. If you have not heard from us regarding the results in 2 weeks, please contact this office.    Urinary Tract Infection Urinary tract infections (UTIs) can develop anywhere along your urinary tract. Your urinary tract is your body's drainage system for removing wastes and extra water. Your urinary tract includes two kidneys, two ureters, a bladder, and a urethra. Your kidneys are a pair of bean-shaped organs. Each kidney is about the size of your fist. They are located below your ribs, one on each side of your spine. CAUSES Infections are caused by microbes, which are microscopic organisms, including fungi, viruses, and bacteria. These organisms are so small that they can only be seen through a microscope. Bacteria are the microbes that most commonly cause UTIs. SYMPTOMS  Symptoms of UTIs may vary by age and gender of the patient and by the location of the infection. Symptoms in young women typically include a frequent and  intense urge to urinate and a painful, burning feeling in the bladder or urethra during urination. Older women and men are more likely to be tired, shaky, and weak and have muscle aches and abdominal pain. A fever may mean the infection is in your kidneys. Other symptoms of a kidney infection include pain in your back or sides below the ribs, nausea, and vomiting. DIAGNOSIS To diagnose a UTI, your caregiver will ask you about your symptoms. Your caregiver will also ask you to provide a urine sample. The urine sample will be tested for bacteria and white blood cells. White blood cells are made by your body to help fight infection. TREATMENT  Typically, UTIs can be treated with medication. Because most UTIs are caused by a bacterial infection, they usually can be treated with the use of antibiotics. The choice of antibiotic and length of treatment depend on your symptoms and the type of bacteria causing your infection. HOME CARE INSTRUCTIONS  If you were prescribed antibiotics, take them exactly as your caregiver instructs you. Finish the medication even if you feel better after you have only taken some of the medication.  Drink enough water and fluids to keep your urine clear or pale yellow.  Avoid caffeine, tea, and carbonated beverages. They tend to irritate your bladder.  Empty your bladder often. Avoid holding urine for long periods of time.  Empty your bladder before and after sexual intercourse.  After a bowel movement, women should cleanse from front to back. Use each tissue only once.  SEEK MEDICAL CARE IF:   You have back pain.  You develop a fever.  Your symptoms do not begin to resolve within 3 days. SEEK IMMEDIATE MEDICAL CARE IF:   You have severe back pain or lower abdominal pain.  You develop chills.  You have nausea or vomiting.  You have continued burning or discomfort with urination. MAKE SURE YOU:   Understand these instructions.  Will watch your  condition.  Will get help right away if you are not doing well or get worse.   This information is not intended to replace advice given to you by your health care provider. Make sure you discuss any questions you have with your health care provider.   Document Released: 04/08/2005 Document Revised: 03/20/2015 Document Reviewed: 08/07/2011 Elsevier Interactive Patient Education [employer]

## 2015-12-11 NOTE — Progress Notes (Signed)
   HPI  Patient presents today here with several complaints  Since delivery 8 months ago she has had persistent L leg numbness and tingling, it is annoying and does not limit her activity  She states that over the last 2 weeks she has had R sided neck pain, it is worst in a pinpoint area above her shoulder and radiating down her R arm on the extensor side across her hand diffusely.   Dysuria States that over the last 3 days she has had dysuria, no frequency She had a fever measured orally 3 days ago that measured 101  PMH: Smoking status noted ROS: Per HPI  Objective: BP 110/68 mmHg  Pulse 76  Temp(Src) 97.8 F (36.6 C) (Oral)  Resp 18  Ht 5' (1.524 m)  Wt 132 lb (59.875 kg)  BMI 25.78 kg/m2  SpO2 98% Gen: NAD, alert, cooperative with exam HEENT: NCAT, EOMI, PERRL, TMs WNL. Oral mucosa moist Neck: full ROM CV: RRR, good S1/S2, no murmur Resp: CTABL, no wheezes, non-labored Abd: soft, mild tenderness to palp of LUQ and suprapubic area Ext: No edema, warm Neuro: Alert and oriented, strength 5/5 and sensation intact ion BL upper and lower extremities  MSK:  No tenderness to palpation of paraspinal muscles in cervical or lumbar area, no boiny tenderness of either area.  POint tenderness in trapezius on R midway between neck and shoulder  Assessment and plan:  # Dysuria Considering fever and abd pain I am covering for UTI, Culture  # Muscle spasm R shoulder Discussed caution with meds with breast feeding Recommend heat and massage No signs of meningismus, neck ROM is easy and full, well appearing RTC  If not improving with 1 week  # L leg numbness 8 months, continued Strength and sensation intact on exam With her affect I am more concerned with mild depression which is leading to these symptoms.  Consider starting medication, but exercise caution, pt will consider and f/u in 1 month    Orders Placed This Encounter  Procedures  . Urine culture  . POCT urinalysis  dipstick  . POCT Microscopic Urinalysis (UMFC)  . POCT CBC    Meds ordered this encounter  Medications  . cephALEXin (KEFLEX) 500 MG capsule    Sig: Take 1 capsule (500 mg total) by mouth 3 (three) times daily.    Dispense:  21 capsule    Refill:  0    Dana FentonSamuel Jonessa Triplett, MD 12:30 PM

## 2015-12-11 NOTE — Telephone Encounter (Signed)
Pt was seen today and thought that she should have gotten a cream but did not   Best number [phone]

## 2015-12-12 NOTE — Telephone Encounter (Signed)
Left message for pt to call back  °

## 2015-12-13 LAB — URINE CULTURE
Colony Count: NO GROWTH
ORGANISM ID, BACTERIA: NO GROWTH

## 2015-12-26 ENCOUNTER — Telehealth: Payer: Self-pay | Admitting: Family Medicine

## 2015-12-26 NOTE — Telephone Encounter (Signed)
Prior authorization received for voltaren gel.   Would recommend aspercreme as an alternative.   Murtis SinkSam Emberleigh Reily, MD [facility] 12/26/2015, 7:07 PM

## 2015-12-27 NOTE — Telephone Encounter (Signed)
Notified pt that Dr recommends aspercreme. Voltaren gel will not be approved for shoulder pain. Pt agreed to try it.

## 2016-04-06 ENCOUNTER — Ambulatory Visit (HOSPITAL_COMMUNITY)
Admission: EM | Admit: 2016-04-06 | Discharge: 2016-04-06 | Disposition: A | Discharge status: Home / Self Care | Payer: BLUE CROSS/BLUE SHIELD | Attending: Family Medicine | Admitting: Family Medicine

## 2016-04-06 ENCOUNTER — Encounter (HOSPITAL_COMMUNITY): Payer: Self-pay | Admitting: Emergency Medicine

## 2016-04-06 DIAGNOSIS — N6011 Diffuse cystic mastopathy of right breast: Secondary | ICD-10-CM | POA: Diagnosis not present

## 2016-04-06 MED ORDER — CEPHALEXIN 500 MG PO CAPS: 500.0000 mg | ORAL_CAPSULE | Freq: Four times a day (QID) | ORAL | 0 refills | Status: DC

## 2016-04-06 NOTE — ED Triage Notes (Signed)
Pt has been suffering from recurrent right breast pain laterally.  She states she has been treated several times with antibiotics for a "clogged duct".  Pt reports an unmeasured fever on Saturday and all generalized body aches since Saturday.  She first noticed a knot on her right breast then started experiencing the body aches and fever.

## 2016-04-06 NOTE — ED Provider Notes (Signed)
[facility]    CSN: [id number] Arrival date & time: 04/06/16  1048  First Provider Contact:  First MD Initiated Contact with Patient 04/06/16 1257        History   Chief Complaint Chief Complaint  Patient presents with  . Breast Pain    right  . Fever    HPI Dana [name] is a 30 y.o. female.   The history is provided by the patient.  Fever  Temp source:  Subjective Severity:  Moderate Onset quality:  Gradual Duration:  2 days Progression:  Worsening Chronicity:  Recurrent Relieved by:  None tried Worsened by:  Nothing Ineffective treatments:  None tried Associated symptoms: congestion and rhinorrhea   Associated symptoms comment:  Right breast pain with h/o clogged duct, continues to breast feed.   Past Medical History:  Diagnosis Date  . Abortion history   . 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 Positive PPD     Patient Active Problem List   Diagnosis Date Noted  . Indication for care in labor or delivery 04/09/2015  . Dysmenorrhea 05/05/2011  . TMJ (temporomandibular joint syndrome) 12/12/2010    Past Surgical History:  Procedure Laterality Date  . DILATION AND EVACUATION N/A 04/14/2013   Procedure: DILATATION AND EVACUATION;  Surgeon: Loney LaurenceMichelle A Horvath, MD;  Location: [facility];  Service: Gynecology;  Laterality: N/A;  . NO PAST SURGERIES      OB History    Gravida Para Term Preterm AB Living   4 2 1 1 2 2    SAB TAB Ectopic Multiple Live Births   1 1 0 0 2       Home Medications    Prior to Admission medications   Medication Sig Start Date End Date Taking? Authorizing Provider  ibuprofen (ADVIL,MOTRIN) 600 MG tablet Take 1 tablet (600 mg total) by mouth every 6 (six) hours as needed for mild pain or moderate pain. 06/18/15  Yes Dorathy KinsmanVirginia Smith, CNM  acetaminophen (TYLENOL) 500 MG tablet Take 1,000 mg by mouth every 6 (six) hours as needed for mild pain.    Historical Provider, MD  cephALEXin (KEFLEX) 500 MG capsule Take 1 capsule (500 mg  total) by mouth 3 (three) times daily. 12/11/15   Elenora GammaSamuel L Bradshaw, MD  diclofenac sodium (VOLTAREN) 1 % GEL Apply 2 g topically 4 (four) times daily. 12/11/15   Elenora GammaSamuel L Bradshaw, MD    Family History Family History  Problem Relation Age of Onset  . Hypertension Father   . Anesthesia problems Neg Hx     Social History Social History  Substance Use Topics  . Smoking status: Never Smoker  . Smokeless tobacco: Never Used  . Alcohol use No     Allergies   Review of patient's allergies indicates no known allergies.   Review of Systems Review of Systems  Constitutional: Positive for fever.  HENT: Positive for congestion and rhinorrhea.   Respiratory: Negative.   Cardiovascular: Negative.   Gastrointestinal: Negative.   Genitourinary: Negative.   Skin: Negative.   All other systems reviewed and are negative.    Physical Exam Triage Vital Signs ED Triage Vitals [04/06/16 1247]  Enc Vitals Group     BP 118/62     Pulse Rate 89     Resp 12     Temp 98.8 F (37.1 C)     Temp Source Oral     SpO2 100 %     Weight      Height  Head Circumference      Peak Flow      Pain Score 6     Pain Loc      Pain Edu?      Excl. in GC?    No data found.   Updated Vital Signs BP 118/62 (BP Location: Right Arm)   Pulse 89   Temp 98.8 F (37.1 C) (Oral)   Resp 12   SpO2 100%   Breastfeeding? Yes   Visual Acuity Right Eye Distance:   Left Eye Distance:   Bilateral Distance:    Right Eye Near:   Left Eye Near:    Bilateral Near:     Physical Exam  Constitutional: She is oriented to person, place, and time. She appears well-developed and well-nourished. No distress.  HENT:  Right Ear: External ear normal.  Left Ear: External ear normal.  Nose: Nose normal.  Mouth/Throat: Oropharynx is clear and moist.  Neck: Normal range of motion. Neck supple.  Cardiovascular: Normal rate, regular rhythm, normal heart sounds and intact distal pulses.   Pulmonary/Chest: She  exhibits tenderness. Right breast exhibits tenderness. Right breast exhibits no mass, no nipple discharge and no skin change.    Genitourinary: No breast bleeding.  Neurological: She is alert and oriented to person, place, and time.  Skin: Skin is warm and dry.  Nursing note and vitals reviewed.    UC Treatments / Results  Labs (all labs ordered are listed, but only abnormal results are displayed) Labs Reviewed - No data to display  EKG  EKG Interpretation None       Radiology No results found.  Procedures Procedures (including critical care time)  Medications Ordered in UC Medications - No data to display   Initial Impression / Assessment and Plan / UC Course  I have reviewed the triage vital signs and the nursing notes.  Pertinent labs & imaging results that were available during my care of the patient were reviewed by me and considered in my medical decision making (see chart for details).  Clinical Course      Final Clinical Impressions(s) / UC Diagnoses   Final diagnoses:  None    New Prescriptions New Prescriptions   No medications on file     Linna Hoff, MD 04/06/16 1318

## 2016-04-15 ENCOUNTER — Encounter (HOSPITAL_COMMUNITY): Payer: Self-pay | Admitting: Emergency Medicine

## 2016-04-15 ENCOUNTER — Ambulatory Visit (HOSPITAL_COMMUNITY)
Admission: EM | Admit: 2016-04-15 | Discharge: 2016-04-15 | Disposition: A | Discharge status: Home / Self Care | Payer: BLUE CROSS/BLUE SHIELD | Attending: Family Medicine | Admitting: Family Medicine

## 2016-04-15 DIAGNOSIS — L600 Ingrowing nail: Secondary | ICD-10-CM | POA: Diagnosis not present

## 2016-04-15 MED ORDER — HYDROCODONE-ACETAMINOPHEN 5-325 MG PO TABS: 1.0000 | ORAL_TABLET | Freq: Four times a day (QID) | ORAL | 0 refills | Status: DC | PRN

## 2016-04-15 MED ORDER — BUPIVACAINE HCL (PF) 0.5 % IJ SOLN
INTRAMUSCULAR | Status: AC
Start: 2016-04-15 — End: 2016-04-15
  Filled 2016-04-15: qty 10

## 2016-04-15 MED ORDER — CLOBETASOL PROPIONATE 0.05 % EX CREA: 1.0000 "application " | TOPICAL_CREAM | Freq: Two times a day (BID) | CUTANEOUS | 0 refills | Status: DC

## 2016-04-15 NOTE — ED Provider Notes (Signed)
[facility]    CSN: [id number] Arrival date & time: 04/15/16  1540     History   Chief Complaint Chief Complaint  Patient presents with  . Ingrown Toenail    HPI Dana [name] is a 30 y.o. female.   The history is provided by the patient.  Toe Pain  This is a new problem. The current episode started 2 days ago (cut her left gt toenail short and now ingrown with pain.). The problem has been gradually worsening.    Past Medical History:  Diagnosis Date  . Abortion history   . 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 Positive PPD     Patient Active Problem List   Diagnosis Date Noted  . Indication for care in labor or delivery 04/09/2015  . Dysmenorrhea 05/05/2011  . TMJ (temporomandibular joint syndrome) 12/12/2010    Past Surgical History:  Procedure Laterality Date  . DILATION AND EVACUATION N/A 04/14/2013   Procedure: DILATATION AND EVACUATION;  Surgeon: Loney Laurence, MD;  Location: [facility];  Service: Gynecology;  Laterality: N/A;  . NO PAST SURGERIES      OB History    Gravida Para Term Preterm AB Living   4 2 1 1 2 2    SAB TAB Ectopic Multiple Live Births   1 1 0 0 2       Home Medications    Prior to Admission medications   Medication Sig Start Date End Date Taking? Authorizing Provider  acetaminophen (TYLENOL) 500 MG tablet Take 1,000 mg by mouth every 6 (six) hours as needed for mild pain.    Historical Provider, MD  cephALEXin (KEFLEX) 500 MG capsule Take 1 capsule (500 mg total) by mouth 4 (four) times daily. Take all of medicine and drink lots of fluids 04/06/16   Linna Hoff, MD  diclofenac sodium (VOLTAREN) 1 % GEL Apply 2 g topically 4 (four) times daily. 12/11/15   Elenora Gamma, MD  ibuprofen (ADVIL,MOTRIN) 600 MG tablet Take 1 tablet (600 mg total) by mouth every 6 (six) hours as needed for mild pain or moderate pain. 06/18/15   Dorathy Kinsman, CNM    Family History Family History  Problem Relation Age of Onset  .  Hypertension Father   . Anesthesia problems Neg Hx     Social History Social History  Substance Use Topics  . Smoking status: Never Smoker  . Smokeless tobacco: Never Used  . Alcohol use No     Allergies   Review of patient's allergies indicates no known allergies.   Review of Systems Review of Systems  Musculoskeletal: Positive for gait problem.  Skin: Positive for wound.  All other systems reviewed and are negative.    Physical Exam Triage Vital Signs ED Triage Vitals [04/15/16 1620]  Enc Vitals Group     BP 108/74     Pulse Rate 74     Resp 12     Temp 98.8 F (37.1 C)     Temp Source Oral     SpO2 99 %     Weight      Height      Head Circumference      Peak Flow      Pain Score 8     Pain Loc      Pain Edu?      Excl. in GC?    No data found.   Updated Vital Signs BP 108/74 (BP Location: Left Arm)   Pulse  74   Temp 98.8 F (37.1 C) (Oral)   Resp 12   SpO2 99%   Visual Acuity Right Eye Distance:   Left Eye Distance:   Bilateral Distance:    Right Eye Near:   Left Eye Near:    Bilateral Near:     Physical Exam  Constitutional: She appears well-developed and well-nourished. She appears distressed.  Musculoskeletal: She exhibits tenderness.  Ingrown medial corner of left gt toenail., no infection.  Neurological: She is alert.  Skin: Skin is warm.  Nursing note and vitals reviewed.    UC Treatments / Results  Labs (all labs ordered are listed, but only abnormal results are displayed) Labs Reviewed - No data to display  EKG  EKG Interpretation None       Radiology No results found.  Procedures .Nail Removal Date/Time: 04/15/2016 5:25 PM Performed by: Bradd CanaryKINDL, JAMES D Authorized by: Bradd CanaryKINDL, JAMES D   Consent:    Risks discussed:  Permanent nail deformity, pain, infection and incomplete removal   Alternatives discussed:  No treatment Location:    Foot:  L big toe Pre-procedure details:    Skin preparation:  Alcohol and  Betadine   Preparation: Patient was prepped and draped in the usual sterile fashion   Anesthesia (see MAR for exact dosages):    Anesthesia method:  Local infiltration   Local anesthetic:  Bupivacaine 0.5% w/o epi Nail Removal:    Nail removed:  Partial   Nail side:  Medial   Nail bed repaired: no     Removed nail replaced and anchored: no   Ingrown nail:    Nail matrix removed or ablated:  None Nails trimmed:    Number of nails trimmed:  1 Post-procedure details:    Dressing:  Petrolatum-impregnated gauze   Patient tolerance of procedure:  Tolerated well, no immediate complications   (including critical care time)  Medications Ordered in UC Medications - No data to display   Initial Impression / Assessment and Plan / UC Course  I have reviewed the triage vital signs and the nursing notes.  Pertinent labs & imaging results that were available during my care of the patient were reviewed by me and considered in my medical decision making (see chart for details).  Clinical Course      Final Clinical Impressions(s) / UC Diagnoses   Final diagnoses:  None    New Prescriptions New Prescriptions   No medications on file     Linna HoffJames D Kindl, MD 04/15/16 1743

## 2016-04-15 NOTE — ED Triage Notes (Signed)
Pt is suffering from an ingrown toenail to her left great toe.  Pt states she cut the nail out herself, but she is having a lot of pain that radiates up her leg.  She also reports having a fever for a few days, but did not measure it at home.

## 2016-05-11 ENCOUNTER — Encounter (HOSPITAL_COMMUNITY): Payer: Self-pay | Admitting: Emergency Medicine

## 2016-05-11 ENCOUNTER — Ambulatory Visit (HOSPITAL_COMMUNITY)
Admission: EM | Admit: 2016-05-11 | Discharge: 2016-05-11 | Disposition: A | Discharge status: Home / Self Care | Payer: BLUE CROSS/BLUE SHIELD | Attending: Emergency Medicine | Admitting: Emergency Medicine

## 2016-05-11 DIAGNOSIS — J0110 Acute frontal sinusitis, unspecified: Secondary | ICD-10-CM | POA: Diagnosis not present

## 2016-05-11 DIAGNOSIS — R509 Fever, unspecified: Secondary | ICD-10-CM | POA: Diagnosis present

## 2016-05-11 DIAGNOSIS — R05 Cough: Secondary | ICD-10-CM | POA: Insufficient documentation

## 2016-05-11 DIAGNOSIS — R51 Headache: Secondary | ICD-10-CM | POA: Diagnosis not present

## 2016-05-11 DIAGNOSIS — M791 Myalgia: Secondary | ICD-10-CM | POA: Diagnosis not present

## 2016-05-11 DIAGNOSIS — O9279 Other disorders of lactation: Secondary | ICD-10-CM

## 2016-05-11 DIAGNOSIS — B181 Chronic viral hepatitis B without delta-agent: Secondary | ICD-10-CM | POA: Diagnosis not present

## 2016-05-11 DIAGNOSIS — J111 Influenza due to unidentified influenza virus with other respiratory manifestations: Secondary | ICD-10-CM

## 2016-05-11 DIAGNOSIS — R059 Cough, unspecified: Secondary | ICD-10-CM

## 2016-05-11 DIAGNOSIS — R69 Illness, unspecified: Secondary | ICD-10-CM

## 2016-05-11 LAB — POCT URINALYSIS DIP (DEVICE)
Bilirubin Urine: NEGATIVE
GLUCOSE, UA: NEGATIVE mg/dL
Leukocytes, UA: NEGATIVE
Nitrite: NEGATIVE
PH: 6 (ref 5.0–8.0)
PROTEIN: 30 mg/dL — AB
SPECIFIC GRAVITY, URINE: 1.025 (ref 1.005–1.030)
UROBILINOGEN UA: 0.2 mg/dL (ref 0.0–1.0)

## 2016-05-11 LAB — POCT RAPID STREP A: Streptococcus, Group A Screen (Direct): NEGATIVE

## 2016-05-11 LAB — POCT PREGNANCY, URINE: Preg Test, Ur: NEGATIVE

## 2016-05-11 MED ORDER — DOXYCYCLINE HYCLATE 100 MG PO CAPS: 100.0000 mg | ORAL_CAPSULE | Freq: Two times a day (BID) | ORAL | 0 refills | Status: DC

## 2016-05-11 MED ORDER — AEROCHAMBER PLUS MISC: 2 refills | Status: DC

## 2016-05-11 MED ORDER — HYDROCOD POLST-CPM POLST ER 10-8 MG/5ML PO SUER: 5.0000 mL | Freq: Two times a day (BID) | ORAL | 0 refills | Status: DC | PRN

## 2016-05-11 MED ORDER — ALBUTEROL SULFATE HFA 108 (90 BASE) MCG/ACT IN AERS: 1.0000 | INHALATION_SPRAY | Freq: Four times a day (QID) | RESPIRATORY_TRACT | 0 refills | Status: DC | PRN

## 2016-05-11 MED ORDER — MOMETASONE FUROATE 50 MCG/ACT NA SUSP: 2.0000 | Freq: Every day | NASAL | 0 refills | Status: DC

## 2016-05-11 MED ORDER — IBUPROFEN 600 MG PO TABS: 600.0000 mg | ORAL_TABLET | Freq: Four times a day (QID) | ORAL | 0 refills | Status: DC | PRN

## 2016-05-11 NOTE — Discharge Instructions (Signed)
Take the medication as written. Return if you get worse, have a fever >100.4, or for any concerns. You may take 600 mg of motrin with 1 gram of tylenol up to 4 times a day as needed for pain. This is an effective combination for pain.   Use a neti pot or the NeilMed sinus rinse as often as you want to to reduce nasal congestion. Follow the directions on the box.   Go to www.goodrx.com to look up your medications. This will give you a list of where you can find your prescriptions at the most affordable prices.    Follow-up with a primary care physician of your choice. See list below Below is a list of primary care practices who are taking new patients for you to follow-up with.   [facility] [phone] [address] [place] [address]  [facility]: [address] [place] [id number]  [phone]  [facility]: [address] [place] [id number]   [phone]  [facility]: [address] [place] [address]  [phone]  [facility] : [address] [place] [id number] [phone]  [facility]: [address] [place] [phone]  [facility]: [address] [place] [id number] [phone]  Dr. Oneal GroutMahima Pandey [address] [facility] [place] [id number]  [phone]  Dr. Jackie PlumGeorge Osei-Bonsu, [facility]. [address] [place] [address]  [phone]) [phone]

## 2016-05-11 NOTE — ED Provider Notes (Signed)
HPI  SUBJECTIVE:  K Southwest AirlinesLis Lieng [name] is a 30 y.o. female who presents with fevers Tmax 102.7, bodyaches, frontal headache with nasal congestion starting 4 days ago. She reports shortness of breath 1 night, but this has since resolved. She reports 1-2 episodes of loose, watery, nonbloody stools per day. She states that she is unable to sleep at night due to coughing. She has tried OTC cold and flu medications without improvement. Symptoms are worse with coughing. She reports a nonproductive cough for 1 week with nasal congestion, sore throat, postnasal drip. She denies neck stiffness, ear pain, sinus pressure, wheezing, chest pain. No rash. No recent antibiotics. No abdominal pain, urinary complaints. She did not get a flu shot this year. Her son currently has pneumonia. She has a past medical history of hepatitis B. No history of asthma, emphysema, COPD, pneumonia, diabetes, hypertension, allergies, GERD. LMP: Patient is amenorrheic, she has the Implanon. She is not sure she may be pregnant. PMD: None.    Past Medical History:  Diagnosis Date  . Abortion history   . 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 Positive PPD     Past Surgical History:  Procedure Laterality Date  . DILATION AND EVACUATION N/A 04/14/2013   Procedure: DILATATION AND EVACUATION;  Surgeon: Loney LaurenceMichelle A Horvath, MD;  Location: [facility];  Service: Gynecology;  Laterality: N/A;  . NO PAST SURGERIES      Family History  Problem Relation Age of Onset  . Hypertension Father   . Anesthesia problems Neg Hx     Social History  Substance Use Topics  . Smoking status: Never Smoker  . Smokeless tobacco: Never Used  . Alcohol use No    No current facility-administered medications for this encounter.   Current Outpatient Prescriptions:  .  albuterol (PROVENTIL HFA;VENTOLIN HFA) 108 (90 Base) MCG/ACT inhaler, Inhale 1-2 puffs into the lungs every 6 (six) hours as needed for wheezing or shortness of breath., Disp: 1 Inhaler, Rfl: 0 .   chlorpheniramine-HYDROcodone (TUSSIONEX PENNKINETIC ER) 10-8 MG/5ML SUER, Take 5 mLs by mouth every 12 (twelve) hours as needed for cough., Disp: 120 mL, Rfl: 0 .  diclofenac sodium (VOLTAREN) 1 % GEL, Apply 2 g topically 4 (four) times daily., Disp: 100 g, Rfl: 0 .  doxycycline (VIBRAMYCIN) 100 MG capsule, Take 1 capsule (100 mg total) by mouth 2 (two) times daily. X 7 days, Disp: 14 capsule, Rfl: 0 .  ibuprofen (ADVIL,MOTRIN) 600 MG tablet, Take 1 tablet (600 mg total) by mouth every 6 (six) hours as needed for mild pain or moderate pain., Disp: 30 tablet, Rfl: 0 .  mometasone (NASONEX) 50 MCG/ACT nasal spray, Place 2 sprays into the nose daily., Disp: 17 g, Rfl: 0 .  Spacer/Aero-Holding Chambers (AEROCHAMBER PLUS) inhaler, Use as instructed, Disp: 1 each, Rfl: 2  No Known Allergies   ROS  As noted in HPI.   Physical Exam  BP 109/76 (BP Location: Right Arm)   Pulse 85   Temp 98.3 F (36.8 C) (Oral)   Resp 15   SpO2 98%   Constitutional: Well developed, well nourished, no acute distress Eyes:  EOMI, conjunctiva normal bilaterally HENT: Normocephalic, atraumatic,mucus membranes moist. TM normal b/l. Mild clear nasal congestion. Erythematous turbinates. + frontal sinus tenderness. oropharynx normal. Positive post nasal drip with cobblestoning. Neck: No cervical adenopathy no meningismus. Respiratory: Normal inspiratory effort lungs clear bilaterally. Good air movement Positive chest wall tenderness Cardiovascular: Normal rate regular rhythm no murmurs rubs or gallops GI: nondistended soft.  Mild suprapubic tenderness. No flank tenderness. No guarding or rebound Back: No CVAT skin: No rash, skin intact Musculoskeletal: no deformities Neurologic: Alert & oriented x 3, no focal neuro deficits Psychiatric: Speech and behavior appropriate   ED Course   Medications - No data to display  Orders Placed This Encounter  Procedures  . Culture, group A strep    Standing Status:    Standing    Number of Occurrences:   1  . POCT rapid strep A [facility])    Standing Status:   Standing    Number of Occurrences:   1  . POCT urinalysis dip (device)    Standing Status:   Standing    Number of Occurrences:   1  . Pregnancy, urine POC    Standing Status:   Standing    Number of Occurrences:   1    Results for orders placed or performed during the hospital encounter of 05/11/16 (from the past 24 hour(s))  POCT rapid strep A [facility])     Status: None   Collection Time: 05/11/16 11:25 AM  Result Value Ref Range   Streptococcus, Group A Screen (Direct) NEGATIVE NEGATIVE  POCT urinalysis dip (device)     Status: Abnormal   Collection Time: 05/11/16 12:24 PM  Result Value Ref Range   Glucose, UA NEGATIVE NEGATIVE mg/dL   Bilirubin Urine NEGATIVE NEGATIVE   Ketones, ur TRACE (A) NEGATIVE mg/dL   Specific Gravity, Urine 1.025 1.005 - 1.030   Hgb urine dipstick TRACE (A) NEGATIVE   pH 6.0 5.0 - 8.0   Protein, ur 30 (A) NEGATIVE mg/dL   Urobilinogen, UA 0.2 0.0 - 1.0 mg/dL   Nitrite NEGATIVE NEGATIVE   Leukocytes, UA NEGATIVE NEGATIVE  Pregnancy, urine POC     Status: None   Collection Time: 05/11/16 12:31 PM  Result Value Ref Range   Preg Test, Ur NEGATIVE NEGATIVE   No results found.  ED Clinical Impression  Influenza-like illness  Acute non-recurrent frontal sinusitis  Cough  Clogged duct, postpartum - Plan: ibuprofen (ADVIL,MOTRIN) 600 MG tablet   ED Assessment/Plan  Strep negative. Patient is not pregnant. She does not have a UTI. She does have trace hemoglobin, some proteinuria, and trace ketones.   Presentation is consistent with a flulike syndrome/sinusitis given the sinus tenderness, fevers and nasal congestion. her lungs are clear. PNA in ddx. Plan to send home with doxycycline which will cover sinusitis and pneumonia, saline nasal irrigation, nasal steroid. also ibuprofen 600 mg, cough syrup, and albuterol inhaler with spacer for her  cough. Will provide primary care referral.  Discussed labs,  MDM, plan and followup with patient . Discussed sn/sx that should prompt return to the ED. Patient agrees with plan.   Meds ordered this encounter  Medications  . ibuprofen (ADVIL,MOTRIN) 600 MG tablet    Sig: Take 1 tablet (600 mg total) by mouth every 6 (six) hours as needed for mild pain or moderate pain.    Dispense:  30 tablet    Refill:  0  . Spacer/Aero-Holding Chambers (AEROCHAMBER PLUS) inhaler    Sig: Use as instructed    Dispense:  1 each    Refill:  2  . mometasone (NASONEX) 50 MCG/ACT nasal spray    Sig: Place 2 sprays into the nose daily.    Dispense:  17 g    Refill:  0  . albuterol (PROVENTIL HFA;VENTOLIN HFA) 108 (90 Base) MCG/ACT inhaler    Sig: Inhale 1-2  puffs into the lungs every 6 (six) hours as needed for wheezing or shortness of breath.    Dispense:  1 Inhaler    Refill:  0  . chlorpheniramine-HYDROcodone (TUSSIONEX PENNKINETIC ER) 10-8 MG/5ML SUER    Sig: Take 5 mLs by mouth every 12 (twelve) hours as needed for cough.    Dispense:  120 mL    Refill:  0  . doxycycline (VIBRAMYCIN) 100 MG capsule    Sig: Take 1 capsule (100 mg total) by mouth 2 (two) times daily. X 7 days    Dispense:  14 capsule    Refill:  0    *This clinic note was created using Scientist, clinical (histocompatibility and immunogenetics)Dragon dictation software. Therefore, there may be occasional mistakes despite careful proofreading.  ?   Domenick GongAshley Annelise Mccoy, MD 05/11/16 [id number]

## 2016-05-11 NOTE — ED Triage Notes (Signed)
C/o cold sx onset x4 days.... Reports it started out w/dry cough x1 week  Sx today include: runny nose, nasal congestion, ST, fevers  A&O x4... NAD

## 2016-05-14 LAB — CULTURE, GROUP A STREP (THRC)

## 2016-07-01 ENCOUNTER — Encounter (HOSPITAL_COMMUNITY): Payer: Self-pay | Admitting: Emergency Medicine

## 2016-07-01 ENCOUNTER — Ambulatory Visit (INDEPENDENT_AMBULATORY_CARE_PROVIDER_SITE_OTHER): Payer: BLUE CROSS/BLUE SHIELD

## 2016-07-01 ENCOUNTER — Ambulatory Visit (HOSPITAL_COMMUNITY)
Admission: EM | Admit: 2016-07-01 | Discharge: 2016-07-01 | Disposition: A | Discharge status: Home / Self Care | Payer: BLUE CROSS/BLUE SHIELD | Attending: Emergency Medicine | Admitting: Emergency Medicine

## 2016-07-01 DIAGNOSIS — R7611 Nonspecific reaction to tuberculin skin test without active tuberculosis: Secondary | ICD-10-CM | POA: Diagnosis not present

## 2016-07-01 DIAGNOSIS — J40 Bronchitis, not specified as acute or chronic: Secondary | ICD-10-CM

## 2016-07-01 DIAGNOSIS — B181 Chronic viral hepatitis B without delta-agent: Secondary | ICD-10-CM | POA: Diagnosis not present

## 2016-07-01 DIAGNOSIS — N61 Mastitis without abscess: Secondary | ICD-10-CM | POA: Diagnosis not present

## 2016-07-01 DIAGNOSIS — N6320 Unspecified lump in the left breast, unspecified quadrant: Secondary | ICD-10-CM | POA: Diagnosis present

## 2016-07-01 DIAGNOSIS — R05 Cough: Secondary | ICD-10-CM | POA: Diagnosis present

## 2016-07-01 LAB — POCT RAPID STREP A: STREPTOCOCCUS, GROUP A SCREEN (DIRECT): NEGATIVE

## 2016-07-01 LAB — POCT URINALYSIS DIP (DEVICE)
Bilirubin Urine: NEGATIVE
Glucose, UA: NEGATIVE mg/dL
KETONES UR: NEGATIVE mg/dL
Leukocytes, UA: NEGATIVE
Nitrite: NEGATIVE
PH: 6.5 (ref 5.0–8.0)
PROTEIN: NEGATIVE mg/dL
SPECIFIC GRAVITY, URINE: 1.025 (ref 1.005–1.030)
Urobilinogen, UA: 1 mg/dL (ref 0.0–1.0)

## 2016-07-01 MED ORDER — BENZONATATE 100 MG PO CAPS: 100.0000 mg | ORAL_CAPSULE | Freq: Three times a day (TID) | ORAL | Status: DC

## 2016-07-01 MED ORDER — BENZONATATE 100 MG PO CAPS: 100.0000 mg | ORAL_CAPSULE | Freq: Three times a day (TID) | ORAL | 0 refills | Status: DC

## 2016-07-01 MED ORDER — CEPHALEXIN 500 MG PO CAPS: 500.0000 mg | ORAL_CAPSULE | Freq: Four times a day (QID) | ORAL | 0 refills | Status: DC

## 2016-07-01 NOTE — ED Triage Notes (Addendum)
Cough for 3 weeks.  Fever returned Monday night. Says temp 101.3 . Reports productive cough with yellow phlegm.  Patient complains of sore throat.  Reports both ears are painful.  Forehead hurts with coughing.   Patient has multiple concerns.  Seen in October 2017 for mastitis.  Reports it did resolve, but Monday night this did reoccur.  Pain in left breast.    Patient reports urinating with hard coughing, but it is painful

## 2016-07-01 NOTE — ED Provider Notes (Signed)
CSN: [id number]     Arrival date & time 07/01/16  1142 History   None    No chief complaint on file.  (Consider location/radiation/quality/duration/timing/severity/associated sxs/prior Treatment) The history is provided by the patient. No language interpreter was used.  Cough  Cough characteristics:  Croupy Sputum characteristics:  Nondescript Severity:  Moderate Onset quality:  Gradual Duration:  3 weeks Timing:  Constant Progression:  Worsening Chronicity:  New Smoker: no   Context: not smoke exposure   Relieved by:  Nothing Worsened by:  Nothing Ineffective treatments:  None tried Associated symptoms: fever   Associated symptoms: no chest pain   Risk factors: no recent infection    Pt complains of a lump in left breast.  Pt recently stopped breat feeding.  Pt has also had a cough for the past 3 weeks,. Past Medical History:  Diagnosis Date  . Abortion history   . 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 Positive PPD    Past Surgical History:  Procedure Laterality Date  . DILATION AND EVACUATION N/A 04/14/2013   Procedure: DILATATION AND EVACUATION;  Surgeon: Loney LaurenceMichelle A Horvath, MD;  Location: [facility];  Service: Gynecology;  Laterality: N/A;  . NO PAST SURGERIES     Family History  Problem Relation Age of Onset  . Hypertension Father   . Anesthesia problems Neg Hx    Social History  Substance Use Topics  . Smoking status: Never Smoker  . Smokeless tobacco: Never Used  . Alcohol use No   OB History    Gravida Para Term Preterm AB Living   4 2 1 1 2 2    SAB TAB Ectopic Multiple Live Births   1 1 0 0 2     Review of Systems  Constitutional: Positive for fever.  Respiratory: Positive for cough.   Cardiovascular: Negative for chest pain.  All other systems reviewed and are negative.   Allergies  Patient has no known allergies.  Home Medications   Prior to Admission medications   Medication Sig Start Date End Date Taking? Authorizing Provider  albuterol  (PROVENTIL HFA;VENTOLIN HFA) 108 (90 Base) MCG/ACT inhaler Inhale 1-2 puffs into the lungs every 6 (six) hours as needed for wheezing or shortness of breath. 05/11/16   Domenick GongAshley Mortenson, MD  benzonatate (TESSALON) 100 MG capsule Take 1 capsule (100 mg total) by mouth every 8 (eight) hours. 07/01/16   Elson AreasLeslie K Sofia, PA-C  cephALEXin (KEFLEX) 500 MG capsule Take 1 capsule (500 mg total) by mouth 4 (four) times daily. 07/01/16   Elson AreasLeslie K Sofia, PA-C  chlorpheniramine-HYDROcodone [facility](TUSSIONEX PENNKINETIC ER) 10-8 MG/5ML SUER Take 5 mLs by mouth every 12 (twelve) hours as needed for cough. Patient not taking: Reported on 07/01/2016 05/11/16   Domenick GongAshley Mortenson, MD  diclofenac sodium (VOLTAREN) 1 % GEL Apply 2 g topically 4 (four) times daily. 12/11/15   Elenora GammaSamuel L Bradshaw, MD  doxycycline (VIBRAMYCIN) 100 MG capsule Take 1 capsule (100 mg total) by mouth 2 (two) times daily. X 7 days Patient not taking: Reported on 07/01/2016 05/11/16   Domenick GongAshley Mortenson, MD  ibuprofen (ADVIL,MOTRIN) 600 MG tablet Take 1 tablet (600 mg total) by mouth every 6 (six) hours as needed for mild pain or moderate pain. 05/11/16   Domenick GongAshley Mortenson, MD  mometasone (NASONEX) 50 MCG/ACT nasal spray Place 2 sprays into the nose daily. Patient not taking: Reported on 07/01/2016 05/11/16   Domenick GongAshley Mortenson, MD  Spacer/Aero-Holding Chambers (AEROCHAMBER PLUS) inhaler Use as instructed 05/11/16   Domenick GongAshley Mortenson, MD  Meds Ordered and Administered this Visit  Medications - No data to display  BP 101/72 (BP Location: Left Arm)   Pulse 92   Temp 98.3 F (36.8 C) (Oral)   Resp 22   SpO2 99%  No data found.   Physical Exam  Constitutional: She appears well-developed and well-nourished. No distress.  HENT:  Head: Normocephalic and atraumatic.  Right Ear: External ear normal.  Left Ear: External ear normal.  Eyes: Conjunctivae are normal.  Neck: Neck supple.  Cardiovascular: Normal rate and regular rhythm.   No murmur  heard. Pulmonary/Chest: Effort normal and breath sounds normal. No respiratory distress.  Abdominal: Soft. There is no tenderness.  Musculoskeletal: She exhibits no edema.  Neurological: She is alert.  Skin: Skin is warm and dry.  Psychiatric: She has a normal mood and affect.  Nursing note and vitals reviewed.   Urgent Care Course   Clinical Course     Procedures (including critical care time)  Labs Review Labs Reviewed  POCT URINALYSIS DIP (DEVICE) - Abnormal; Notable for the following:       Result Value   Hgb urine dipstick SMALL (*)    All other components within normal limits  CULTURE, GROUP A STREP [facility]  POCT RAPID STREP A    Imaging Review Dg Chest 2 View  Result Date: 07/01/2016 CLINICAL DATA:  Cough for 3 weeks, fever starting Monday EXAM: CHEST  2 VIEW COMPARISON:  06/18/2010 FINDINGS: The heart size and mediastinal contours are within normal limits. Both lungs are clear. The visualized skeletal structures are unremarkable. IMPRESSION: No active cardiopulmonary disease. Electronically Signed   By: Natasha MeadLiviu  Pop M.D.   On: 07/01/2016 13:45     Visual Acuity Review  Right Eye Distance:   Left Eye Distance:   Bilateral Distance:    Right Eye Near:   Left Eye Near:    Bilateral Near:         MDM   1. Mastitis   2. Bronchitis    Meds ordered this encounter  Medications  . cephALEXin (KEFLEX) 500 MG capsule    Sig: Take 1 capsule (500 mg total) by mouth 4 (four) times daily.    Dispense:  40 capsule    Refill:  0    Order Specific Question:   Supervising Provider    Answer:   Linna HoffKINDL, JAMES D [phone][[id number]]  . DISCONTD: benzonatate (TESSALON) capsule 100 mg  . benzonatate (TESSALON) 100 MG capsule    Sig: Take 1 capsule (100 mg total) by mouth every 8 (eight) hours.    Dispense:  21 capsule    Refill:  0    Order Specific Question:   Supervising Provider    Answer:   Bradd CanaryKINDL, JAMES D [[id number]]      Lonia SkinnerLeslie K Bonanza Mountain EstatesSofia, PA-C 07/01/16 [phone]

## 2016-07-01 NOTE — ED Notes (Signed)
Throat specimen in lab

## 2016-07-01 NOTE — Discharge Instructions (Signed)
Warm compresses to area of breast swelling.  Recheck in 1 week if area persist.

## 2016-07-04 LAB — CULTURE, GROUP A STREP (THRC)

## 2017-02-22 DIAGNOSIS — R1084 Generalized abdominal pain: Secondary | ICD-10-CM | POA: Insufficient documentation

## 2017-12-14 DIAGNOSIS — A048 Other specified bacterial intestinal infections: Secondary | ICD-10-CM | POA: Insufficient documentation

## 2018-01-19 DIAGNOSIS — K219 Gastro-esophageal reflux disease without esophagitis: Secondary | ICD-10-CM | POA: Insufficient documentation

## 2018-07-13 NOTE — L&D Delivery Note (Signed)
Pt was admitted after she underwent a successful version. She was then started on pit. She rapidly completed the first stage. She had SROM with clear fluid. During the second stage she developed deep varible decels. The VE was placed at +2 station OP. She delivered with one ctx. The baby rotated to OA during the delivery. Nuchal cord x 1. Placenta- S/I EBL-400cc. Baby to [facility]

## 2018-10-04 DIAGNOSIS — Z8751 Personal history of pre-term labor: Secondary | ICD-10-CM | POA: Insufficient documentation

## 2018-10-26 LAB — OB RESULTS CONSOLE HIV ANTIBODY (ROUTINE TESTING): HIV: NONREACTIVE

## 2018-10-26 LAB — OB RESULTS CONSOLE RUBELLA ANTIBODY, IGM: Rubella: IMMUNE

## 2018-10-26 LAB — OB RESULTS CONSOLE ABO/RH: RH Type: POSITIVE

## 2018-10-26 LAB — OB RESULTS CONSOLE ANTIBODY SCREEN: Antibody Screen: NEGATIVE

## 2018-10-26 LAB — OB RESULTS CONSOLE GBS: GBS: POSITIVE

## 2018-10-26 LAB — OB RESULTS CONSOLE GC/CHLAMYDIA
Chlamydia: NEGATIVE
Gonorrhea: NEGATIVE

## 2018-10-26 LAB — OB RESULTS CONSOLE HEPATITIS B SURFACE ANTIGEN: Hepatitis B Surface Ag: POSITIVE

## 2018-10-26 LAB — OB RESULTS CONSOLE RPR: RPR: NONREACTIVE

## 2018-12-23 ENCOUNTER — Encounter (HOSPITAL_COMMUNITY): Payer: Self-pay | Admitting: Emergency Medicine

## 2018-12-23 ENCOUNTER — Inpatient Hospital Stay (EMERGENCY_DEPARTMENT_HOSPITAL)
Admission: AC | Admit: 2018-12-23 | Discharge: 2018-12-23 | Disposition: A | Discharge status: Still In Hospital | Payer: Medicaid Other | Source: Home / Self Care | Attending: Obstetrics and Gynecology | Admitting: Obstetrics and Gynecology

## 2018-12-23 ENCOUNTER — Other Ambulatory Visit: Payer: Self-pay

## 2018-12-23 ENCOUNTER — Emergency Department (HOSPITAL_COMMUNITY)
Admission: EM | Admit: 2018-12-23 | Discharge: 2018-12-23 | Disposition: A | Discharge status: Home / Self Care | Payer: Medicaid Other | Attending: Emergency Medicine | Admitting: Emergency Medicine

## 2018-12-23 ENCOUNTER — Encounter (HOSPITAL_COMMUNITY): Payer: Self-pay

## 2018-12-23 DIAGNOSIS — Z3A20 20 weeks gestation of pregnancy: Secondary | ICD-10-CM | POA: Insufficient documentation

## 2018-12-23 DIAGNOSIS — W1830XA Fall on same level, unspecified, initial encounter: Secondary | ICD-10-CM | POA: Diagnosis not present

## 2018-12-23 DIAGNOSIS — O26892 Other specified pregnancy related conditions, second trimester: Secondary | ICD-10-CM

## 2018-12-23 DIAGNOSIS — S0081XA Abrasion of other part of head, initial encounter: Secondary | ICD-10-CM | POA: Insufficient documentation

## 2018-12-23 DIAGNOSIS — R110 Nausea: Secondary | ICD-10-CM | POA: Diagnosis not present

## 2018-12-23 DIAGNOSIS — Y92531 Health care provider office as the place of occurrence of the external cause: Secondary | ICD-10-CM | POA: Insufficient documentation

## 2018-12-23 DIAGNOSIS — Y939 Activity, unspecified: Secondary | ICD-10-CM | POA: Insufficient documentation

## 2018-12-23 DIAGNOSIS — Y999 Unspecified external cause status: Secondary | ICD-10-CM | POA: Insufficient documentation

## 2018-12-23 DIAGNOSIS — R55 Syncope and collapse: Secondary | ICD-10-CM | POA: Insufficient documentation

## 2018-12-23 NOTE — MAU Note (Signed)
ED called, pt had been at dr's office, received a steroid injection, fainted when leaving office.

## 2018-12-23 NOTE — MAU Provider Note (Signed)
History     CSN: [id number]  Arrival date and time: 12/23/18 1103   First Provider Initiated Contact with Patient 12/23/18 1120      Chief Complaint  Patient presents with  . Loss of Consciousness   K SunGard is a 33 y.o. [id number] at [redacted]w[redacted]d who presents today after loss of consciousness. She was in the office today for 17-p injection. She states that she was fine, but was standing in line to check out. She started to feel hot and dizzy. She went to sit down, and at that she experienced loss of consciousness. She states that fell, hit her head and her knee. She denies any contractions, VB or LOF. She reports that she has been feeling fetal movement. She reports that she has headache and neck pain. She rates this pain 8/10.    OB History    Gravida  5   Para  2   Term  1   Preterm  1   AB  2   Living  2     SAB  1   TAB  1   Ectopic  0   Multiple  0   Live Births  2           Past Medical History:  Diagnosis Date  . Abortion history   . Hepatitis B carrier ([place]) 2003  . Positive PPD     Past Surgical History:  Procedure Laterality Date  . DILATION AND EVACUATION N/A 04/14/2013   Procedure: DILATATION AND EVACUATION;  Surgeon: Daria Pastures, MD;  Location: [place] [facility];  Service: Gynecology;  Laterality: N/A;  . NO PAST SURGERIES      Family History  Problem Relation Age of Onset  . Hypertension Father   . Anesthesia problems Neg Hx     Social History   Tobacco Use  . Smoking status: Never Smoker  . Smokeless tobacco: Never Used  Substance Use Topics  . Alcohol use: No  . Drug use: No    Allergies: No Known Allergies  Medications Prior to Admission  Medication Sig Dispense Refill Last Dose  . albuterol (PROVENTIL HFA;VENTOLIN HFA) 108 (90 Base) MCG/ACT inhaler Inhale 1-2 puffs into the lungs every 6 (six) hours as needed for wheezing or shortness of breath. 1 Inhaler 0   . benzonatate (TESSALON) 100 MG capsule Take 1 capsule (100 mg  total) by mouth every 8 (eight) hours. 21 capsule 0   . cephALEXin (KEFLEX) 500 MG capsule Take 1 capsule (500 mg total) by mouth 4 (four) times daily. 40 capsule 0   . chlorpheniramine-HYDROcodone (TUSSIONEX PENNKINETIC ER) 10-8 MG/5ML SUER Take 5 mLs by mouth every 12 (twelve) hours as needed for cough. (Patient not taking: Reported on 07/01/2016) 120 mL 0   . diclofenac sodium (VOLTAREN) 1 % GEL Apply 2 g topically 4 (four) times daily. 100 g 0   . doxycycline (VIBRAMYCIN) 100 MG capsule Take 1 capsule (100 mg total) by mouth 2 (two) times daily. X 7 days (Patient not taking: Reported on 07/01/2016) 14 capsule 0   . ibuprofen (ADVIL,MOTRIN) 600 MG tablet Take 1 tablet (600 mg total) by mouth every 6 (six) hours as needed for mild pain or moderate pain. 30 tablet 0   . mometasone (NASONEX) 50 MCG/ACT nasal spray Place 2 sprays into the nose daily. (Patient not taking: Reported on 07/01/2016) 17 g 0   . Spacer/Aero-Holding Chambers (AEROCHAMBER PLUS) inhaler Use as instructed 1 each 2  Review of Systems  Constitutional: Negative for fever.  Gastrointestinal: Negative for abdominal pain.  Genitourinary: Negative for pelvic pain, vaginal bleeding and vaginal discharge.  Musculoskeletal: Positive for neck pain.  Neurological: Positive for syncope and headaches.   Physical Exam   Blood pressure 119/74, pulse 87, temperature 97.8 F (36.6 C), temperature source Oral, resp. rate 18, SpO2 100 %, currently breastfeeding.  Physical Exam  Nursing note and vitals reviewed. Constitutional: She is oriented to person, place, and time. She appears well-developed and well-nourished. No distress.  HENT:  Head: Normocephalic.  Cardiovascular: Normal rate.  Respiratory: Effort normal.  GI: Soft. There is no abdominal tenderness. There is no rebound.  Genitourinary:    Genitourinary Comments: Fundus @ U FHT 166 with doppler    Neurological: She is alert and oriented to person, place, and time.   Skin: Skin is warm and dry.  2cmx2cm abrasion lateral to the left eye Less than 1cm abrasion on the left knee   Psychiatric: She has a normal mood and affect.     MAU Course  Procedures  MDM  Patient is obstetrically cleared. She will be sent to the ED for evaluation related to syncope that resulted in hitting her head causing an abrasion.   Assessment and Plan  Syncope Fall Skin abrasion on the head [redacted] weeks gestation   To the ED for complete evaluation  Thressa ShellerHeather Hogan DNP, CNM  12/23/18  11:30 AM

## 2018-12-23 NOTE — ED Provider Notes (Signed)
[place] EMERGENCY DEPARTMENT Provider Note   CSN: [id number] Arrival date & time: 12/23/18  1151     History   Chief Complaint Chief Complaint  Patient presents with  . syncope after Progesterone shot  . [redacted] wks pregnant    HPI Dana [name] is a 33 y.o. female.     The history is provided by the patient. No language interpreter was used.  Loss of Consciousness Episode history:  Single Most recent episode:  Today Progression:  Resolved Chronicity:  New Witnessed: no   Relieved by:  Nothing Worsened by:  Nothing Ineffective treatments:  None tried Associated symptoms: nausea   Pt was at her doctors office for a shot and passed out after standing up.  Pt is 6 months pregnant.  Pt was seen at [facility] and baby was cleared.  Pt his her head.  Pt has soreness to a scrape on side of her head.   Past Medical History:  Diagnosis Date  . Abortion history   . Hepatitis B carrier ([place]) 2003  . Positive PPD     Patient Active Problem List   Diagnosis Date Noted  . Indication for care in labor or delivery 04/09/2015  . Dysmenorrhea 05/05/2011  . TMJ (temporomandibular joint syndrome) 12/12/2010    Past Surgical History:  Procedure Laterality Date  . DILATION AND EVACUATION N/A 04/14/2013   Procedure: DILATATION AND EVACUATION;  Surgeon: Daria Pastures, MD;  Location: [facility];  Service: Gynecology;  Laterality: N/A;  . NO PAST SURGERIES       OB History    Gravida  5   Para  2   Term  1   Preterm  1   AB  2   Living  2     SAB  1   TAB  1   Ectopic  0   Multiple  0   Live Births  2            Home Medications    Prior to Admission medications   Medication Sig Start Date End Date Taking? Authorizing Provider  albuterol (PROVENTIL HFA;VENTOLIN HFA) 108 (90 Base) MCG/ACT inhaler Inhale 1-2 puffs into the lungs every 6 (six) hours as needed for wheezing or shortness of breath. 05/11/16   Melynda Ripple, MD  benzonatate  (TESSALON) 100 MG capsule Take 1 capsule (100 mg total) by mouth every 8 (eight) hours. 07/01/16   Fransico Meadow, PA-C  cephALEXin (KEFLEX) 500 MG capsule Take 1 capsule (500 mg total) by mouth 4 (four) times daily. 07/01/16   Fransico Meadow, PA-C  chlorpheniramine-HYDROcodone (TUSSIONEX PENNKINETIC ER) 10-8 MG/5ML SUER Take 5 mLs by mouth every 12 (twelve) hours as needed for cough. Patient not taking: Reported on 07/01/2016 05/11/16   Melynda Ripple, MD  diclofenac sodium (VOLTAREN) 1 % GEL Apply 2 g topically 4 (four) times daily. 12/11/15   Timmothy Euler, MD  doxycycline (VIBRAMYCIN) 100 MG capsule Take 1 capsule (100 mg total) by mouth 2 (two) times daily. X 7 days Patient not taking: Reported on 07/01/2016 05/11/16   Melynda Ripple, MD  ibuprofen (ADVIL,MOTRIN) 600 MG tablet Take 1 tablet (600 mg total) by mouth every 6 (six) hours as needed for mild pain or moderate pain. 05/11/16   Melynda Ripple, MD  mometasone (NASONEX) 50 MCG/ACT nasal spray Place 2 sprays into the nose daily. Patient not taking: Reported on 07/01/2016 05/11/16   Melynda Ripple, MD  Spacer/Aero-Holding Chambers (AEROCHAMBER PLUS) inhaler  Use as instructed 05/11/16   Domenick GongMortenson, Ashley, MD    Family History Family History  Problem Relation Age of Onset  . Hypertension Father   . Anesthesia problems Neg Hx     Social History Social History   Tobacco Use  . Smoking status: Never Smoker  . Smokeless tobacco: Never Used  Substance Use Topics  . Alcohol use: No  . Drug use: No     Allergies   Patient has no known allergies.   Review of Systems Review of Systems  Cardiovascular: Positive for syncope.  Gastrointestinal: Positive for nausea.  All other systems reviewed and are negative.    Physical Exam Updated Vital Signs BP 102/69   Pulse 79   Temp 98.4 F (36.9 C) (Oral)   Resp 16   Ht 5' (1.524 m)   Wt 69.9 kg   SpO2 98%   BMI 30.08 kg/m   Physical Exam Vitals signs  and nursing note reviewed.  Constitutional:      Appearance: She is well-developed.     Comments: Abrasion face, no gapping   HENT:     Head: Normocephalic.     Nose: Nose normal.     Mouth/Throat:     Mouth: Mucous membranes are moist.  Eyes:     Pupils: Pupils are equal, round, and reactive to light.  Neck:     Musculoskeletal: Normal range of motion.  Cardiovascular:     Rate and Rhythm: Normal rate.  Pulmonary:     Effort: Pulmonary effort is normal.  Abdominal:     General: There is no distension.  Musculoskeletal: Normal range of motion.  Skin:    General: Skin is warm.  Neurological:     General: No focal deficit present.     Mental Status: She is alert and oriented to person, place, and time.  Psychiatric:        Mood and Affect: Mood normal.      ED Treatments / Results  Labs (all labs ordered are listed, but only abnormal results are displayed) Labs Reviewed - No data to display  EKG    Radiology No results found.  Procedures Procedures (including critical care time)  Medications Ordered in ED Medications - No data to display   Initial Impression / Assessment and Plan / ED Course  I have reviewed the triage vital signs and the nursing notes.  Pertinent labs & imaging results that were available during my care of the patient were reviewed by me and considered in my medical decision making (see chart for details).        EKG normal, orthostatic normal MDM   I suspect vasovagal episode after shot.  Pt to keep follow with OB.   Final Clinical Impressions(s) / ED Diagnoses   Final diagnoses:  Syncope and collapse    ED Discharge Orders    None    An After Visit Summary was printed and given to the patient.    Elson AreasSofia, Ki Corbo Dana, [place] JerseyPA-C 12/23/18 1559    Mancel BaleWentz, Elliott, MD 12/23/18 2215

## 2018-12-23 NOTE — Discharge Instructions (Addendum)
Return if any problems.   Tylenol for pain  °

## 2018-12-23 NOTE — ED Notes (Signed)
Pt's mom Gerre Pebbles) left # [phone] for updates

## 2018-12-23 NOTE — MAU Note (Signed)
Pt presents to MAU via EMS, due to passing out at [facility] office after receiving progesterone injection. She was standing at the check out line, felt hot, dizzy and then does not remember anything. She did fall and hit the left side of face, has small abrasion.

## 2018-12-23 NOTE — ED Triage Notes (Signed)
Brought in by EMS. Pt states she was at her drs office and started to feel dizzy and woke up in the floor. C/o right neck, right shoulder pain and a headache. Abrasion noted under left and on forehead.

## 2018-12-23 NOTE — ED Triage Notes (Signed)
Pt sent from MAU after syncope after getting shot at her doc's office. MAU evaluated FHT's and sent pt to ED for further eval.

## 2019-04-25 ENCOUNTER — Encounter (HOSPITAL_COMMUNITY): Payer: Self-pay | Admitting: *Deleted

## 2019-04-25 ENCOUNTER — Telehealth (HOSPITAL_COMMUNITY): Payer: Self-pay | Admitting: *Deleted

## 2019-04-25 NOTE — Telephone Encounter (Signed)
Preadmission screen  

## 2019-04-26 ENCOUNTER — Encounter (HOSPITAL_COMMUNITY): Payer: Self-pay | Admitting: *Deleted

## 2019-04-26 ENCOUNTER — Telehealth (HOSPITAL_COMMUNITY): Payer: Self-pay | Admitting: *Deleted

## 2019-04-26 NOTE — Telephone Encounter (Signed)
Preadmission screen  

## 2019-05-02 ENCOUNTER — Other Ambulatory Visit (HOSPITAL_COMMUNITY)
Admission: RE | Admit: 2019-05-02 | Discharge: 2019-05-02 | Disposition: A | Discharge status: Home / Self Care | Payer: Medicaid Other | Source: Ambulatory Visit | Attending: Obstetrics and Gynecology | Admitting: Obstetrics and Gynecology

## 2019-05-02 ENCOUNTER — Telehealth (HOSPITAL_COMMUNITY): Payer: Self-pay | Admitting: *Deleted

## 2019-05-02 ENCOUNTER — Other Ambulatory Visit: Payer: Self-pay

## 2019-05-02 LAB — SARS CORONAVIRUS 2 BY RT PCR (HOSPITAL ORDER, PERFORMED IN ~~LOC~~ HOSPITAL LAB): SARS Coronavirus 2: NEGATIVE

## 2019-05-02 NOTE — Telephone Encounter (Signed)
Preadmission screen  

## 2019-05-02 NOTE — MAU Note (Signed)
Asymptomatic, swab collected. 

## 2019-05-03 ENCOUNTER — Other Ambulatory Visit: Payer: Self-pay

## 2019-05-03 ENCOUNTER — Inpatient Hospital Stay (HOSPITAL_COMMUNITY): Payer: Medicaid Other | Admitting: Anesthesiology

## 2019-05-03 ENCOUNTER — Encounter (HOSPITAL_COMMUNITY): Payer: Self-pay | Admitting: *Deleted

## 2019-05-03 ENCOUNTER — Other Ambulatory Visit (HOSPITAL_COMMUNITY)
Admission: RE | Admit: 2019-05-03 | Discharge: 2019-05-03 | Disposition: A | Discharge status: Home / Self Care | Payer: Medicaid Other | Source: Ambulatory Visit | Attending: Obstetrics & Gynecology | Admitting: Obstetrics & Gynecology

## 2019-05-03 ENCOUNTER — Inpatient Hospital Stay (HOSPITAL_COMMUNITY): Payer: Medicaid Other

## 2019-05-03 ENCOUNTER — Inpatient Hospital Stay (HOSPITAL_COMMUNITY)
Admission: AD | Admit: 2019-05-03 | Discharge: 2019-05-05 | DRG: 806 | Disposition: A | Discharge status: Home / Self Care | Payer: Medicaid Other | Attending: Obstetrics and Gynecology | Admitting: Obstetrics and Gynecology

## 2019-05-03 DIAGNOSIS — B181 Chronic viral hepatitis B without delta-agent: Secondary | ICD-10-CM | POA: Diagnosis present

## 2019-05-03 DIAGNOSIS — O9842 Viral hepatitis complicating childbirth: Secondary | ICD-10-CM | POA: Diagnosis present

## 2019-05-03 DIAGNOSIS — O09523 Supervision of elderly multigravida, third trimester: Secondary | ICD-10-CM | POA: Diagnosis present

## 2019-05-03 DIAGNOSIS — Z20828 Contact with and (suspected) exposure to other viral communicable diseases: Secondary | ICD-10-CM | POA: Diagnosis present

## 2019-05-03 DIAGNOSIS — Z3A39 39 weeks gestation of pregnancy: Secondary | ICD-10-CM

## 2019-05-03 DIAGNOSIS — O321XX0 Maternal care for breech presentation, not applicable or unspecified: Principal | ICD-10-CM | POA: Diagnosis present

## 2019-05-03 DIAGNOSIS — O99824 Streptococcus B carrier state complicating childbirth: Secondary | ICD-10-CM | POA: Diagnosis present

## 2019-05-03 DIAGNOSIS — Z3490 Encounter for supervision of normal pregnancy, unspecified, unspecified trimester: Secondary | ICD-10-CM

## 2019-05-03 LAB — CBC
HCT: 40 % (ref 36.0–46.0)
Hemoglobin: 12.9 g/dL (ref 12.0–15.0)
MCH: 25.7 pg — ABNORMAL LOW (ref 26.0–34.0)
MCHC: 32.3 g/dL (ref 30.0–36.0)
MCV: 79.8 fL — ABNORMAL LOW (ref 80.0–100.0)
Platelets: 263 10*3/uL (ref 150–400)
RBC: 5.01 MIL/uL (ref 3.87–5.11)
RDW: 15.2 % (ref 11.5–15.5)
WBC: 9.3 10*3/uL (ref 4.0–10.5)
nRBC: 0 % (ref 0.0–0.2)

## 2019-05-03 LAB — COMPREHENSIVE METABOLIC PANEL
ALT: 33 U/L (ref 0–44)
AST: 28 U/L (ref 15–41)
Albumin: 3.1 g/dL — ABNORMAL LOW (ref 3.5–5.0)
Alkaline Phosphatase: 150 U/L — ABNORMAL HIGH (ref 38–126)
Anion gap: 11 (ref 5–15)
BUN: 9 mg/dL (ref 6–20)
CO2: 20 mmol/L — ABNORMAL LOW (ref 22–32)
Calcium: 8.8 mg/dL — ABNORMAL LOW (ref 8.9–10.3)
Chloride: 104 mmol/L (ref 98–111)
Creatinine, Ser: 0.54 mg/dL (ref 0.44–1.00)
GFR calc Af Amer: >60 mL/min (ref >60)
GFR calc non Af Amer: >60 mL/min (ref >60)
Glucose, Bld: 84 mg/dL (ref 70–99)
Potassium: 3.8 mmol/L (ref 3.5–5.1)
Sodium: 135 mmol/L (ref 135–145)
Total Bilirubin: 0.8 mg/dL (ref 0.3–1.2)
Total Protein: 6.1 g/dL — ABNORMAL LOW (ref 6.5–8.1)

## 2019-05-03 LAB — ABO/RH: ABO/RH(D): B POS

## 2019-05-03 LAB — TYPE AND SCREEN
ABO/RH(D): B POS
Antibody Screen: NEGATIVE

## 2019-05-03 LAB — RPR: RPR Ser Ql: NONREACTIVE

## 2019-05-03 MED ORDER — ACETAMINOPHEN 325 MG PO TABS
650.0000 mg | ORAL_TABLET | ORAL | Status: DC | PRN
  Filled 2019-05-03: qty 2

## 2019-05-03 MED ORDER — EPHEDRINE 5 MG/ML INJ: 10.0000 mg | INTRAVENOUS | Status: DC | PRN

## 2019-05-03 MED ORDER — OXYTOCIN 40 UNITS IN NORMAL SALINE INFUSION - SIMPLE MED
INTRAVENOUS | Status: AC
  Filled 2019-05-03: qty 1000

## 2019-05-03 MED ORDER — FLEET ENEMA 7-19 GM/118ML RE ENEM: 1.0000 | ENEMA | RECTAL | Status: DC | PRN

## 2019-05-03 MED ORDER — OXYCODONE-ACETAMINOPHEN 5-325 MG PO TABS
1.0000 | ORAL_TABLET | ORAL | Status: DC | PRN
  Administered 2019-05-04: 1 via ORAL
  Filled 2019-05-03: qty 1

## 2019-05-03 MED ORDER — LACTATED RINGERS IV SOLN: INTRAVENOUS | Status: DC

## 2019-05-03 MED ORDER — ONDANSETRON HCL 4 MG/2ML IJ SOLN: 4.0000 mg | Freq: Four times a day (QID) | INTRAMUSCULAR | Status: DC | PRN

## 2019-05-03 MED ORDER — SOD CITRATE-CITRIC ACID 500-334 MG/5ML PO SOLN: 30.0000 mL | ORAL | Status: DC | PRN

## 2019-05-03 MED ORDER — LACTATED RINGERS IV SOLN: 500.0000 mL | Freq: Once | INTRAVENOUS | Status: DC

## 2019-05-03 MED ORDER — OXYCODONE-ACETAMINOPHEN 5-325 MG PO TABS: 2.0000 | ORAL_TABLET | ORAL | Status: DC | PRN

## 2019-05-03 MED ORDER — PHENYLEPHRINE 40 MCG/ML (10ML) SYRINGE FOR IV PUSH (FOR BLOOD PRESSURE SUPPORT): 80.0000 ug | PREFILLED_SYRINGE | INTRAVENOUS | Status: DC | PRN

## 2019-05-03 MED ORDER — TERBUTALINE SULFATE 1 MG/ML IJ SOLN: 0.2500 mg | Freq: Once | INTRAMUSCULAR | Status: DC | PRN

## 2019-05-03 MED ORDER — ACETAMINOPHEN 325 MG PO TABS: 650.0000 mg | ORAL_TABLET | ORAL | Status: DC | PRN

## 2019-05-03 MED ORDER — FENTANYL CITRATE (PF) 100 MCG/2ML IJ SOLN: 50.0000 ug | INTRAMUSCULAR | Status: DC | PRN

## 2019-05-03 MED ORDER — SODIUM CHLORIDE 0.9 % IV SOLN
5.0000 10*6.[IU] | Freq: Once | INTRAVENOUS | Status: AC
  Administered 2019-05-03: 5 10*6.[IU] via INTRAVENOUS
  Filled 2019-05-03: qty 5

## 2019-05-03 MED ORDER — FENTANYL-BUPIVACAINE-NACL 0.5-0.125-0.9 MG/250ML-% EP SOLN: 12.0000 mL/h | EPIDURAL | Status: DC | PRN

## 2019-05-03 MED ORDER — METHYLERGONOVINE MALEATE 0.2 MG/ML IJ SOLN
0.2000 mg | Freq: Once | INTRAMUSCULAR | Status: AC
  Administered 2019-05-03: 0.2 mg via INTRAMUSCULAR

## 2019-05-03 MED ORDER — SODIUM CHLORIDE 0.9 % IV SOLN: 5.0000 10*6.[IU] | Freq: Once | INTRAVENOUS | Status: DC

## 2019-05-03 MED ORDER — DIPHENHYDRAMINE HCL 50 MG/ML IJ SOLN: 12.5000 mg | INTRAMUSCULAR | Status: DC | PRN

## 2019-05-03 MED ORDER — ONDANSETRON HCL 4 MG PO TABS: 4.0000 mg | ORAL_TABLET | ORAL | Status: DC | PRN

## 2019-05-03 MED ORDER — OXYTOCIN 40 UNITS IN NORMAL SALINE INFUSION - SIMPLE MED: 1.0000 m[IU]/min | INTRAVENOUS | Status: DC

## 2019-05-03 MED ORDER — BENZOCAINE-MENTHOL 20-0.5 % EX AERO: 1.0000 "application " | INHALATION_SPRAY | CUTANEOUS | Status: DC | PRN

## 2019-05-03 MED ORDER — TERBUTALINE SULFATE 1 MG/ML IJ SOLN
0.2500 mg | Freq: Once | INTRAMUSCULAR | Status: AC
  Administered 2019-05-03: 0.25 mg via SUBCUTANEOUS

## 2019-05-03 MED ORDER — OXYCODONE-ACETAMINOPHEN 5-325 MG PO TABS
2.0000 | ORAL_TABLET | ORAL | Status: DC | PRN
  Administered 2019-05-04 (×2): 2 via ORAL
  Filled 2019-05-03 (×2): qty 2

## 2019-05-03 MED ORDER — PENICILLIN G 3 MILLION UNITS IVPB - SIMPLE MED
3.0000 10*6.[IU] | INTRAVENOUS | Status: DC
  Administered 2019-05-03: 3 10*6.[IU] via INTRAVENOUS
  Filled 2019-05-03: qty 100

## 2019-05-03 MED ORDER — OXYTOCIN BOLUS FROM INFUSION: 500.0000 mL | Freq: Once | INTRAVENOUS | Status: DC

## 2019-05-03 MED ORDER — OXYTOCIN 40 UNITS IN NORMAL SALINE INFUSION - SIMPLE MED: 2.5000 [IU]/h | INTRAVENOUS | Status: DC

## 2019-05-03 MED ORDER — ZOLPIDEM TARTRATE 5 MG PO TABS: 5.0000 mg | ORAL_TABLET | Freq: Every evening | ORAL | Status: DC | PRN

## 2019-05-03 MED ORDER — METHYLERGONOVINE MALEATE 0.2 MG/ML IJ SOLN
INTRAMUSCULAR | Status: AC
  Filled 2019-05-03: qty 1

## 2019-05-03 MED ORDER — TETANUS-DIPHTH-ACELL PERTUSSIS 5-2.5-18.5 LF-MCG/0.5 IM SUSP: 0.5000 mL | Freq: Once | INTRAMUSCULAR | Status: DC

## 2019-05-03 MED ORDER — LIDOCAINE HCL (PF) 1 % IJ SOLN: 30.0000 mL | INTRAMUSCULAR | Status: DC | PRN

## 2019-05-03 MED ORDER — SODIUM CHLORIDE 0.9% FLUSH: 3.0000 mL | INTRAVENOUS | Status: DC | PRN

## 2019-05-03 MED ORDER — MISOPROSTOL 25 MCG QUARTER TABLET: 25.0000 ug | ORAL_TABLET | ORAL | Status: DC | PRN

## 2019-05-03 MED ORDER — OXYTOCIN 40 UNITS IN NORMAL SALINE INFUSION - SIMPLE MED
1.0000 m[IU]/min | INTRAVENOUS | Status: DC
  Administered 2019-05-03: 2 m[IU]/min via INTRAVENOUS

## 2019-05-03 MED ORDER — MEASLES, MUMPS & RUBELLA VAC IJ SOLR: 0.5000 mL | Freq: Once | INTRAMUSCULAR | Status: DC

## 2019-05-03 MED ORDER — PENICILLIN G 3 MILLION UNITS IVPB - SIMPLE MED: 3.0000 10*6.[IU] | INTRAVENOUS | Status: DC

## 2019-05-03 MED ORDER — WITCH HAZEL-GLYCERIN EX PADS: 1.0000 "application " | MEDICATED_PAD | CUTANEOUS | Status: DC | PRN

## 2019-05-03 MED ORDER — COCONUT OIL OIL: 1.0000 "application " | TOPICAL_OIL | Status: DC | PRN

## 2019-05-03 MED ORDER — SODIUM CHLORIDE 0.9 % IV SOLN: 250.0000 mL | INTRAVENOUS | Status: DC | PRN

## 2019-05-03 MED ORDER — SIMETHICONE 80 MG PO CHEW
80.0000 mg | CHEWABLE_TABLET | ORAL | Status: DC | PRN
  Filled 2019-05-03: qty 1

## 2019-05-03 MED ORDER — LACTATED RINGERS IV SOLN: 500.0000 mL | INTRAVENOUS | Status: DC | PRN

## 2019-05-03 MED ORDER — IBUPROFEN 600 MG PO TABS
600.0000 mg | ORAL_TABLET | Freq: Four times a day (QID) | ORAL | Status: DC
  Administered 2019-05-04 – 2019-05-05 (×7): 600 mg via ORAL
  Filled 2019-05-03 (×8): qty 1

## 2019-05-03 MED ORDER — LIDOCAINE-EPINEPHRINE (PF) 2 %-1:200000 IJ SOLN
INTRAMUSCULAR | Status: DC | PRN
  Administered 2019-05-03 (×2): 2 mL via EPIDURAL

## 2019-05-03 MED ORDER — ONDANSETRON HCL 4 MG/2ML IJ SOLN: 4.0000 mg | INTRAMUSCULAR | Status: DC | PRN

## 2019-05-03 MED ORDER — LIDOCAINE HCL (PF) 1 % IJ SOLN
30.0000 mL | INTRAMUSCULAR | Status: DC | PRN
  Filled 2019-05-03: qty 30

## 2019-05-03 MED ORDER — OXYCODONE-ACETAMINOPHEN 5-325 MG PO TABS: 1.0000 | ORAL_TABLET | ORAL | Status: DC | PRN

## 2019-05-03 MED ORDER — TERBUTALINE SULFATE 1 MG/ML IJ SOLN
INTRAMUSCULAR | Status: AC
  Filled 2019-05-03: qty 1

## 2019-05-03 MED ORDER — OXYTOCIN BOLUS FROM INFUSION
500.0000 mL | Freq: Once | INTRAVENOUS | Status: AC
  Administered 2019-05-03: 500 mL via INTRAVENOUS

## 2019-05-03 MED ORDER — PHENYLEPHRINE 40 MCG/ML (10ML) SYRINGE FOR IV PUSH (FOR BLOOD PRESSURE SUPPORT)
80.0000 ug | PREFILLED_SYRINGE | INTRAVENOUS | Status: DC | PRN
  Filled 2019-05-03: qty 10

## 2019-05-03 MED ORDER — DIBUCAINE (PERIANAL) 1 % EX OINT: 1.0000 "application " | TOPICAL_OINTMENT | CUTANEOUS | Status: DC | PRN

## 2019-05-03 MED ORDER — SODIUM CHLORIDE 0.9% FLUSH: 3.0000 mL | Freq: Two times a day (BID) | INTRAVENOUS | Status: DC

## 2019-05-03 MED ORDER — FENTANYL-BUPIVACAINE-NACL 0.5-0.125-0.9 MG/250ML-% EP SOLN
12.0000 mL/h | EPIDURAL | Status: DC | PRN
  Filled 2019-05-03: qty 250

## 2019-05-03 MED ORDER — SENNOSIDES-DOCUSATE SODIUM 8.6-50 MG PO TABS
2.0000 | ORAL_TABLET | ORAL | Status: DC
  Administered 2019-05-04 – 2019-05-05 (×2): 2 via ORAL
  Filled 2019-05-03 (×2): qty 2

## 2019-05-03 MED ORDER — SODIUM CHLORIDE (PF) 0.9 % IJ SOLN
INTRAMUSCULAR | Status: DC | PRN
  Administered 2019-05-03: 12 mL/h via EPIDURAL

## 2019-05-03 NOTE — Progress Notes (Signed)
Pt underwent a successful external rotation to vtx. She is scheduled for an induction for ama later in the week . I decided to just start the induction today. Her cx is 30/1-2/-3 vtx.

## 2019-05-03 NOTE — Anesthesia Procedure Notes (Signed)
Epidural Patient location during procedure: OB Start time: 05/03/2019 4:15 PM End time: 05/03/2019 4:30 PM  Staffing Anesthesiologist: Freddrick March, MD Performed: anesthesiologist   Preanesthetic Checklist Completed: patient identified, pre-op evaluation, timeout performed, IV checked, risks and benefits discussed and monitors and equipment checked  Epidural Patient position: sitting Prep: site prepped and draped and DuraPrep Patient monitoring: continuous pulse ox, blood pressure, heart rate and cardiac monitor Approach: midline Location: L3-L4 Injection technique: LOR air  Needle:  Needle type: Tuohy  Needle gauge: 17 G Needle length: 9 cm Needle insertion depth: 6 cm Catheter type: closed end flexible Catheter size: 19 Gauge Catheter at skin depth: 12 cm Test dose: negative  Assessment Sensory level: T8 Events: blood not aspirated, injection not painful, no injection resistance, negative IV test and no paresthesia  Additional Notes Patient identified. Risks/Benefits/Options discussed with patient including but not limited to bleeding, infection, nerve damage, paralysis, failed block, incomplete pain control, headache, blood pressure changes, nausea, vomiting, reactions to medication both or allergic, itching and postpartum back pain. Confirmed with bedside nurse the patient's most recent platelet count. Confirmed with patient that they are not currently taking any anticoagulation, have any bleeding history or any family history of bleeding disorders. Patient expressed understanding and wished to proceed. All questions were answered. Sterile technique was used throughout the entire procedure. Please see nursing notes for vital signs. Test dose was given through epidural catheter and negative prior to continuing to dose epidural or start infusion. Warning signs of high block given to the patient including shortness of breath, tingling/numbness in hands, complete motor block,  or any concerning symptoms with instructions to call for help. Patient was given instructions on fall risk and not to get out of bed. All questions and concerns addressed with instructions to call with any issues or inadequate analgesia.  Reason for block:procedure for pain

## 2019-05-03 NOTE — H&P (Signed)
33 y.o. [id number] @ [redacted]w[redacted]d presents for attempted external cephalic version.  Otherwise has good fetal movement and no bleeding.  Past Medical History:  Diagnosis Date  . Abortion history   . Hepatitis B carrier ([place]) 2003  . Positive PPD     Past Surgical History:  Procedure Laterality Date  . DILATION AND CURETTAGE OF UTERUS    . DILATION AND EVACUATION N/A 04/14/2013   Procedure: DILATATION AND EVACUATION;  Surgeon: Daria Pastures, MD;  Location: [facility];  Service: Gynecology;  Laterality: N/A;  . NO PAST SURGERIES      OB History  Gravida Para Term Preterm AB Living  5 2 1 1 2 2   SAB TAB Ectopic Multiple Live Births  1 1 0 0 2    # Outcome Date GA Lbr Len/2nd Weight Sex Delivery Anes PTL Lv  5 Current           4 Term 04/10/15 [redacted]w[redacted]d 15:37 / 00:09 3314 g M Vag-Spont EPI  LIV  3 Preterm 02/12/12 [redacted]w[redacted]d 10:57 / 00:22 1710 g M Vag-Spont EPI  LIV  2 SAB           1 TAB             Social History   Socioeconomic History  . Marital status: Married    Spouse name: Not on file  . Number of children: Not on file  . Years of education: Not on file  . Highest education level: Not on file  Occupational History  . Not on file  Social Needs  . Financial resource strain: Not on file  . Food insecurity    Worry: Not on file    Inability: Not on file  . Transportation needs    Medical: Not on file    Non-medical: Not on file  Tobacco Use  . Smoking status: Never Smoker  . Smokeless tobacco: Never Used  Substance and Sexual Activity  . Alcohol use: No  . Drug use: No  . Sexual activity: Yes    Birth control/protection: None   Patient has no known allergies.    Prenatal Transfer Tool  Maternal Diabetes: No Genetic Screening: Normal Maternal Ultrasounds/Referrals: Normal Fetal Ultrasounds or other Referrals:  None Maternal Substance Abuse:  No Significant Maternal Medications:  None Significant Maternal Lab Results: Group B Strep positive  ABO, Rh: B/Positive/-- (04/15  0000) Antibody: Negative (04/15 0000) Rubella: Immune (04/15 0000) RPR: Nonreactive (04/15 0000)  HBsAg: Positive (04/15 0000)  HIV: Non-reactive (04/15 0000)  GBS: Positive/-- (04/15 0000)     Other PNC: Chronic hepatitis B, Advanced maternal age, history of PTB.    Vitals:   05/03/19 0733  BP: 123/87  Pulse: 68  Resp: 20  Temp: 98.4 F (36.9 C)  SpO2: 100%     General:  NAD Ex:  no edema FHTs:  Category 1 Toco:  rare  BSUS:  Breech  A/P   33 y.o. [id number] [redacted]w[redacted]d presents with malpresentation for attempted external cephalic version Discussed risks of version to include, but not limited to, fetal heart rate abnormalities, PROM, abruption, need for urgent cesarean delivery, inability to achieve vertex presentation, or return to malpresentation. Patient desires to proceed  GBS positive  [place]

## 2019-05-03 NOTE — Anesthesia Preprocedure Evaluation (Signed)
Anesthesia Evaluation    Reviewed: Allergy & Precautions, Patient's Chart, lab work & pertinent test results  Airway Mallampati: II  TM Distance: >3 FB Neck ROM: Full    Dental no notable dental hx.    Pulmonary neg pulmonary ROS,    Pulmonary exam normal breath sounds clear to auscultation       Cardiovascular negative cardio ROS Normal cardiovascular exam Rhythm:Regular Rate:Normal     Neuro/Psych negative neurological ROS  negative psych ROS   GI/Hepatic negative GI ROS, Neg liver ROS,   Endo/Other  negative endocrine ROS  Renal/GU negative Renal ROS  negative genitourinary   Musculoskeletal negative musculoskeletal ROS (+)   Abdominal   Peds  Hematology negative hematology ROS (+)   Anesthesia Other Findings   Reproductive/Obstetrics (+) Pregnancy                             Anesthesia Physical Anesthesia Plan  ASA: II  Anesthesia Plan: Epidural   Post-op Pain Management:    Induction:   PONV Risk Score and Plan: Treatment may vary due to age or medical condition  Airway Management Planned: Natural Airway  Additional Equipment:   Intra-op Plan:   Post-operative Plan:   Informed Consent: I have reviewed the patients History and Physical, chart, labs and discussed the procedure including the risks, benefits and alternatives for the proposed anesthesia with the patient or authorized representative who has indicated his/her understanding and acceptance.       Plan Discussed with: Anesthesiologist  Anesthesia Plan Comments: (Patient identified. Risks, benefits, options discussed with patient including but not limited to bleeding, infection, nerve damage, paralysis, failed block, incomplete pain control, headache, blood pressure changes, nausea, vomiting, reactions to medication, itching, and post partum back pain. Confirmed with bedside nurse the patient's most recent  platelet count. Confirmed with the patient that they are not taking any anticoagulation, have any bleeding history or any family history of bleeding disorders. Patient expressed understanding and wishes to proceed. All questions were answered. )        Anesthesia Quick Evaluation  

## 2019-05-04 LAB — CBC
HCT: 38.3 % (ref 36.0–46.0)
Hemoglobin: 12.3 g/dL (ref 12.0–15.0)
MCH: 25.4 pg — ABNORMAL LOW (ref 26.0–34.0)
MCHC: 32.1 g/dL (ref 30.0–36.0)
MCV: 79.1 fL — ABNORMAL LOW (ref 80.0–100.0)
Platelets: 231 10*3/uL (ref 150–400)
RBC: 4.84 MIL/uL (ref 3.87–5.11)
RDW: 15.3 % (ref 11.5–15.5)
WBC: 13.4 10*3/uL — ABNORMAL HIGH (ref 4.0–10.5)
nRBC: 0 % (ref 0.0–0.2)

## 2019-05-04 NOTE — Lactation Note (Signed)
This note was copied from a baby's chart. Lactation Consultation Note  Patient Name: Dana [name] Today's Date: 05/04/2019 Reason for consult: Initial assessment;Term P3.  Mom breastfed her previous babies without difficulty. Newborn is 56 hours old and feeding well per mom.  Instructed to feed with feeding cues and call for assist prn.  She denies questions or concerns.  Breastfeeding consultation services information given. Maternal Data Does the patient have breastfeeding experience prior to this delivery?: Yes  Feeding Feeding Type: Breast Fed  LATCH Score                   Interventions    Lactation Tools Discussed/Used     Consult Status Consult Status: Follow-up Date: 05/05/19 Follow-up type: In-patient    Ave Filter 05/04/2019, 2:30 PM

## 2019-05-04 NOTE — Anesthesia Postprocedure Evaluation (Signed)
Anesthesia Post Note  Patient: Dana [name]  Procedure(s) Performed: AN AD HOC LABOR EPIDURAL     Patient location during evaluation: Mother Baby Anesthesia Type: Epidural Level of consciousness: awake and alert and oriented Pain management: satisfactory to patient Vital Signs Assessment: post-procedure vital signs reviewed and stable Respiratory status: respiratory function stable Cardiovascular status: stable Postop Assessment: no headache, no backache, epidural receding, patient able to bend at knees, no signs of nausea or vomiting and adequate PO intake Anesthetic complications: no    Last Vitals:  Vitals:   05/04/19 0300 05/04/19 0508  BP: 110/65 104/69  Pulse: 73 77  Resp: 18 17  Temp: 36.8 C 36.7 C  SpO2: 99% 99%    Last Pain:  Vitals:   05/04/19 0508  TempSrc:   PainSc: 9    Pain Goal:                   [name]

## 2019-05-04 NOTE — Progress Notes (Signed)
Post Partum Day 1 Subjective: no complaints, up ad lib, voiding and tolerating PO  Objective: Vitals:   05/03/19 2110 05/04/19 0300 05/04/19 0508 05/04/19 0920  BP: 112/86 110/65 104/69 106/61  Pulse: 91 73 77 72  Resp: 16 18 17 20   Temp: 99.2 F (37.3 C) 98.3 F (36.8 C) 98 F (36.7 C) 98 F (36.7 C)  TempSrc: Oral   Oral  SpO2: 99% 99% 99% 99%  Weight:      Height:        Physical Exam:  General: alert and no distress Lochia: appropriate Uterine Fundus: firm DVT Evaluation: No evidence of DVT seen on physical exam.  Recent Labs    05/03/19 0845 05/04/19 0517  WBC 9.3 13.4*  HGB 12.9 12.3  HCT 40.0 38.3  PLT 263 231    Recent Labs    05/03/19 0845  NA 135  K 3.8  CL 104  BUN 9  CREATININE 0.54  GLUCOSE 84  BILITOT 0.8  ALT 33  AST 28  ALKPHOS 150*  PROT 6.1*  ALBUMIN 3.1*   Assessment/Plan: Plan for discharge tomorrow  K [name] 33 y.o. G1P1001 PPD#1 sp SVD 1. PPC: Hgb 12.9>12.3, doing well 2. 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

## 2019-05-05 ENCOUNTER — Inpatient Hospital Stay (HOSPITAL_COMMUNITY): Payer: Medicaid Other

## 2019-05-05 MED ORDER — OXYCODONE-ACETAMINOPHEN 5-325 MG PO TABS: 1.0000 | ORAL_TABLET | Freq: Four times a day (QID) | ORAL | 0 refills | Status: DC | PRN

## 2019-05-05 MED ORDER — IBUPROFEN 600 MG PO TABS: 600.0000 mg | ORAL_TABLET | Freq: Four times a day (QID) | ORAL | 0 refills | Status: DC

## 2019-05-05 NOTE — Lactation Note (Signed)
This note was copied from a baby's chart. Lactation Consultation Note  Patient Name: Dana [name] Today's Date: 05/05/2019 Reason for consult: Follow-up assessment;Term;Other (Comment)(no stool in 38 hrs)  LC in to visit with P3 Mom of term baby at 45 hrs old.  Baby at 3% weight loss and latching and feeding well.  No stool noted in 38 hrs, but voiding well, but no recorded void since 1850 yesterday.   Mom holding baby dressed and swaddled.  Offered to assist/assess with positioning and latching baby, as the last latch score was after delivery in the first 2 hrs of life.   Reviewed importance of STS as much as possible and always during feedings at the breast.  Reviewed breast massage and hand expression, colostrum easily expressed in good amount.  Assisted Mom to sit more upright and placed pillow behind her.  Baby supported at breast height, and assisted Mom to support baby's head well.  Mom has large diameter nipples and reviewed importance of baby latching deeply on the breast.  Baby able to attain a deep areolar latch, with nutritive sucks and swallows identified.   Mom taught to use alternate breast compression to increase milk transfer at the breast.    Encouraged Mom to keep baby STS as much as possible  Pediatrician to order abdominal film prior to discharge.   Hand pump given with instructions on use and care.  Mom aware of OP lactation support available    Feeding Feeding Type: Breast Fed  LATCH Score Latch: Grasps breast easily, tongue down, lips flanged, rhythmical sucking.  Audible Swallowing: Spontaneous and intermittent  Type of Nipple: Everted at rest and after stimulation  Comfort (Breast/Nipple): Soft / non-tender  Hold (Positioning): Assistance needed to correctly position infant at breast and maintain latch.  LATCH Score: 9  Interventions Interventions: Breast feeding basics reviewed;Assisted with latch;Skin to skin;Breast massage;Hand  express;Breast compression;Adjust position;Support pillows;Position options;Expressed milk;Hand pump  Lactation Tools Discussed/Used Tools: Pump Breast pump type: Manual   Consult Status Consult Status: Complete Date: 05/05/19 Follow-up type: Call as needed    Broadus John 05/05/2019, 8:45 AM

## 2019-05-05 NOTE — Discharge Summary (Addendum)
Obstetric Discharge Summary Reason for Admission: external cephalic version, induction of labor Prenatal Procedures: ECV Intrapartum Procedures: spontaneous vaginal delivery Postpartum Procedures: none Complications-Operative and Postpartum: none Hemoglobin  Date Value Ref Range Status  05/04/2019 12.3 12.0 - 15.0 g/dL Final  01/11/2012 12.5 g/dL    HCT  Date Value Ref Range Status  05/04/2019 38.3 36.0 - 46.0 % Final  01/11/2012 40 %     Physical Exam:  General: alert, cooperative and appears stated age 33: appropriate Uterine Fundus: firm DVT Evaluation: No evidence of DVT seen on physical exam.  Discharge Diagnoses: Term Pregnancy-delivered  Discharge Information: Date: 05/05/2019 Activity: pelvic rest Diet: routine Medications: PNV and Ibuprofen and percocet  Condition: stable Instructions: refer to practice specific booklet Discharge to: home Follow-up Information    Dana Millers, MD Follow up in 4 week(s).   Specialty: Obstetrics and Gynecology Contact information: [place] [id number] [phone]           Newborn Data: Live born female  Birth Weight: 6 lb 6.9 oz (2917 g) APGAR: 8, 9  Newborn Delivery   Birth date/time: 05/03/2019 17:48:00 Delivery type: Vaginal, Vacuum (Extractor)      Home with mother.  [place] 05/05/2019, 8:51 AM

## 2019-05-05 NOTE — Progress Notes (Signed)
CSW received consult due to score 10 on Edinburgh Depression Screen.    CSW went to speak with MOB at bedside. Upon entering the room CSW congratulated MOB on the birth of infant Dolores Frame). CSW advised MOB of CSW's role and the reason for the visit. MOB reported that the last few weeks she has ben worrying. CSW inquired from [facility] on what she has been worrying about. MOB reported that she just has been worrying. MOB expressed that with two other children at home and with COVID, she has just been worrying. MOB informed CSW that she hasn't been out of her home since March when Elim began. CSW inquired from [facility] if she was feeling depressed or has been feeling depressed and MOB reported "No". CSW inquired from [facility] on what other activities she engages in aside from being with her children. MOB reported that "I clean up". CSW asked MOB if she ever feels like she isn't interested in doing things or if she sleeps a lot. MOB reports that she sometimes doesn't want to do anything but also reports that she is just tired at time as she has one child in school and the other is being homeschooled. MOB denies feeling signs of depression or anxiety. When prompted by CSW MOB denied ever having any mental health history including PPD. MOB reports that she is not feeling SI or HI at this time and expressed that she is not involved in a domestic violence relationship. CSW provided MOB with PPD Checklist as well as therapy resources-although MOB denied needing them at this time. MOB reported that she has a basinet for infant to sleep in once arrived home. Inf[facility] to be [facility] at [facility] for further care.   CSW provided education regarding Baby Blues vs PMADs and provided MOB with resources for mental health follow up.  CSW encouraged MOB to evaluate her mental health throughout the postpartum period with the use of the New Mom Checklist developed by Postpartum Progress as well as the [place] Postnatal Depression Scale and  notify a medical professional if symptoms arise.       Dana [name], MSW, LCSW [facility] [place] [facility] [place] [phone]

## 2019-05-06 ENCOUNTER — Ambulatory Visit: Payer: Self-pay

## 2019-05-06 NOTE — Lactation Note (Addendum)
This note was copied from a baby's chart. Lactation Consultation Note  Patient Name: Dana [name] Today's Date: 05/06/2019 Reason for consult: Follow-up assessment;Term  LC in to visit with P3 Mom of term baby at 7 hrs old.  Baby at 2% weight loss, weight gain of 45gm from yesterday.  Baby is breastfeeding exclusively and latching well.  Breasts filling overnight, and RN set up DEBP and treated her for engorgement with ice and heat alternating.  Mom has fullness under both arms ([place]) which has happened with previous babies.  Mom aware of cabbage leaves under arms treatment, along with ice packs to help with swelling under arms.  Breasts feel full but not engorged.    Mom pumping and expressing 2-3 oz.  Baby latching and softening breasts.    Encouraged Mom to keep baby STS as much as possible, watching for cues and offering breast often.  If breasts are full and baby can not latch, recommend pre-pumping to soften.  If baby is latching and feeding well and breasts soften at end of feeding, Mom to pump for comfort.  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   Mom [facility] loaner pump, paperwork given.   Recommended OP lactation follow-up, Mom states she will call prn.  Interventions Interventions: Breast feeding basics reviewed;Skin to skin;Breast massage;Hand express;DEBP;Hand pump;Ice  Lactation Tools Discussed/Used Tools: Pump Breast pump type: Double-Electric Breast Pump WIC Program: Yes Pump Review: Setup, frequency, and cleaning;Milk Storage Initiated by:: RN Date initiated:: 05/05/19   Consult Status Consult Status: Complete Date: 05/06/19 Follow-up type: Call as needed    Broadus John 05/06/2019, 8:59 AM

## 2020-01-31 ENCOUNTER — Emergency Department (HOSPITAL_BASED_OUTPATIENT_CLINIC_OR_DEPARTMENT_OTHER): Payer: Medicaid Other

## 2020-01-31 ENCOUNTER — Other Ambulatory Visit: Payer: Self-pay

## 2020-01-31 ENCOUNTER — Encounter (HOSPITAL_BASED_OUTPATIENT_CLINIC_OR_DEPARTMENT_OTHER): Payer: Self-pay | Admitting: *Deleted

## 2020-01-31 ENCOUNTER — Emergency Department (HOSPITAL_BASED_OUTPATIENT_CLINIC_OR_DEPARTMENT_OTHER)
Admission: EM | Admit: 2020-01-31 | Discharge: 2020-01-31 | Disposition: A | Discharge status: Home / Self Care | Payer: Medicaid Other | Attending: Emergency Medicine | Admitting: Emergency Medicine

## 2020-01-31 DIAGNOSIS — Z79899 Other long term (current) drug therapy: Secondary | ICD-10-CM | POA: Diagnosis not present

## 2020-01-31 DIAGNOSIS — Y92003 Bedroom of unspecified non-institutional (private) residence as the place of occurrence of the external cause: Secondary | ICD-10-CM | POA: Insufficient documentation

## 2020-01-31 DIAGNOSIS — S060X0A Concussion without loss of consciousness, initial encounter: Secondary | ICD-10-CM | POA: Insufficient documentation

## 2020-01-31 DIAGNOSIS — S0990XA Unspecified injury of head, initial encounter: Secondary | ICD-10-CM | POA: Diagnosis present

## 2020-01-31 DIAGNOSIS — Y9389 Activity, other specified: Secondary | ICD-10-CM | POA: Diagnosis not present

## 2020-01-31 DIAGNOSIS — W208XXA Other cause of strike by thrown, projected or falling object, initial encounter: Secondary | ICD-10-CM | POA: Insufficient documentation

## 2020-01-31 DIAGNOSIS — Y999 Unspecified external cause status: Secondary | ICD-10-CM | POA: Diagnosis not present

## 2020-01-31 NOTE — ED Triage Notes (Signed)
Pt c/o head injury x 3 days , denies LOC , c/o h/a

## 2020-01-31 NOTE — Discharge Instructions (Addendum)
Please take Tylenol (acetaminophen) to relieve your pain.  You may take tylenol, up to 1,000 mg (two extra strength pills).  Do not take more than 3,000 mg tylenol in a 24 hour period.  Please check all medication labels as many medications such as pain and cold medications may contain tylenol. Please do not drink alcohol while taking this medication.  ° °

## 2020-01-31 NOTE — ED Provider Notes (Signed)
[facility] EMERGENCY DEPARTMENT Provider Note   CSN: [id number] Arrival date & time: 01/31/20  1738     History Chief Complaint  Patient presents with  . Head Injury    K [name] is a 34 y.o. female who presents today for evaluation of a head injury 3 days ago.  She reports that 3 days ago she was reaching down to plug something in when the headboard on the bed fell striking the top of her head.  She states that after that she felt dazed and out of it, she denies loss of consciousness.  She states that since yesterday she has had dizziness, pain in the top of her head and her left-sided jaw and vision changes.  She feels like that her vision has floaters across it.  She reports that her head hurts and that she has new decreased sensation in her left arm.  She does not have any new neck pains.  She states that her dog was not struck at all during this.  She feels like her ears popped.  She has taken Tylenol occasionally with mild improvement in her head pain.  She states that the vision is constantly change.  She does not take any blood thinning medications.  She has not had similar before.  She attempted to see her primary care doctor however they did not have open appointments and recommended she go to urgent care.  She states that urgent care told her she needed to come to the ER for a scan.  HPI     Past Medical History:  Diagnosis Date  . Abortion history   . 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 Positive PPD     Patient Active Problem List   Diagnosis Date Noted  . Advanced maternal age in multigravida, third trimester 05/03/2019  . Pregnancy 05/03/2019  . Indication for care in labor or delivery 04/09/2015  . Dysmenorrhea 05/05/2011  . TMJ (temporomandibular joint syndrome) 12/12/2010    Past Surgical History:  Procedure Laterality Date  . DILATION AND CURETTAGE OF UTERUS    . DILATION AND EVACUATION N/A 04/14/2013   Procedure: DILATATION AND EVACUATION;   Surgeon: Loney Laurence, MD;  Location: [facility];  Service: Gynecology;  Laterality: N/A;  . NO PAST SURGERIES       OB History    Gravida  5   Para  3   Term  2   Preterm  1   AB  2   Living  3     SAB  1   TAB  1   Ectopic  0   Multiple  0   Live Births  3           Family History  Problem Relation Age of Onset  . Hypertension Father   . Anesthesia problems Neg Hx     Social History   Tobacco Use  . Smoking status: Never Smoker  . Smokeless tobacco: Never Used  Vaping Use  . Vaping Use: Never used  Substance Use Topics  . Alcohol use: No  . Drug use: No    Home Medications Prior to Admission medications   Medication Sig Start Date End Date Taking? Authorizing Provider  acetaminophen (TYLENOL) 325 MG tablet Take 325 mg by mouth every 6 (six) hours as needed for pain.    [provider]  albuterol (PROVENTIL HFA;VENTOLIN HFA) 108 (90 Base) MCG/ACT inhaler Inhale 1-2 puffs into the lungs every 6 (six) hours  as needed for wheezing or shortness of breath. Patient not taking: Reported on 05/03/2019 05/11/16   Domenick GongMortenson, Ashley, MD  famotidine (PEPCID) 20 MG tablet Take 20 mg by mouth as needed for heartburn. 09/12/18   [provider]  ibuprofen (ADVIL) 600 MG tablet Take 1 tablet (600 mg total) by mouth every 6 (six) hours. 05/05/19   Marlow Baarslark, Dyanna, MD  oxyCODONE-acetaminophen (PERCOCET/ROXICET) 5-325 MG tablet Take 1 tablet by mouth every 6 (six) hours as needed (pain scale 4-7). 05/05/19   Marlow Baarslark, Dyanna, MD  prenatal vitamin w/FE, FA (PRENATAL 1 + 1) 27-1 MG TABS tablet Take 1 tablet by mouth daily.    [provider]    Allergies    Patient has no known allergies.  Review of Systems   Review of Systems  Constitutional: Negative for chills and fever.  HENT: Negative for congestion.   Eyes: Positive for visual disturbance.  Respiratory: Negative for cough, chest tightness and shortness of breath.   Gastrointestinal:  Negative for abdominal pain, nausea and vomiting.  Genitourinary: Negative for dysuria.  Musculoskeletal: Negative for back pain and neck pain.  Skin: Negative for color change, rash and wound.  Neurological: Positive for dizziness and headaches. Negative for speech difficulty.       Left arm paresthesias  Psychiatric/Behavioral: Negative for confusion.    Physical Exam Updated Vital Signs BP 116/80 (BP Location: Right Arm)   Pulse 65   Temp 98.3 F (36.8 C)   Resp 19   Ht 5' (1.524 m)   Wt 72.1 kg   LMP 01/09/2020   SpO2 100%   BMI 31.05 kg/m   Physical Exam Vitals and nursing note reviewed.  Constitutional:      General: She is not in acute distress.    Appearance: She is well-developed. She is not diaphoretic.  HENT:     Head: Normocephalic.     Comments: Contusion present on the vertex of the head more on the left than on the right.  No raccoon's eyes or battle signs bilaterally.    Right Ear: Tympanic membrane, ear canal and external ear normal.     Left Ear: Tympanic membrane, ear canal and external ear normal.     Nose: Nose normal.     Mouth/Throat:     Mouth: Mucous membranes are moist.  Eyes:     General: No scleral icterus.       Right eye: No discharge.        Left eye: No discharge.     Extraocular Movements: Extraocular movements intact.     Conjunctiva/sclera: Conjunctivae normal.     Pupils: Pupils are equal, round, and reactive to light.  Cardiovascular:     Rate and Rhythm: Normal rate and regular rhythm.  Pulmonary:     Effort: Pulmonary effort is normal. No respiratory distress.     Breath sounds: No stridor.  Abdominal:     General: There is no distension.  Musculoskeletal:        General: No deformity.     Cervical back: Normal range of motion and neck supple. No tenderness.     Comments: 5/5 strength bilateral upper and lower extremities.  There is subjective decreased sensation to light touch over the left arm along the upper arm, ulnar  aspect of the lower arm.  Sensation over the radial aspect of the left lower arm and hand is intact to light touch.  Skin:    General: Skin is warm and dry.  Neurological:  Mental Status: She is alert and oriented to person, place, and time.     Cranial Nerves: No cranial nerve deficit.     Motor: No weakness or abnormal muscle tone.  Psychiatric:        Mood and Affect: Mood normal.        Behavior: Behavior normal.     ED Results / Procedures / Treatments   Labs (all labs ordered are listed, but only abnormal results are displayed) Labs Reviewed - No data to display  EKG None  Radiology CT Head Wo Contrast  Result Date: 01/31/2020 CLINICAL DATA:  Head injury 3 days ago, headache EXAM: CT HEAD WITHOUT CONTRAST CT CERVICAL SPINE WITHOUT CONTRAST TECHNIQUE: Multidetector CT imaging of the head and cervical spine was performed following the standard protocol without intravenous contrast. Multiplanar CT image reconstructions of the cervical spine were also generated. COMPARISON:  01/03/2008 FINDINGS: CT HEAD FINDINGS Brain: No acute infarct or hemorrhage. Lateral ventricles and midline structures are unremarkable. No acute extra-axial fluid collections. No mass effect. Vascular: No hyperdense vessel or unexpected calcification. Skull: Normal. Negative for fracture or focal lesion. Sinuses/Orbits: No acute finding. Other: None. CT CERVICAL SPINE FINDINGS Alignment: Alignment is anatomic. Skull base and vertebrae: No acute displaced fracture. Soft tissues and spinal canal: No prevertebral fluid or swelling. No visible canal hematoma. Disc levels:  No significant spondylosis or facet hypertrophy. Upper chest: Airway is patent.  Lung apices are clear. Other: Reconstructed images demonstrate no additional findings. IMPRESSION: 1. No acute intracranial process. 2. No acute cervical spine fracture. Electronically Signed   By: Sharlet Salina M.D.   On: 01/31/2020 18:57   CT Cervical Spine Wo  Contrast  Result Date: 01/31/2020 CLINICAL DATA:  Head injury 3 days ago, headache EXAM: CT HEAD WITHOUT CONTRAST CT CERVICAL SPINE WITHOUT CONTRAST TECHNIQUE: Multidetector CT imaging of the head and cervical spine was performed following the standard protocol without intravenous contrast. Multiplanar CT image reconstructions of the cervical spine were also generated. COMPARISON:  01/03/2008 FINDINGS: CT HEAD FINDINGS Brain: No acute infarct or hemorrhage. Lateral ventricles and midline structures are unremarkable. No acute extra-axial fluid collections. No mass effect. Vascular: No hyperdense vessel or unexpected calcification. Skull: Normal. Negative for fracture or focal lesion. Sinuses/Orbits: No acute finding. Other: None. CT CERVICAL SPINE FINDINGS Alignment: Alignment is anatomic. Skull base and vertebrae: No acute displaced fracture. Soft tissues and spinal canal: No prevertebral fluid or swelling. No visible canal hematoma. Disc levels:  No significant spondylosis or facet hypertrophy. Upper chest: Airway is patent.  Lung apices are clear. Other: Reconstructed images demonstrate no additional findings. IMPRESSION: 1. No acute intracranial process. 2. No acute cervical spine fracture. Electronically Signed   By: Sharlet Salina M.D.   On: 01/31/2020 18:57    Procedures Procedures (including critical care time)  Medications Ordered in ED Medications - No data to display  ED Course  I have reviewed the triage vital signs and the nursing notes.  Pertinent labs & imaging results that were available during my care of the patient were reviewed by me and considered in my medical decision making (see chart for details).    MDM Rules/Calculators/A&P                         Patient is a 34 year old woman who presents today for evaluation of vision changes and headache.  She had a headboard fall on her 3 days ago and since then has had floaters over  the left eye and continued headache.  She also  reports paresthesias in her left arm.  CT head and neck was obtained without evidence of fracture, intracranial hemorrhage or other acute abnormality.  Ocular ultrasound was performed by Dr. Jacqulyn Bath, no evidence of retinal detachment.  Suspect concussion.  Recommended follow-up with concussion clinic, along with ophthalmology for further evaluation.  Tylenol as needed for pain control.  Return precautions were discussed with patient who states their understanding.  At the time of discharge patient denied any unaddressed complaints or concerns.  Patient is agreeable for discharge home.  Note: Portions of this report may have been transcribed using voice recognition software. Every effort was made to ensure accuracy; however, inadvertent computerized transcription errors may be present   Final Clinical Impression(s) / ED Diagnoses Final diagnoses:  Concussion without loss of consciousness, initial encounter    Rx / DC Orders ED Discharge Orders    None       Cristina Gong, PA-C 02/01/20 0005    Maia Plan, MD 02/01/20 1220

## 2020-02-01 ENCOUNTER — Telehealth: Payer: Self-pay | Admitting: Family Medicine

## 2020-02-01 NOTE — Telephone Encounter (Signed)
Appointment scheduled.

## 2020-02-01 NOTE — Progress Notes (Signed)
[facility] - Clinic Note  02/06/2020     CHIEF COMPLAINT Patient presents for Retina Evaluation   HISTORY OF PRESENT ILLNESS: Dana [name] is a 34 y.o. female who presents to the clinic today for:   HPI    Retina Evaluation    In left eye.  This started 1 week ago.  Duration of 1 week.  Associated Symptoms Floaters.  Negative for Blind Spot, Photophobia, Scalp Tenderness, Fever, Pain, Glare, Jaw Claudication, Weight Loss, Distortion, Redness, Trauma, Shoulder/Hip pain, Fatigue and Flashes.  Context:  distance vision, mid-range vision and near vision.  Treatments tried include no treatments.  I, the attending physician,  performed the HPI with the patient and updated documentation appropriately.          Comments    Patient states was cleaning headboard of bed a week ago and headboard fell and hit patient on top of head. Sees spot in vision OS. Spot moves around. Denies flashes. Vision seems OK OU. Eyes itch OS>OD. Head "feels like something tightening around head"--had intermittently, but got worse last night.        Last edited by Rennis Chris, MD on 02/06/2020  8:29 AM. (History)      Referring physician: Verlon Au, MD [address] [place],  [place] [address]  HISTORICAL INFORMATION:   Selected notes from the MEDICAL RECORD NUMBER Hospital f/u.  Blunt head trauma; floaters OS.    CURRENT MEDICATIONS: No current outpatient medications on file. (Ophthalmic Drugs)   No current facility-administered medications for this visit. (Ophthalmic Drugs)   Current Outpatient Medications (Other)  Medication Sig  . acetaminophen (TYLENOL) 325 MG tablet Take 325 mg by mouth every 6 (six) hours as needed for pain.  . famotidine (PEPCID) 20 MG tablet Take 20 mg by mouth as needed for heartburn.  Marland Kitchen ibuprofen (ADVIL) 600 MG tablet Take 1 tablet (600 mg total) by mouth every 6 (six) hours.  . nortriptyline (PAMELOR) 25 MG capsule Take 1-2  capsules (25-50 mg total) by mouth at bedtime as needed for sleep.  Marland Kitchen albuterol (PROVENTIL HFA;VENTOLIN HFA) 108 (90 Base) MCG/ACT inhaler Inhale 1-2 puffs into the lungs every 6 (six) hours as needed for wheezing or shortness of breath. (Patient not taking: Reported on 05/03/2019)   No current facility-administered medications for this visit. (Other)      REVIEW OF SYSTEMS: ROS    Positive for: Eyes   Negative for: Constitutional, Gastrointestinal, Neurological, Skin, Genitourinary, Musculoskeletal, HENT, Endocrine, Cardiovascular, Respiratory, Psychiatric, Allergic/Imm, Heme/Lymph   Last edited by Annalee Genta D, COT on 02/06/2020  8:19 AM. (History)       ALLERGIES No Known Allergies  PAST MEDICAL HISTORY Past Medical History:  Diagnosis Date  . Abortion history   . 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 Positive PPD    Past Surgical History:  Procedure Laterality Date  . DILATION AND CURETTAGE OF UTERUS    . DILATION AND EVACUATION N/A 04/14/2013   Procedure: DILATATION AND EVACUATION;  Surgeon: Loney Laurence, MD;  Location: [facility];  Service: Gynecology;  Laterality: N/A;  . NO PAST SURGERIES      FAMILY HISTORY Family History  Problem Relation Age of Onset  . Hypertension Father   . Anesthesia problems Neg Hx     SOCIAL HISTORY Social History   Tobacco Use  . Smoking status: Never Smoker  . Smokeless tobacco: Never Used  Vaping Use  . Vaping Use:  Never used  Substance Use Topics  . Alcohol use: No  . Drug use: No         OPHTHALMIC EXAM:  Base Eye Exam    Visual Acuity (Snellen - Linear)      Right Left   Dist Stanley 20/20 20/20       Tonometry (Tonopen, 8:27 AM)      Right Left   Pressure 15 15       Pupils      Dark Light Shape React APD   Right 4 3 Round Brisk None   Left 4 3 Round Brisk None       Visual Fields (Counting fingers)      Left Right    Full Full       Extraocular Movement      Right Left    Full, Ortho Full, Ortho        Neuro/Psych    Oriented x3: Yes   Mood/Affect: Normal       Dilation    Both eyes: 1.0% Mydriacyl, 2.5% Phenylephrine @ 8:27 AM        Slit Lamp and Fundus Exam    Slit Lamp Exam      Right Left   Lids/Lashes Normal Dermato   Conjunctiva/Sclera Nasal pinguecula Nasal pinguecula   Cornea 1-2+ inferior PEE, tear film debris 2+ inferior PEE, tear film debris   Anterior Chamber Deep and clear Deep and clear   Iris Round and dilated Round and dilated   Lens Clear Clear   Vitreous Clear Clear, trace condensations       Fundus Exam      Right Left   Disc Pink, sharp Pink, sharp   C/D Ratio 0.3 0.2   Macula Excellent foveal reflex.  No heme or edema. Flat, good foveal reflex   Vessels Normal Normal   Periphery Attached.  No heme. Attached.  No heme.  No RT/RD.        Refraction    Manifest Refraction      Sphere Cylinder Dist VA   Right -0.75 Sphere 20/20   Left -0.50 sphere 20/20          IMAGING AND PROCEDURES  Imaging and Procedures for 02/06/2020  OCT, Retina - OU - Both Eyes       Right Eye Quality was good. Central Foveal Thickness: 255. Progression has no prior data. Findings include normal foveal contour, no SRF, no IRF, vitreomacular adhesion .   Left Eye Quality was good. Central Foveal Thickness: 254. Progression has no prior data. Findings include normal foveal contour, no IRF, no SRF, vitreomacular adhesion .   Notes *Images captured and stored on drive  Diagnosis / Impression:  NFP; no IRF/SRF, VMA OU  Clinical management:  See below  Abbreviations: NFP - Normal foveal profile. CME - cystoid macular edema. PED - pigment epithelial detachment. IRF - intraretinal fluid. SRF - subretinal fluid. EZ - ellipsoid zone. ERM - epiretinal membrane. ORA - outer retinal atrophy. ORT - outer retinal tubulation. SRHM - subretinal hyper-reflective material. IRHM - intraretinal hyper-reflective material                ASSESSMENT/PLAN:     ICD-10-CM   1. Floaters in visual field, left  H43.392   2. 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 Retinal edema  H35.81 OCT, Retina - OU - Both Eyes  4. Dry eyes  H04.123     1,2. Floater OS following head  trauma     - Mechanism of injury: headboard fell on head 7.18.21     - Presented to ED 7.21.21     - Pt reports floater OS -- Present since injury but improving     - No blurred vision or eye pain; no flashes of light     - On exam, focal vitreous condensation OS.  No RT/RD; no heme;      - VA 20/20 OU.     - Discussed findings.     - No ocular intervention indicated or recommended.  Can f/u w/PCP  2. No retinal edema on exam or OCT  3. Dry eyes OU - recommend artificial tears and lubricating ointment as needed   Ophthalmic Meds Ordered this visit:  No orders of the defined types were placed in this encounter.      Return if symptoms worsen or fail to improve.  There are no Patient Instructions on file for this visit.   Explained the diagnoses, plan, and follow up with the patient and they expressed understanding.  Patient expressed understanding of the importance of proper follow up care.  This document serves as a record of services personally performed by Karie Chimera, MD, PhD. It was created on their behalf by Cristopher Estimable, COT an ophthalmic technician. The creation of this record is the provider's dictation and/or activities during the visit.    Electronically signed by: Cristopher Estimable, COT 02/01/20 @ 9:02 AM   This document serves as a record of services personally performed by Karie Chimera, MD, PhD. It was created on their behalf by Cristopher Estimable, COT an ophthalmic technician. The creation of this record is the provider's dictation and/or activities during the visit.    Electronically signed by: Cristopher Estimable, COT 02/06/20 @ 9:02 AM  Karie Chimera, M.D., Ph.D. Diseases & Surgery of the Retina and Vitreous [facility]  I have  reviewed the above documentation for accuracy and completeness, and I agree with the above. Karie Chimera, M.D., Ph.D. 02/06/20 9:02 AM    Abbreviations: M myopia (nearsighted); A astigmatism; H hyperopia (farsighted); P presbyopia; Mrx spectacle prescription;  CTL contact lenses; OD right eye; OS left eye; OU both eyes  XT exotropia; ET esotropia; PEK punctate epithelial keratitis; PEE punctate epithelial erosions; DES dry eye syndrome; MGD meibomian gland dysfunction; ATs artificial tears; PFAT's preservative free artificial tears; NSC nuclear sclerotic cataract; PSC posterior subcapsular cataract; ERM epi-retinal membrane; PVD posterior vitreous detachment; RD retinal detachment; DM diabetes mellitus; DR diabetic retinopathy; NPDR non-proliferative diabetic retinopathy; PDR proliferative diabetic retinopathy; CSME clinically significant macular edema; DME diabetic macular edema; dbh dot blot hemorrhages; CWS cotton wool spot; POAG primary open angle glaucoma; C/D cup-to-disc ratio; HVF humphrey visual field; GVF goldmann visual field; OCT optical coherence tomography; IOP intraocular pressure; BRVO Branch retinal vein occlusion; CRVO central retinal vein occlusion; CRAO central retinal artery occlusion; BRAO branch retinal artery occlusion; RT retinal tear; SB scleral buckle; PPV pars plana vitrectomy; VH Vitreous hemorrhage; PRP panretinal laser photocoagulation; IVK intravitreal kenalog; VMT vitreomacular traction; MH Macular hole;  NVD neovascularization of the disc; NVE neovascularization elsewhere; AREDS age related eye disease study; ARMD age related macular degeneration; POAG primary open angle glaucoma; EBMD epithelial/anterior basement membrane dystrophy; ACIOL anterior chamber intraocular lens; IOL intraocular lens; PCIOL posterior chamber intraocular lens; Phaco/IOL phacoemulsification with intraocular lens placement; PRK photorefractive keratectomy; LASIK laser assisted in situ keratomileusis;  HTN hypertension; DM diabetes mellitus; COPD chronic obstructive pulmonary  disease

## 2020-02-01 NOTE — Telephone Encounter (Signed)
Called patient and left message for her to call back to schedule.

## 2020-02-01 NOTE — Telephone Encounter (Signed)
Patient called requesting to schedule an appointment for the concussion clinic.  She was seen in the ED yesterday.  ED NOTE: Dana [name] is a 34 y.o. female who presents today for evaluation of a head injury 3 days ago.  She reports that 3 days ago she was reaching down to plug something in when the headboard on the bed fell striking the top of her head.  She states that after that she felt dazed and out of it, she denies loss of consciousness.  She states that since yesterday she has had dizziness, pain in the top of her head and her left-sided jaw and vision changes.  She feels like that her vision has floaters across it.  She reports that her head hurts and that she has new decreased sensation in her left arm.  She does not have any new neck pains.  She states that her dog was not struck at all during this.  She feels like her ears popped.  She has taken Tylenol occasionally with mild improvement in her head pain.  She states that the vision is constantly change.  She does not take any blood thinning medications.  She has not had similar before.  She attempted to see her primary care doctor however they did not have open appointments and recommended she go to urgent care.  She states that urgent care told her she needed to come to the ER for a scan.

## 2020-02-02 ENCOUNTER — Encounter: Payer: Self-pay | Admitting: Family Medicine

## 2020-02-02 ENCOUNTER — Ambulatory Visit (INDEPENDENT_AMBULATORY_CARE_PROVIDER_SITE_OTHER): Payer: Medicaid Other | Admitting: Family Medicine

## 2020-02-02 ENCOUNTER — Other Ambulatory Visit: Payer: Self-pay

## 2020-02-02 VITALS — BP 102/70 | HR 73 | Ht 60.0 in | Wt 160.0 lb

## 2020-02-02 DIAGNOSIS — S060X0A Concussion without loss of consciousness, initial encounter: Secondary | ICD-10-CM

## 2020-02-02 MED ORDER — NORTRIPTYLINE HCL 25 MG PO CAPS: 25.0000 mg | ORAL_CAPSULE | Freq: Every evening | ORAL | 2 refills | Status: DC | PRN

## 2020-02-02 NOTE — Patient Instructions (Addendum)
Thank you for coming in today.  Plan for Chiropractor for the neck.  Also use heat and TENS unit.   Take nortriptyline at bedtime for sleep and headache.   Recheck with me in 3 weeks if not better.     Concussion, Adult  A concussion is a brain injury from a hard, direct hit (trauma) to your head or body. This direct hit causes the brain to quickly shake back and forth inside the skull. A concussion may also be called a mild traumatic brain injury (TBI). Healing from this injury can take time. What are the causes? This condition is caused by:  A direct hit to your head, such as: ? Running into a player during a game. ? Being hit in a fight. ? Hitting your head on a hard surface.  A quick and sudden movement (jolt) of the head or neck, such as in a car crash. What are the signs or symptoms? The signs of a concussion can be hard to notice. They may be missed by you, family members, and doctors. You may look fine on the outside but may not act or feel normal. Physical symptoms  Headaches.  Being tired (fatigued).  Being dizzy.  Problems with body balance.  Problems seeing or hearing.  Being sensitive to light or noise.  Feeling sick to your stomach (nausea) or throwing up (vomiting).  Not sleeping or eating as you used to.  Loss of feeling (numbness) or tingling in the body.  Seizure. Mental and emotional symptoms  Problems remembering things.  Trouble focusing your mind (concentrating), organizing, or making decisions.  Being slow to think, act, react, speak, or read.  Feeling grouchy (irritable).  Having mood changes.  Feeling worried or nervous (anxious).  Feeling sad (depressed). How is this treated? This condition may be treated by:  Stopping sports or activity if you are injured. If you hit your head or have signs of concussion: ? Do not return to sports or activities the same day. ? Get checked by a doctor before you return to your  activities.  Resting your body and your mind.  Being watched carefully, often at home.  Medicines to help with symptoms such as: ? Feeling sick to your stomach. ? Headaches. ? Problems with sleep.  Avoid taking strong pain medicines (opioids) for a concussion.  Avoiding alcohol and drugs.  Being asked to go to a concussion clinic or a place to help you recover (rehabilitation center). Recovery from a concussion can take time. Return to activities only:  When you are fully healed.  When your doctor says it is safe. Follow these instructions at home: Activity  Limit activities that need a lot of thought or focus, such as: ? Homework or work for your job. ? Watching TV. ? Using the computer or phone. ? Playing memory games and puzzles.  Rest. Rest helps your brain heal. Make sure you: ? Get plenty of sleep. Most adults should get 7-9 hours of sleep each night. ? Rest during the day. Take naps or breaks when you feel tired.  Avoid activity like exercise until your doctor says its safe. Stop any activity that makes symptoms worse.  Do not do activities that could cause a second concussion, such as riding a bike or playing sports.  Ask your doctor when you can return to your normal activities, such as school, work, sports, and driving. Your ability to react may be slower. Do not do these activities if you are dizzy. General  instructions   Take over-the-counter and prescription medicines only as told by your doctor.  Do not drink alcohol until your doctor says you can.  Watch your symptoms and tell other people to do the same. Other problems can occur after a concussion. Older adults have a higher risk of serious problems.  Tell your work Production designer, theatre/television/film, teachers, Tax adviser, school counselor, coach, or Event organiser about your injury and symptoms. Tell them about what you can or cannot do.  Keep all follow-up visits as told by your doctor. This is important. How is this  prevented?  It is very important that you do not get another brain injury. In rare cases, another injury can cause brain damage that will not go away, brain swelling, or death. The risk of this is greatest in the first 7-10 days after a head injury. To avoid injuries: ? Stop activities that could lead to a second concussion, such as contact sports, until your doctor says it is okay. ? When you return to sports or activities:  Do not crash into other players. This is how most concussions happen.  Follow the rules.  Respect other players. ? Get regular exercise. Do strength and balance training. ? Wear a helmet that fits you well during sports, biking, or other activities.  Helmets can help protect you from serious skull and brain injuries, but they do not protect you from a concussion. Even when wearing a helmet, you should avoid being hit in the head. Contact a doctor if:  Your symptoms get worse or they do not get better.  You have new symptoms.  You have another injury. Get help right away if:  You have bad headaches or your headaches get worse.  You feel weak or numb in any part of your body.  You are mixed up (confused).  Your balance gets worse.  You keep throwing up.  You feel more sleepy than normal.  Your speech is not clear (is slurred).  You cannot recognize people or places.  You have a seizure.  Others have trouble waking you up.  You have behavior changes.  You have changes in how you see (vision).  You pass out (lose consciousness). Summary  A concussion is a brain injury from a hard, direct hit (trauma) to your head or body.  This condition is treated with rest and careful watching of symptoms.  If you keep having symptoms, call your doctor. This information is not intended to replace advice given to you by your health care provider. Make sure you discuss any questions you have with your health care provider. Document Revised: 02/17/2018  Document Reviewed: 02/17/2018 Elsevier Patient Education  2020 [employer].

## 2020-02-02 NOTE — Progress Notes (Signed)
Subjective:    Chief Complaint: Dana [name], LAT, ATC, am serving as scribe for Dr. Clementeen [name].  [name],  is a 34 y.o. female who presents for evaluation of a head injury that occurred on 01/28/20 when she was struck on the top of her head by her headboard that had fallen.  She denies LOC.  She went to the [facility] ED on 01/31/20 c/o dizziness, pain on the top of her head, L jaw pain and visual changes.  Since then, pt reports ear popping, HA, spots in her visual field.  Injury date : 01/28/20 Visit #: 1   History of Present Illness:    Concussion Self-Reported Symptom Score Symptoms rated on a scale 1-6, in last 24 hours   Headache: 5    Nausea: 3  Dizziness: 2  Vomiting: 0  Balance Difficulty: 2   Trouble Falling Asleep: 4   Fatigue: 0  Sleep Less Than Usual: 2  Daytime Drowsiness: 2  Sleep More Than Usual: 3  Photophobia: 0  Phonophobia: 2  Irritability: 2  Sadness: 4  Numbness or Tingling: 4 in [name]  Nervousness: 0  Feeling More Emotional: 0  Feeling Mentally Foggy: 0  Feeling Slowed Down: 0  Memory Problems: 0  Difficulty Concentrating: 0  Visual Problems: 1   Total # of Symptoms: 13/22 Total Symptom Score: 36/132 Previous Symptom Score: N/A   Neck Pain: Yes but this was prior to injury  Tinnitus: Yes  Review of Systems: No fevers or chills  Review of History: Receives care with a chiropractor for her neck pain.  She has had physical therapy and not found it to be helpful previously.  Objective:    Physical Examination Vitals:   02/02/20 1128  BP: 102/70  Pulse: 73  SpO2: 98%   MSK: C-spine normal-appearing nontender decreased cervical motion. Neuro: Alert and oriented normal coordination balance and gait. Psych: Speech thought process and affect.    Radiology: CT Head Wo Contrast  Result Date: 01/31/2020 CLINICAL DATA:  Head injury 3 days ago, headache EXAM: CT HEAD WITHOUT CONTRAST CT CERVICAL SPINE WITHOUT  CONTRAST TECHNIQUE: Multidetector CT imaging of the head and cervical spine was performed following the standard protocol without intravenous contrast. Multiplanar CT image reconstructions of the cervical spine were also generated. COMPARISON:  01/03/2008 FINDINGS: CT HEAD FINDINGS Brain: No acute infarct or hemorrhage. Lateral ventricles and midline structures are unremarkable. No acute extra-axial fluid collections. No mass effect. Vascular: No hyperdense vessel or unexpected calcification. Skull: Normal. Negative for fracture or focal lesion. Sinuses/Orbits: No acute finding. Other: None. CT CERVICAL SPINE FINDINGS Alignment: Alignment is anatomic. Skull base and vertebrae: No acute displaced fracture. Soft tissues and spinal canal: No prevertebral fluid or swelling. No visible canal hematoma. Disc levels:  No significant spondylosis or facet hypertrophy. Upper chest: Airway is patent.  Lung apices are clear. Other: Reconstructed images demonstrate no additional findings. IMPRESSION: 1. No acute intracranial process. 2. No acute cervical spine fracture. Electronically Signed   By: Sharlet Salina M.D.   On: 01/31/2020 18:57   CT Cervical Spine Wo Contrast  Result Date: 01/31/2020 CLINICAL DATA:  Head injury 3 days ago, headache EXAM: CT HEAD WITHOUT CONTRAST CT CERVICAL SPINE WITHOUT CONTRAST TECHNIQUE: Multidetector CT imaging of the head and cervical spine was performed following the standard protocol without intravenous contrast. Multiplanar CT image reconstructions of the cervical spine were also generated. COMPARISON:  01/03/2008 FINDINGS: CT HEAD FINDINGS Brain: No acute  infarct or hemorrhage. Lateral ventricles and midline structures are unremarkable. No acute extra-axial fluid collections. No mass effect. Vascular: No hyperdense vessel or unexpected calcification. Skull: Normal. Negative for fracture or focal lesion. Sinuses/Orbits: No acute finding. Other: None. CT CERVICAL SPINE FINDINGS Alignment:  Alignment is anatomic. Skull base and vertebrae: No acute displaced fracture. Soft tissues and spinal canal: No prevertebral fluid or swelling. No visible canal hematoma. Disc levels:  No significant spondylosis or facet hypertrophy. Upper chest: Airway is patent.  Lung apices are clear. Other: Reconstructed images demonstrate no additional findings. IMPRESSION: 1. No acute intracranial process. 2. No acute cervical spine fracture. Electronically Signed   By: Sharlet Salina M.D.   On: 01/31/2020 18:57   I, Dana [name], personally (independently) visualized and performed the interpretation of the images attached in this note.   Assessment and Plan   34 y.o. female with concussion with headache insomnia neck pain and little bit of dizziness.  Already receiving chiropractic care for her neck pain which should be helpful.  My preference is physical therapy but chiropractor if she already has in his relationship is reasonable for this.  We will add nortriptyline at bedtime for headache and insomnia which should be helpful.  Advance activity as tolerated and recheck in 2 to 3 weeks.        Action/Discussion: Reviewed diagnosis, management options, expected outcomes, and the reasons for scheduled and emergent follow-up. Questions were adequately answered. Patient expressed verbal understanding and agreement with the following plan.     Patient Education:  Reviewed with patient the risks (i.e, a repeat concussion, post-concussion syndrome, second-impact syndrome) of returning to play prior to complete resolution, and thoroughly reviewed the signs and symptoms of concussion.Reviewed need for complete resolution of all symptoms, with rest AND exertion, prior to return to play.  Reviewed red flags for urgent medical evaluation: worsening symptoms, nausea/vomiting, intractable headache, musculoskeletal changes, focal neurological deficits.  [facility] Concussion Care Plan, which clearly  outlines the plans stated above, was given to patient.   In addition to the time spent performing tests, I spent 30 min   Reviewed with patient the risks (i.e, a repeat concussion, post-concussion syndrome, second-impact syndrome) of returning to play prior to complete resolution, and thoroughly reviewed the signs and symptoms of      concussion. Reviewedf need for complete resolution of all symptoms, with rest AND exertion, prior to return to play.  Reviewed red flags for urgent medical evaluation: worsening symptoms, nausea/vomiting, intractable headache, musculoskeletal changes, focal neurological deficits.  [facility] Concussion Care Plan, which clearly outlines the plans stated above, was given to patient   After Visit Summary printed out and provided to patient as appropriate.  The above documentation has been reviewed and is accurate and complete Dana [name]

## 2020-02-06 ENCOUNTER — Encounter (INDEPENDENT_AMBULATORY_CARE_PROVIDER_SITE_OTHER): Payer: Self-pay | Admitting: Ophthalmology

## 2020-02-06 ENCOUNTER — Ambulatory Visit (INDEPENDENT_AMBULATORY_CARE_PROVIDER_SITE_OTHER): Payer: Medicaid Other | Admitting: Ophthalmology

## 2020-02-06 ENCOUNTER — Other Ambulatory Visit: Payer: Self-pay

## 2020-02-06 DIAGNOSIS — H04123 Dry eye syndrome of bilateral lacrimal glands: Secondary | ICD-10-CM | POA: Diagnosis not present

## 2020-02-06 DIAGNOSIS — H43392 Other vitreous opacities, left eye: Secondary | ICD-10-CM

## 2020-02-06 DIAGNOSIS — H3581 Retinal edema: Secondary | ICD-10-CM | POA: Diagnosis not present

## 2020-02-21 ENCOUNTER — Ambulatory Visit (INDEPENDENT_AMBULATORY_CARE_PROVIDER_SITE_OTHER): Payer: Medicaid Other | Admitting: Family Medicine

## 2020-02-21 DIAGNOSIS — Z5329 Procedure and treatment not carried out because of patient's decision for other reasons: Secondary | ICD-10-CM

## 2020-02-21 NOTE — Progress Notes (Signed)
No show

## 2020-02-26 ENCOUNTER — Encounter (HOSPITAL_BASED_OUTPATIENT_CLINIC_OR_DEPARTMENT_OTHER): Payer: Self-pay | Admitting: *Deleted

## 2020-02-26 ENCOUNTER — Other Ambulatory Visit: Payer: Self-pay

## 2020-02-26 ENCOUNTER — Emergency Department (HOSPITAL_BASED_OUTPATIENT_CLINIC_OR_DEPARTMENT_OTHER)
Admission: EM | Admit: 2020-02-26 | Discharge: 2020-02-26 | Disposition: A | Discharge status: Home / Self Care | Payer: Medicaid Other | Attending: Emergency Medicine | Admitting: Emergency Medicine

## 2020-02-26 DIAGNOSIS — M79662 Pain in left lower leg: Secondary | ICD-10-CM | POA: Insufficient documentation

## 2020-02-26 DIAGNOSIS — Z5321 Procedure and treatment not carried out due to patient leaving prior to being seen by health care provider: Secondary | ICD-10-CM | POA: Insufficient documentation

## 2020-02-26 LAB — COMPREHENSIVE METABOLIC PANEL
ALT: 27 U/L (ref 0–44)
AST: 24 U/L (ref 15–41)
Albumin: 4.5 g/dL (ref 3.5–5.0)
Alkaline Phosphatase: 70 U/L (ref 38–126)
Anion gap: 10 (ref 5–15)
BUN: 10 mg/dL (ref 6–20)
CO2: 20 mmol/L — ABNORMAL LOW (ref 22–32)
Calcium: 8.5 mg/dL — ABNORMAL LOW (ref 8.9–10.3)
Chloride: 106 mmol/L (ref 98–111)
Creatinine, Ser: 0.5 mg/dL (ref 0.44–1.00)
GFR calc Af Amer: >60 mL/min (ref >60)
GFR calc non Af Amer: >60 mL/min (ref >60)
Glucose, Bld: 95 mg/dL (ref 70–99)
Potassium: 3.3 mmol/L — ABNORMAL LOW (ref 3.5–5.1)
Sodium: 136 mmol/L (ref 135–145)
Total Bilirubin: 0.4 mg/dL (ref 0.3–1.2)
Total Protein: 7.5 g/dL (ref 6.5–8.1)

## 2020-02-26 LAB — CBC WITH DIFFERENTIAL/PLATELET
Abs Immature Granulocytes: 0.02 10*3/uL (ref 0.00–0.07)
Basophils Absolute: 0 10*3/uL (ref 0.0–0.1)
Basophils Relative: 0 %
Eosinophils Absolute: 0.2 10*3/uL (ref 0.0–0.5)
Eosinophils Relative: 2 %
HCT: 45.9 % (ref 36.0–46.0)
Hemoglobin: 14.7 g/dL (ref 12.0–15.0)
Immature Granulocytes: 0 %
Lymphocytes Relative: 27 %
Lymphs Abs: 2 10*3/uL (ref 0.7–4.0)
MCH: 26.1 pg (ref 26.0–34.0)
MCHC: 32 g/dL (ref 30.0–36.0)
MCV: 81.4 fL (ref 80.0–100.0)
Monocytes Absolute: 0.4 10*3/uL (ref 0.1–1.0)
Monocytes Relative: 5 %
Neutro Abs: 4.9 10*3/uL (ref 1.7–7.7)
Neutrophils Relative %: 66 %
Platelets: 296 10*3/uL (ref 150–400)
RBC: 5.64 MIL/uL — ABNORMAL HIGH (ref 3.87–5.11)
RDW: 14.5 % (ref 11.5–15.5)
WBC: 7.5 10*3/uL (ref 4.0–10.5)
nRBC: 0 % (ref 0.0–0.2)

## 2020-02-26 NOTE — ED Triage Notes (Signed)
C/o left lower leg brusing and pain x 4 days denies injury

## 2020-03-13 LAB — OB RESULTS CONSOLE GC/CHLAMYDIA
Chlamydia: NEGATIVE
Gonorrhea: NEGATIVE

## 2020-03-13 LAB — OB RESULTS CONSOLE HIV ANTIBODY (ROUTINE TESTING): HIV: NONREACTIVE

## 2020-04-17 LAB — OB RESULTS CONSOLE RUBELLA ANTIBODY, IGM: Rubella: IMMUNE

## 2020-04-25 ENCOUNTER — Other Ambulatory Visit: Payer: Self-pay

## 2020-04-25 ENCOUNTER — Inpatient Hospital Stay (HOSPITAL_COMMUNITY)
Admission: AD | Admit: 2020-04-25 | Discharge: 2020-04-25 | Disposition: A | Discharge status: Home / Self Care | Payer: Medicaid Other | Attending: Obstetrics and Gynecology | Admitting: Obstetrics and Gynecology

## 2020-04-25 ENCOUNTER — Encounter (HOSPITAL_COMMUNITY): Payer: Self-pay | Admitting: Obstetrics and Gynecology

## 2020-04-25 DIAGNOSIS — O99611 Diseases of the digestive system complicating pregnancy, first trimester: Secondary | ICD-10-CM | POA: Insufficient documentation

## 2020-04-25 DIAGNOSIS — O09521 Supervision of elderly multigravida, first trimester: Secondary | ICD-10-CM | POA: Diagnosis not present

## 2020-04-25 DIAGNOSIS — O209 Hemorrhage in early pregnancy, unspecified: Secondary | ICD-10-CM | POA: Diagnosis not present

## 2020-04-25 DIAGNOSIS — O98411 Viral hepatitis complicating pregnancy, first trimester: Secondary | ICD-10-CM | POA: Diagnosis not present

## 2020-04-25 DIAGNOSIS — B181 Chronic viral hepatitis B without delta-agent: Secondary | ICD-10-CM | POA: Diagnosis not present

## 2020-04-25 DIAGNOSIS — R109 Unspecified abdominal pain: Secondary | ICD-10-CM

## 2020-04-25 DIAGNOSIS — O4692 Antepartum hemorrhage, unspecified, second trimester: Secondary | ICD-10-CM

## 2020-04-25 DIAGNOSIS — R1030 Lower abdominal pain, unspecified: Secondary | ICD-10-CM | POA: Diagnosis not present

## 2020-04-25 DIAGNOSIS — O26891 Other specified pregnancy related conditions, first trimester: Secondary | ICD-10-CM | POA: Diagnosis present

## 2020-04-25 DIAGNOSIS — K5900 Constipation, unspecified: Secondary | ICD-10-CM | POA: Insufficient documentation

## 2020-04-25 DIAGNOSIS — O4691 Antepartum hemorrhage, unspecified, first trimester: Secondary | ICD-10-CM

## 2020-04-25 DIAGNOSIS — Z3A13 13 weeks gestation of pregnancy: Secondary | ICD-10-CM | POA: Diagnosis not present

## 2020-04-25 DIAGNOSIS — O26899 Other specified pregnancy related conditions, unspecified trimester: Secondary | ICD-10-CM

## 2020-04-25 LAB — URINALYSIS, ROUTINE W REFLEX MICROSCOPIC
Bilirubin Urine: NEGATIVE
Glucose, UA: NEGATIVE mg/dL
Ketones, ur: NEGATIVE mg/dL
Leukocytes,Ua: NEGATIVE
Nitrite: NEGATIVE
Protein, ur: NEGATIVE mg/dL
Specific Gravity, Urine: 1.016 (ref 1.005–1.030)
pH: 7 (ref 5.0–8.0)

## 2020-04-25 NOTE — Discharge Instructions (Signed)
Vaginal Bleeding During Pregnancy, Second Trimester ° °A small amount of bleeding (spotting) from the vagina is relatively common during pregnancy. It usually stops on its own. Various things can cause spotting during pregnancy. Sometimes the bleeding is normal and is not a sign of a problem in the pregnancy. However, bleeding can also be a sign of something serious. Be sure to tell your health care provider about any vaginal bleeding right away. °Some possible causes of vaginal bleeding during the second trimester include: °· Infection, inflammation, or growths (polyps) on the cervix. °· A condition in which the placenta partially or completely covers the opening of the cervix inside the uterus (placenta previa). °· The placenta separating from the uterus (placenta abruption). °· Early (preterm) labor. °· The cervix opening and thinning before pregnancy is at term and before labor starts (cervical insufficiency). °· A mass of tissue developing in the uterus due to an egg being fertilized incorrectly (molar pregnancy). °Follow these instructions at home: °Activity °· Follow instructions from your health care provider about limiting your activity. Ask what activities are safe for you. °· If needed, make plans for someone to help with your regular activities. °· Do not exercise or do activities that take a lot of effort unless your health care provider approves. °· Do not lift anything that is heavier than 10 lb (4.5 kg), or the limit that your health care provider tells you, until he or she says that it is safe. °· Do not have sex or orgasms until your health care provider says that this is safe. °Medicines °· Take over-the-counter and prescription medicines only as told by your health care provider. °· Do not take aspirin because it can cause bleeding. °General instructions °· Pay attention to any changes in your symptoms. °· Write down how many pads you use each day, how often you change pads, and how soaked  (saturated) they are. °· Do not use tampons or douche. °· If you pass any tissue from your vagina, save the tissue so you can show it to your health care provider. °· Keep all follow-up visits as told by your health care provider. This is important. °Contact a health care provider if: °· You have vaginal bleeding during any time of your pregnancy. °· You have cramps or labor pains. °· You have a fever that does not get better when you take medicines. °Get help right away if: °· You have severe cramps in your back or abdomen. °· You have contractions. °· You have chills. °· You pass large clots or a large amount of tissue from your vagina. °· Your bleeding increases. °· You feel light-headed or weak, or you faint. °· You are leaking fluid or have a gush of fluid from your vagina. °Summary °· Various things can cause bleeding or spotting in pregnancy. °· Be sure to tell your health care provider about any vaginal bleeding right away. °· Follow instructions from your health care provider about limiting your activity. Ask what activities are safe for you. °This information is not intended to replace advice given to you by your health care provider. Make sure you discuss any questions you have with your health care provider. °Document Revised: 10/18/2018 Document Reviewed: 10/01/2016 °Elsevier Patient Education © 2020 Elsevier Inc. ° °

## 2020-04-25 NOTE — MAU Provider Note (Signed)
History     CSN: [id number]  Arrival date and time: 04/25/20 9449   First Provider Initiated Contact with Patient 04/25/20 0905      Chief Complaint  Patient presents with  . Abdominal Pain  . Vaginal Bleeding   K [name] is a 34 y.o. [id number] at [redacted]w[redacted]d who presents abdominal cramping & vaginal bleeding. Reports intermittent lower abdominal cramping. Noticed bleeding this morning. Had an episode of bright red blood and small clot. Has not saturated pads. Upon arrival to MAU, saw a spot of dark blood on her underwear. Denies n/v/d, dysuria, recent intercourse, or vaginal discharge. Reports some issues with constipation, last BM was yesterday. Goes to [employer].   Location: abdomen Quality: cramping Severity: 6/10 on pain scale Duration: 1 day Timing: intermittent Modifying factors: none Associated signs and symptoms: vaginal bleeding    OB History    Gravida  6   Para  3   Term  2   Preterm  1   AB  2   Living  3     SAB  1   TAB  1   Ectopic  0   Multiple  0   Live Births  3           Past Medical History:  Diagnosis Date  . Abortion history   . 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 Positive PPD     Past Surgical History:  Procedure Laterality Date  . DILATION AND CURETTAGE OF UTERUS    . DILATION AND EVACUATION N/A 04/14/2013   Procedure: DILATATION AND EVACUATION;  Surgeon: Loney Laurence, MD;  Location: [facility];  Service: Gynecology;  Laterality: N/A;    Family History  Problem Relation Age of Onset  . Hypertension Father   . Anesthesia problems Neg Hx     Social History   Tobacco Use  . Smoking status: Never Smoker  . Smokeless tobacco: Never Used  Vaping Use  . Vaping Use: Never used  Substance Use Topics  . Alcohol use: No  . Drug use: No    Allergies: No Known Allergies  No medications prior to admission.    Review of Systems  Constitutional: Negative.   Gastrointestinal: Positive for abdominal pain and  constipation. Negative for diarrhea, nausea and vomiting.  Genitourinary: Positive for vaginal bleeding. Negative for dysuria and vaginal discharge.   Physical Exam   Blood pressure 111/66, pulse 71, temperature 98.3 F (36.8 C), resp. rate 18, height 5' (1.524 m), weight 73 kg, last menstrual period 02/06/2020, unknown if currently breastfeeding.  Physical Exam Vitals and nursing note reviewed. Exam conducted with a chaperone present.  Constitutional:      Appearance: She is well-developed.  HENT:     Head: Normocephalic and atraumatic.  Pulmonary:     Effort: Pulmonary effort is normal. No respiratory distress.  Abdominal:     Palpations: Abdomen is soft.     Tenderness: There is no abdominal tenderness.  Genitourinary:    General: Normal vulva.     Exam position: Lithotomy position.     Vagina: Normal.     Cervix: No lesion, erythema or cervical bleeding.     Comments: No blood on exam. Small area near os that is friable. Cervix closed/thick.  Neurological:     Mental Status: She is alert.     MAU Course  Procedures Results for orders placed or performed during the hospital encounter of 04/25/20 (from the past 24 hour(s))  Urinalysis,  Routine w reflex microscopic Urine, Clean Catch     Status: Abnormal   Collection Time: 04/25/20  9:19 AM  Result Value Ref Range   Color, Urine YELLOW YELLOW   APPearance HAZY (A) CLEAR   Specific Gravity, Urine 1.016 1.005 - 1.030   pH 7.0 5.0 - 8.0   Glucose, UA NEGATIVE NEGATIVE mg/dL   Hgb urine dipstick MODERATE (A) NEGATIVE   Bilirubin Urine NEGATIVE NEGATIVE   Ketones, ur NEGATIVE NEGATIVE mg/dL   Protein, ur NEGATIVE NEGATIVE mg/dL   Nitrite NEGATIVE NEGATIVE   Leukocytes,Ua NEGATIVE NEGATIVE   RBC / HPF 0-5 0 - 5 RBC/hpf   WBC, UA 0-5 0 - 5 WBC/hpf   Bacteria, UA RARE (A) NONE SEEN   Squamous Epithelial / LPF 21-50 0 - 5   Mucus PRESENT     MDM FHT present via doppler RH positive No active bleeding on exam & cervix  closed/thick Assessment and Plan   1. Abdominal cramping affecting pregnancy  -no bleeding on exam. RH positive. FHT present via doppler. Cervix closed -reviewed bleeding precautions & reasons to return to MAU  2. Vaginal bleeding in pregnancy, second trimester   3. [redacted] weeks gestation of pregnancy      Judeth Horn 04/25/2020, 11:51 AM

## 2020-04-25 NOTE — MAU Note (Signed)
Pt stated she stated having cramping last night with sharp pain on and off. Had some brownish to red blood with a small clot this morning when she went to [facility].

## 2020-07-13 NOTE — L&D Delivery Note (Signed)
Patient was C/C/+2 and pushed for 5 minutes with epidural.    NSVD  female infant, Apgars 8,9, weight P.   The patient had no lacerations. Fundus was firm. EBL was expected amount. Placenta was delivered intact. Vagina was clear.  Delayed cord clamping done for 30-60 seconds while warming baby. Baby was vigorous and doing skin to skin with mother.  Loney Laurence

## 2020-09-04 ENCOUNTER — Emergency Department (HOSPITAL_BASED_OUTPATIENT_CLINIC_OR_DEPARTMENT_OTHER): Payer: No Typology Code available for payment source

## 2020-09-04 ENCOUNTER — Encounter (HOSPITAL_BASED_OUTPATIENT_CLINIC_OR_DEPARTMENT_OTHER): Payer: Self-pay

## 2020-09-04 ENCOUNTER — Other Ambulatory Visit: Payer: Self-pay

## 2020-09-04 ENCOUNTER — Emergency Department (HOSPITAL_BASED_OUTPATIENT_CLINIC_OR_DEPARTMENT_OTHER)
Admission: EM | Admit: 2020-09-04 | Discharge: 2020-09-04 | Disposition: A | Discharge status: Home / Self Care | Payer: No Typology Code available for payment source | Attending: Emergency Medicine | Admitting: Emergency Medicine

## 2020-09-04 DIAGNOSIS — O26893 Other specified pregnancy related conditions, third trimester: Secondary | ICD-10-CM | POA: Diagnosis not present

## 2020-09-04 DIAGNOSIS — S1093XA Contusion of unspecified part of neck, initial encounter: Secondary | ICD-10-CM | POA: Insufficient documentation

## 2020-09-04 DIAGNOSIS — O99613 Diseases of the digestive system complicating pregnancy, third trimester: Secondary | ICD-10-CM | POA: Diagnosis not present

## 2020-09-04 DIAGNOSIS — R000 Tachycardia, unspecified: Secondary | ICD-10-CM | POA: Diagnosis not present

## 2020-09-04 DIAGNOSIS — K219 Gastro-esophageal reflux disease without esophagitis: Secondary | ICD-10-CM | POA: Insufficient documentation

## 2020-09-04 DIAGNOSIS — R0602 Shortness of breath: Secondary | ICD-10-CM | POA: Diagnosis not present

## 2020-09-04 DIAGNOSIS — O99891 Other specified diseases and conditions complicating pregnancy: Secondary | ICD-10-CM | POA: Insufficient documentation

## 2020-09-04 DIAGNOSIS — M549 Dorsalgia, unspecified: Secondary | ICD-10-CM | POA: Insufficient documentation

## 2020-09-04 DIAGNOSIS — Z3A32 32 weeks gestation of pregnancy: Secondary | ICD-10-CM | POA: Insufficient documentation

## 2020-09-04 DIAGNOSIS — O9A213 Injury, poisoning and certain other consequences of external causes complicating pregnancy, third trimester: Secondary | ICD-10-CM | POA: Insufficient documentation

## 2020-09-04 DIAGNOSIS — Y92410 Unspecified street and highway as the place of occurrence of the external cause: Secondary | ICD-10-CM | POA: Diagnosis not present

## 2020-09-04 DIAGNOSIS — R519 Headache, unspecified: Secondary | ICD-10-CM | POA: Insufficient documentation

## 2020-09-04 DIAGNOSIS — R072 Precordial pain: Secondary | ICD-10-CM | POA: Diagnosis not present

## 2020-09-04 DIAGNOSIS — S301XXA Contusion of abdominal wall, initial encounter: Secondary | ICD-10-CM | POA: Diagnosis not present

## 2020-09-04 DIAGNOSIS — Z3490 Encounter for supervision of normal pregnancy, unspecified, unspecified trimester: Secondary | ICD-10-CM

## 2020-09-04 MED ORDER — ACETAMINOPHEN 500 MG PO TABS
1000.0000 mg | ORAL_TABLET | Freq: Once | ORAL | Status: AC
  Administered 2020-09-04: 1000 mg via ORAL
  Filled 2020-09-04: qty 2

## 2020-09-04 NOTE — Progress Notes (Signed)
Spoke with Dr Delray Alt.  Updated on pt status.  Continue with POC for extended monitoring for 4 hours.  No other orders noted.

## 2020-09-04 NOTE — ED Notes (Signed)
Pt ambulatory at d/c verbalizes d/c teaching of follow up with OBGYN on Friday and to call after hours number if any changes with baby, new or worsening pain, or leaking of fluids prior to OBGYN appointment.

## 2020-09-04 NOTE — ED Provider Notes (Signed)
Notified that pt is clear for discharge.   Linwood Dibbles, MD 09/04/20 [phone]

## 2020-09-04 NOTE — Discharge Instructions (Addendum)
call the OB office to make an appt for Friday to be seen.
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
## 2020-09-04 NOTE — ED Notes (Signed)
XR at bedside

## 2020-09-04 NOTE — ED Notes (Signed)
Pt denies any abdominal pain at this time.  No needs expressed.
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
## 2020-09-04 NOTE — Progress Notes (Signed)
Fetal monitoring reactive and reassuring for [redacted] week gestation.  Dr Delray Alt updated. Called Jamie in [facility] to D/C monitoring.  Have patient call office to make an appt for Friday to be seen.  Had an appt today at 4pm that she missed due to ER visit. Cleared by OB service.
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
## 2020-09-04 NOTE — ED Notes (Signed)
[facility] advises to monitor patient for 4 hours.
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
## 2020-09-04 NOTE — ED Notes (Signed)
[facility] states that patient can come off of monitor.

## 2020-09-06 ENCOUNTER — Inpatient Hospital Stay (HOSPITAL_COMMUNITY)
Admission: AD | Admit: 2020-09-06 | Discharge: 2020-09-06 | Disposition: A | Discharge status: Home / Self Care | Payer: Medicaid Other | Attending: Obstetrics & Gynecology | Admitting: Obstetrics & Gynecology

## 2020-09-06 ENCOUNTER — Encounter (HOSPITAL_COMMUNITY): Payer: Self-pay | Admitting: Obstetrics & Gynecology

## 2020-09-06 ENCOUNTER — Inpatient Hospital Stay (HOSPITAL_BASED_OUTPATIENT_CLINIC_OR_DEPARTMENT_OTHER): Payer: Medicaid Other

## 2020-09-06 ENCOUNTER — Other Ambulatory Visit: Payer: Self-pay

## 2020-09-06 DIAGNOSIS — O99891 Other specified diseases and conditions complicating pregnancy: Secondary | ICD-10-CM

## 2020-09-06 DIAGNOSIS — O9A213 Injury, poisoning and certain other consequences of external causes complicating pregnancy, third trimester: Secondary | ICD-10-CM | POA: Diagnosis not present

## 2020-09-06 DIAGNOSIS — O368130 Decreased fetal movements, third trimester, not applicable or unspecified: Secondary | ICD-10-CM | POA: Diagnosis present

## 2020-09-06 DIAGNOSIS — Z3A32 32 weeks gestation of pregnancy: Secondary | ICD-10-CM | POA: Insufficient documentation

## 2020-09-06 DIAGNOSIS — T1490XA Injury, unspecified, initial encounter: Secondary | ICD-10-CM

## 2020-09-06 DIAGNOSIS — O368190 Decreased fetal movements, unspecified trimester, not applicable or unspecified: Secondary | ICD-10-CM

## 2020-09-06 DIAGNOSIS — O09523 Supervision of elderly multigravida, third trimester: Secondary | ICD-10-CM

## 2020-09-06 DIAGNOSIS — Z3689 Encounter for other specified antenatal screening: Secondary | ICD-10-CM | POA: Insufficient documentation

## 2020-09-06 DIAGNOSIS — R109 Unspecified abdominal pain: Secondary | ICD-10-CM | POA: Insufficient documentation

## 2020-09-06 LAB — WET PREP, GENITAL
Clue Cells Wet Prep HPF POC: NONE SEEN
Sperm: NONE SEEN
Trich, Wet Prep: NONE SEEN
Yeast Wet Prep HPF POC: NONE SEEN

## 2020-09-06 LAB — FETAL FIBRONECTIN: Fetal Fibronectin: NEGATIVE

## 2020-09-06 MED ORDER — CYCLOBENZAPRINE HCL 5 MG PO TABS
10.0000 mg | ORAL_TABLET | Freq: Once | ORAL | Status: AC
  Administered 2020-09-06: 10 mg via ORAL
  Filled 2020-09-06: qty 2

## 2020-09-06 MED ORDER — ACETAMINOPHEN 325 MG PO TABS
650.0000 mg | ORAL_TABLET | Freq: Once | ORAL | Status: AC
  Administered 2020-09-06: 650 mg via ORAL
  Filled 2020-09-06: qty 2
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
## 2020-09-06 NOTE — Discharge Instructions (Signed)
Biophysical Profile A biophysical profile is a noninvasive test that checks the health of the developing baby (fetus) and the placenta of the pregnant person. This test may be recommended when a pregnancy is at a higher risk for certain problems. A biophysical profile is usually done during the last 3 months of pregnancy (third trimester). This procedure combines two tests. In one test, a device strapped to your belly will measure your baby's heart rate. The other test uses sound waves and a computer (ultrasound) to create an image of your baby inside your uterus. The ultrasound also measures the amount of fluid (amniotic fluid) inside your uterus. These tests tell your health care provider about the overall health of your baby. Tell a health care provider about:  Any allergies you have.  All medicines you are taking, including vitamins, herbs, eye drops, creams, and over-the-counter medicines.  Any medical conditions you have, such as high blood pressure or diabetes.  Any concerns you have about your pregnancy or any pregnancy-related complications. These may include: ? Abdominal pain or contractions. ? Nausea or vomiting. ? Vaginal bleeding. ? Leaking of amniotic fluid. ? Decreased fetal movements. ? Fever or infection. ? Increased swelling. ? Headaches. ? Problems with your vision.  How often you feel your baby move. What are the risks? There are no risks to you or your baby from a biophysical profile. What happens before the procedure? Ask your health care provider how to prepare for this test. You may be asked to:  Drink fluids so that you have a full bladder for your ultrasound.  Eat before you arrive for the test. This makes your baby more active. What happens during the procedure?  You will lie on your back on an exam table.  A belt with a sensor will be placed around your belly to measure your baby's heart rate.  You may have to wear another belt and sensor to measure  any muscle movements (contractions) in your uterus.  During the ultrasound, a health care provider or technician will put a small amount of gel on your belly and gently roll a handheld device (transducer) over it. This device sends signals to a computer that creates images of your baby.  Five areas of your baby's health and development will be checked during the biophysical profile: ? Heart rate. ? Breathing. ? Movement. ? Active muscle movement (muscle tone). ? The amount of fluid in your uterus (amniotic fluid). The procedure may vary among health care providers and hospitals.      What can I expect after the procedure?  Your health care provider will discuss your results with you. The results of a biophysical profile are scored in a range of 0 to 10. The scoring is as follows: ? Each area that is evaluated is given a score of 0 or 2 points. ? If you get a score of 6 or less, you may need further testing, or your baby may need to be delivered early. ? A score of 8 to 10 with normal amniotic fluid levels is considered normal.  Unless you need additional testing, you can go home right after the procedure and resume your normal activities. Where to find more information  Office on Women's Health: http://hoffman.com/  [employer] of [employer]stetricians and [employer]necologists: www.acog.org Summary  A biophysical profile is a noninvasive test to check that your developing baby (fetus) and your placenta are healthy.  A biophysical profile combines two tests: a test to measure your baby's  heart rate and an ultrasound test to create an image of your baby in the uterus. The ultrasound also measures the amount of amniotic fluid inside your uterus.  Tell your health care provider about any concerns you have about your pregnancy or any pregnancy-related complications.  If you get a score of 6 or less, you may need further testing, or your baby may need to be delivered early. A score of 8 to 10  with normal amniotic fluid levels is considered normal. This information is not intended to replace advice given to you by your health care provider. Make sure you discuss any questions you have with your health care provider. Document Revised: 10/31/2019 Document Reviewed: 10/31/2019 Elsevier Patient Education  2021 [employer].
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
## 2020-09-09 LAB — GC/CHLAMYDIA PROBE AMP (~~LOC~~) NOT AT ARMC
Chlamydia: NEGATIVE
Comment: NEGATIVE
Comment: NORMAL
Neisseria Gonorrhea: NEGATIVE

## 2020-10-10 ENCOUNTER — Inpatient Hospital Stay (HOSPITAL_COMMUNITY)
Admission: AD | Admit: 2020-10-10 | Discharge: 2020-10-10 | Disposition: A | Discharge status: Home / Self Care | Payer: Medicaid Other | Attending: Obstetrics & Gynecology | Admitting: Obstetrics & Gynecology

## 2020-10-10 ENCOUNTER — Encounter (HOSPITAL_COMMUNITY): Payer: Self-pay | Admitting: Obstetrics & Gynecology

## 2020-10-10 ENCOUNTER — Other Ambulatory Visit: Payer: Self-pay

## 2020-10-10 DIAGNOSIS — O479 False labor, unspecified: Secondary | ICD-10-CM

## 2020-10-10 DIAGNOSIS — Z3A37 37 weeks gestation of pregnancy: Secondary | ICD-10-CM | POA: Diagnosis not present

## 2020-10-10 DIAGNOSIS — O471 False labor at or after 37 completed weeks of gestation: Secondary | ICD-10-CM | POA: Diagnosis present

## 2020-10-10 NOTE — MAU Note (Signed)
Had some ctxs last night. Ctxs started back today at 12n. HAving some pelvic pressure and brown spotting. Good FM

## 2020-10-10 NOTE — Discharge Instructions (Signed)
Labor and Vaginal Delivery  Vaginal delivery means that you give birth by pushing your baby out of your birth canal (vagina). A team of health care providers will help you before, during, and after vaginal delivery. Birth experiences are unique for every woman and every pregnancy, and birth experiences vary depending on where you choose to give birth. What happens when I arrive at the birth center or hospital? Once you are in labor and have been admitted into the hospital or birth center, your health care provider may:  Review your pregnancy history and any concerns that you have.  Insert an IV into one of your veins. This may be used to give you fluids and medicines.  Check your blood pressure, pulse, temperature, and heart rate (vital signs).  Check whether your bag of water (amniotic sac) has broken (ruptured).  Talk with you about your birth plan and discuss pain control options. Monitoring Your health care provider may monitor your contractions (uterine monitoring) and your baby's heart rate (fetal monitoring). You may need to be monitored:  Often, but not continuously (intermittently).  All the time or for long periods at a time (continuously). Continuous monitoring may be needed if: ? You are taking certain medicines, such as medicine to relieve pain or make your contractions stronger. ? You have pregnancy or labor complications. Monitoring may be done by:  Placing a special stethoscope or a handheld monitoring device on your abdomen to check your baby's heartbeat and to check for contractions.  Placing monitors on your abdomen (external monitors) to record your baby's heartbeat and the frequency and length of contractions.  Placing monitors inside your uterus through your vagina (internal monitors) to record your baby's heartbeat and the frequency, length, and strength of your contractions. Depending on the type of monitor, it may remain in your uterus or on your baby's head  until birth.  Telemetry. This is a type of continuous monitoring that can be done with external or internal monitors. Instead of having to stay in bed, you are able to move around during telemetry. Physical exam Your health care provider may perform frequent physical exams. This may include:  Checking how and where your baby is positioned in your uterus.  Checking your cervix to determine: ? Whether it is thinning out (effacing). ? Whether it is opening up (dilating). What happens during labor and delivery? Normal labor and delivery is divided into the following three stages: Stage 1  This is the longest stage of labor.  This stage can last for hours or days.  Throughout this stage, you will feel contractions. Contractions generally feel mild, infrequent, and irregular at first. They get stronger, more frequent (about every 2-3 minutes), and more regular as you move through this stage.  This stage ends when your cervix is completely dilated to 4 inches (10 cm) and completely effaced. Stage 2  This stage starts once your cervix is completely effaced and dilated and lasts until the delivery of your baby.  This stage may last from 20 minutes to 2 hours.  This is the stage where you will feel an urge to push your baby out of your vagina.  You may feel stretching and burning pain, especially when the widest part of your baby's head passes through the vaginal opening (crowning).  Once your baby is delivered, the umbilical cord will be clamped and cut. This usually occurs after waiting a period of 1-2 minutes after delivery.  Your baby will be placed on your bare  chest (skin-to-skin contact) in an upright position and covered with a warm blanket. Watch your baby for feeding cues, like rooting or sucking, and help the baby to your breast for his or her first feeding. Stage 3  This stage starts immediately after the birth of your baby and ends after you deliver the placenta.  This stage  may take anywhere from 5 to 30 minutes.  After your baby has been delivered, you will feel contractions as your body expels the placenta and your uterus contracts to control bleeding.   What can I expect after labor and delivery?  After labor is over, you and your baby will be monitored closely until you are ready to go home to ensure that you are both healthy. Your health care team will teach you how to care for yourself and your baby.  You and your baby will stay in the same room (rooming in) during your hospital stay. This will encourage early bonding and successful breastfeeding.  You may continue to receive fluids and medicines through an IV.  Your uterus will be checked and massaged regularly (fundal massage).  You will have some soreness and pain in your abdomen, vagina, and the area of skin between your vaginal opening and your anus (perineum).  If an incision was made near your vagina (episiotomy) or if you had some vaginal tearing during delivery, cold compresses may be placed on your episiotomy or your tear. This helps to reduce pain and swelling.  You may be given a squirt bottle to use instead of wiping when you go to the bathroom. To use the squirt bottle, follow these steps: ? Before you urinate, fill the squirt bottle with warm water. Do not use hot water. ? After you urinate, while you are sitting on the toilet, use the squirt bottle to rinse the area around your urethra and vaginal opening. This rinses away any urine and blood. ? Fill the squirt bottle with clean water every time you use the bathroom.  It is normal to have vaginal bleeding after delivery. Wear a sanitary pad for vaginal bleeding and discharge. Summary  Vaginal delivery means that you will give birth by pushing your baby out of your birth canal (vagina).  Your health care provider may monitor your contractions (uterine monitoring) and your baby's heart rate (fetal monitoring).  Your health care provider  may perform a physical exam.  Normal labor and delivery is divided into three stages.  After labor is over, you and your baby will be monitored closely until you are ready to go home. This information is not intended to replace advice given to you by your health care provider. Make sure you discuss any questions you have with your health care provider. Document Revised: 08/03/2017 Document Reviewed: 08/03/2017 Elsevier Patient Education  2021 [employer].

## 2020-10-10 NOTE — MAU Provider Note (Signed)
S: Ms. Dana [name] is a 35 y.o. [id number] at [redacted]w[redacted]d  who presents to MAU today for labor evaluation.     Cervical exam by RN:  Dilation: 2 Effacement (%): 50 Cervical Position: Posterior Station: Ballotable Presentation: Vertex Exam by:: Nancee Liter RN  Fetal Monitoring: Baseline: 135 Variability: avge Accelerations: present Decelerations: absent Contractions: few  MDM Discussed patient with RN. NST reviewed.   A: [facility] at [redacted]w[redacted]d  False labor  P: Discharge home Labor precautions and kick counts included in AVS Patient to follow-up with office as scheduled  Patient may return to MAU as needed or when in labor   Valora Piccolo 10/10/2020 11:31 PM

## 2020-10-21 ENCOUNTER — Encounter (HOSPITAL_COMMUNITY): Payer: Self-pay | Admitting: *Deleted

## 2020-10-21 ENCOUNTER — Telehealth (HOSPITAL_COMMUNITY): Payer: Self-pay | Admitting: *Deleted

## 2020-10-21 ENCOUNTER — Other Ambulatory Visit: Payer: Self-pay | Admitting: Obstetrics and Gynecology

## 2020-10-21 NOTE — Telephone Encounter (Signed)
Preadmission screen  

## 2020-10-22 ENCOUNTER — Other Ambulatory Visit (HOSPITAL_COMMUNITY)
Admission: RE | Admit: 2020-10-22 | Discharge: 2020-10-22 | Disposition: A | Discharge status: Home / Self Care | Payer: Medicaid Other | Source: Ambulatory Visit | Attending: Obstetrics and Gynecology | Admitting: Obstetrics and Gynecology

## 2020-10-22 DIAGNOSIS — Z20822 Contact with and (suspected) exposure to covid-19: Secondary | ICD-10-CM | POA: Insufficient documentation

## 2020-10-22 DIAGNOSIS — Z01812 Encounter for preprocedural laboratory examination: Secondary | ICD-10-CM | POA: Diagnosis present

## 2020-10-22 LAB — SARS CORONAVIRUS 2 (TAT 6-24 HRS): SARS Coronavirus 2: NEGATIVE

## 2020-10-24 ENCOUNTER — Inpatient Hospital Stay (HOSPITAL_COMMUNITY)
Admission: AD | Admit: 2020-10-24 | Discharge: 2020-10-26 | DRG: 807 | Disposition: A | Discharge status: Home / Self Care | Payer: Medicaid Other | Attending: Obstetrics and Gynecology | Admitting: Obstetrics and Gynecology

## 2020-10-24 ENCOUNTER — Other Ambulatory Visit: Payer: Self-pay

## 2020-10-24 ENCOUNTER — Encounter (HOSPITAL_COMMUNITY): Payer: Self-pay | Admitting: Obstetrics and Gynecology

## 2020-10-24 ENCOUNTER — Inpatient Hospital Stay (HOSPITAL_COMMUNITY): Payer: Medicaid Other

## 2020-10-24 ENCOUNTER — Inpatient Hospital Stay (HOSPITAL_COMMUNITY): Payer: Medicaid Other | Admitting: Anesthesiology

## 2020-10-24 DIAGNOSIS — Z3A39 39 weeks gestation of pregnancy: Secondary | ICD-10-CM

## 2020-10-24 DIAGNOSIS — O99824 Streptococcus B carrier state complicating childbirth: Principal | ICD-10-CM | POA: Diagnosis present

## 2020-10-24 DIAGNOSIS — O26893 Other specified pregnancy related conditions, third trimester: Secondary | ICD-10-CM | POA: Diagnosis present

## 2020-10-24 DIAGNOSIS — Z3490 Encounter for supervision of normal pregnancy, unspecified, unspecified trimester: Secondary | ICD-10-CM | POA: Diagnosis present

## 2020-10-24 LAB — CBC
HCT: 40.5 % (ref 36.0–46.0)
Hemoglobin: 13.3 g/dL (ref 12.0–15.0)
MCH: 26 pg (ref 26.0–34.0)
MCHC: 32.8 g/dL (ref 30.0–36.0)
MCV: 79.1 fL — ABNORMAL LOW (ref 80.0–100.0)
Platelets: 221 10*3/uL (ref 150–400)
RBC: 5.12 MIL/uL — ABNORMAL HIGH (ref 3.87–5.11)
RDW: 16.4 % — ABNORMAL HIGH (ref 11.5–15.5)
WBC: 9.2 10*3/uL (ref 4.0–10.5)
nRBC: 0 % (ref 0.0–0.2)

## 2020-10-24 LAB — TYPE AND SCREEN
ABO/RH(D): B POS
Antibody Screen: NEGATIVE

## 2020-10-24 LAB — RPR: RPR Ser Ql: NONREACTIVE

## 2020-10-24 MED ORDER — FENTANYL-BUPIVACAINE-NACL 0.5-0.125-0.9 MG/250ML-% EP SOLN: 12.0000 mL/h | EPIDURAL | Status: DC | PRN

## 2020-10-24 MED ORDER — OXYTOCIN BOLUS FROM INFUSION
333.0000 mL | Freq: Once | INTRAVENOUS | Status: AC
  Administered 2020-10-24: 333 mL via INTRAVENOUS

## 2020-10-24 MED ORDER — PENICILLIN G POT IN DEXTROSE 60000 UNIT/ML IV SOLN: 3.0000 10*6.[IU] | INTRAVENOUS | Status: DC

## 2020-10-24 MED ORDER — ACETAMINOPHEN 325 MG PO TABS: 650.0000 mg | ORAL_TABLET | ORAL | Status: DC | PRN

## 2020-10-24 MED ORDER — SOD CITRATE-CITRIC ACID 500-334 MG/5ML PO SOLN: 30.0000 mL | ORAL | Status: DC | PRN

## 2020-10-24 MED ORDER — LACTATED RINGERS IV SOLN: 500.0000 mL | INTRAVENOUS | Status: DC | PRN

## 2020-10-24 MED ORDER — ONDANSETRON HCL 4 MG PO TABS: 4.0000 mg | ORAL_TABLET | ORAL | Status: DC | PRN

## 2020-10-24 MED ORDER — SIMETHICONE 80 MG PO CHEW: 80.0000 mg | CHEWABLE_TABLET | ORAL | Status: DC | PRN

## 2020-10-24 MED ORDER — METHYLERGONOVINE MALEATE 0.2 MG/ML IJ SOLN: 0.2000 mg | INTRAMUSCULAR | Status: DC | PRN

## 2020-10-24 MED ORDER — FENTANYL-BUPIVACAINE-NACL 0.5-0.125-0.9 MG/250ML-% EP SOLN
EPIDURAL | Status: AC
  Filled 2020-10-24: qty 250

## 2020-10-24 MED ORDER — FAMOTIDINE 20 MG PO TABS: 20.0000 mg | ORAL_TABLET | Freq: Every day | ORAL | Status: DC | PRN

## 2020-10-24 MED ORDER — DIPHENHYDRAMINE HCL 50 MG/ML IJ SOLN: 12.5000 mg | INTRAMUSCULAR | Status: DC | PRN

## 2020-10-24 MED ORDER — SODIUM CHLORIDE 0.9 % IV SOLN: 250.0000 mL | INTRAVENOUS | Status: DC | PRN

## 2020-10-24 MED ORDER — LIDOCAINE HCL (PF) 1 % IJ SOLN: 30.0000 mL | INTRAMUSCULAR | Status: DC | PRN

## 2020-10-24 MED ORDER — METHYLERGONOVINE MALEATE 0.2 MG/ML IJ SOLN
INTRAMUSCULAR | Status: AC
  Filled 2020-10-24: qty 1

## 2020-10-24 MED ORDER — LACTATED RINGERS IV SOLN: 500.0000 mL | Freq: Once | INTRAVENOUS | Status: DC

## 2020-10-24 MED ORDER — IBUPROFEN 800 MG PO TABS
800.0000 mg | ORAL_TABLET | Freq: Three times a day (TID) | ORAL | Status: DC
  Administered 2020-10-24 – 2020-10-26 (×5): 800 mg via ORAL
  Filled 2020-10-24 (×5): qty 1

## 2020-10-24 MED ORDER — TETANUS-DIPHTH-ACELL PERTUSSIS 5-2.5-18.5 LF-MCG/0.5 IM SUSY: 0.5000 mL | PREFILLED_SYRINGE | Freq: Once | INTRAMUSCULAR | Status: DC

## 2020-10-24 MED ORDER — OXYTOCIN-SODIUM CHLORIDE 30-0.9 UT/500ML-% IV SOLN
1.0000 m[IU]/min | INTRAVENOUS | Status: DC
  Administered 2020-10-24: 2 m[IU]/min via INTRAVENOUS
  Filled 2020-10-24: qty 500

## 2020-10-24 MED ORDER — ONDANSETRON HCL 4 MG/2ML IJ SOLN: 4.0000 mg | Freq: Four times a day (QID) | INTRAMUSCULAR | Status: DC | PRN

## 2020-10-24 MED ORDER — OXYCODONE-ACETAMINOPHEN 5-325 MG PO TABS: 2.0000 | ORAL_TABLET | ORAL | Status: DC | PRN

## 2020-10-24 MED ORDER — MAGNESIUM HYDROXIDE 400 MG/5ML PO SUSP: 30.0000 mL | ORAL | Status: DC | PRN

## 2020-10-24 MED ORDER — PHENYLEPHRINE 40 MCG/ML (10ML) SYRINGE FOR IV PUSH (FOR BLOOD PRESSURE SUPPORT)
80.0000 ug | PREFILLED_SYRINGE | INTRAVENOUS | Status: DC | PRN
  Filled 2020-10-24: qty 10

## 2020-10-24 MED ORDER — FERROUS SULFATE 325 (65 FE) MG PO TABS
325.0000 mg | ORAL_TABLET | Freq: Two times a day (BID) | ORAL | Status: DC
  Administered 2020-10-24 – 2020-10-25 (×3): 325 mg via ORAL
  Filled 2020-10-24 (×3): qty 1

## 2020-10-24 MED ORDER — EPHEDRINE 5 MG/ML INJ: 10.0000 mg | INTRAVENOUS | Status: DC | PRN

## 2020-10-24 MED ORDER — METHYLERGONOVINE MALEATE 0.2 MG/ML IJ SOLN
0.2000 mg | INTRAMUSCULAR | Status: DC | PRN
  Administered 2020-10-24: 0.2 mg via INTRAMUSCULAR

## 2020-10-24 MED ORDER — ZOLPIDEM TARTRATE 5 MG PO TABS: 5.0000 mg | ORAL_TABLET | Freq: Every evening | ORAL | Status: DC | PRN

## 2020-10-24 MED ORDER — LACTATED RINGERS IV SOLN: INTRAVENOUS | Status: DC

## 2020-10-24 MED ORDER — METHYLERGONOVINE MALEATE 0.2 MG PO TABS: 0.2000 mg | ORAL_TABLET | ORAL | Status: DC | PRN

## 2020-10-24 MED ORDER — ONDANSETRON HCL 4 MG/2ML IJ SOLN: 4.0000 mg | INTRAMUSCULAR | Status: DC | PRN

## 2020-10-24 MED ORDER — OXYCODONE-ACETAMINOPHEN 5-325 MG PO TABS
1.0000 | ORAL_TABLET | ORAL | Status: DC | PRN
Start: 2020-10-24 — End: 2020-10-24

## 2020-10-24 MED ORDER — OXYTOCIN-SODIUM CHLORIDE 30-0.9 UT/500ML-% IV SOLN
2.5000 [IU]/h | INTRAVENOUS | Status: DC
  Administered 2020-10-24: 2.5 [IU]/h via INTRAVENOUS

## 2020-10-24 MED ORDER — DIBUCAINE (PERIANAL) 1 % EX OINT: 1.0000 "application " | TOPICAL_OINTMENT | CUTANEOUS | Status: DC | PRN

## 2020-10-24 MED ORDER — WITCH HAZEL-GLYCERIN EX PADS: 1.0000 "application " | MEDICATED_PAD | CUTANEOUS | Status: DC | PRN

## 2020-10-24 MED ORDER — ACETAMINOPHEN 325 MG PO TABS
650.0000 mg | ORAL_TABLET | ORAL | Status: DC | PRN
  Administered 2020-10-24: 650 mg via ORAL
  Filled 2020-10-24 (×2): qty 2

## 2020-10-24 MED ORDER — BENZOCAINE-MENTHOL 20-0.5 % EX AERO
1.0000 | INHALATION_SPRAY | CUTANEOUS | Status: DC | PRN
Start: 2020-10-24 — End: 2020-10-26
  Filled 2020-10-24: qty 56

## 2020-10-24 MED ORDER — LIDOCAINE HCL (PF) 1 % IJ SOLN
INTRAMUSCULAR | Status: DC | PRN
  Administered 2020-10-24: 5 mL via EPIDURAL

## 2020-10-24 MED ORDER — SENNOSIDES-DOCUSATE SODIUM 8.6-50 MG PO TABS
2.0000 | ORAL_TABLET | Freq: Every day | ORAL | Status: DC
  Administered 2020-10-25: 2 via ORAL
  Filled 2020-10-24: qty 2

## 2020-10-24 MED ORDER — SODIUM CHLORIDE 0.9% FLUSH: 3.0000 mL | Freq: Two times a day (BID) | INTRAVENOUS | Status: DC

## 2020-10-24 MED ORDER — TERBUTALINE SULFATE 1 MG/ML IJ SOLN: 0.2500 mg | Freq: Once | INTRAMUSCULAR | Status: DC | PRN

## 2020-10-24 MED ORDER — FLEET ENEMA 7-19 GM/118ML RE ENEM: 1.0000 | ENEMA | RECTAL | Status: DC | PRN

## 2020-10-24 MED ORDER — SODIUM CHLORIDE 0.9 % IV SOLN
5.0000 10*6.[IU] | Freq: Once | INTRAVENOUS | Status: AC
  Administered 2020-10-24: 5 10*6.[IU] via INTRAVENOUS
  Filled 2020-10-24: qty 5

## 2020-10-24 MED ORDER — COCONUT OIL OIL: 1.0000 "application " | TOPICAL_OIL | Status: DC | PRN

## 2020-10-24 MED ORDER — FENTANYL-BUPIVACAINE-NACL 0.5-0.125-0.9 MG/250ML-% EP SOLN
EPIDURAL | Status: DC | PRN
  Administered 2020-10-24: 12 mL/h via EPIDURAL

## 2020-10-24 MED ORDER — SODIUM CHLORIDE 0.9% FLUSH: 3.0000 mL | INTRAVENOUS | Status: DC | PRN

## 2020-10-24 MED ORDER — METHYLERGONOVINE MALEATE 0.2 MG PO TABS
0.2000 mg | ORAL_TABLET | ORAL | Status: DC | PRN
Start: 2020-10-24 — End: 2020-10-26

## 2020-10-24 MED ORDER — PHENYLEPHRINE 40 MCG/ML (10ML) SYRINGE FOR IV PUSH (FOR BLOOD PRESSURE SUPPORT): 80.0000 ug | PREFILLED_SYRINGE | INTRAVENOUS | Status: DC | PRN

## 2020-10-24 MED ORDER — MEASLES, MUMPS & RUBELLA VAC IJ SOLR: 0.5000 mL | Freq: Once | INTRAMUSCULAR | Status: DC

## 2020-10-24 MED ORDER — PRENATAL MULTIVITAMIN CH
1.0000 | ORAL_TABLET | Freq: Every day | ORAL | Status: DC
  Administered 2020-10-25: 1 via ORAL
  Filled 2020-10-24: qty 1

## 2020-10-24 MED ORDER — OXYCODONE-ACETAMINOPHEN 5-325 MG PO TABS
2.0000 | ORAL_TABLET | ORAL | Status: DC | PRN
  Administered 2020-10-25 (×2): 2 via ORAL
  Filled 2020-10-24 (×2): qty 2

## 2020-10-24 MED ORDER — DIPHENHYDRAMINE HCL 25 MG PO CAPS: 25.0000 mg | ORAL_CAPSULE | Freq: Four times a day (QID) | ORAL | Status: DC | PRN

## 2020-10-24 NOTE — Lactation Note (Signed)
This note was copied from a baby's chart. Lactation Consultation Note  Patient Name: Dana [name] Date: 10/24/2020 Reason for consult: L&D Initial assessment;Term Age:37 hours  L&D consult with 14 minutes old infant and P4 mother. Parents are present at time of consult. Congratulated them on their newborn. Mother has breastfeed for more than 12 months her older children. Assisted with latch laid back position to left breast. Infant latched successfully. Discussed STS as ideal transition for infants after birth helping with temperature, blood sugar and comfort. Talked about primal reflexes such as rooting, hands to mouth, searching for the breast among others.   Explained LC services availability during postpartum stay. Thanked family for their time.   Maternal Data Has patient been taught Hand Expression?: Yes Does the patient have breastfeeding experience prior to this delivery?: Yes How long did the patient breastfeed?: >12 months x3  Feeding Mother's Current Feeding Choice: Breast Milk  LATCH Score Latch: Grasps breast easily, tongue down, lips flanged, rhythmical sucking.  Audible Swallowing: Spontaneous and intermittent  Type of Nipple: Everted at rest and after stimulation  Comfort (Breast/Nipple): Soft / non-tender  Hold (Positioning): Assistance needed to correctly position infant at breast and maintain latch.  LATCH Score: 9   Interventions Interventions: Breast feeding basics reviewed;Assisted with latch;Skin to skin;Breast massage;Hand express;Expressed milk;Education  Discharge WIC Program: Yes  Consult Status Consult Status: Follow-up Date: 10/24/20 Follow-up type: In-patient    Dana [name] 10/24/2020, 2:24 PM

## 2020-10-24 NOTE — Anesthesia Procedure Notes (Signed)
Epidural Patient location during procedure: OB Start time: 10/24/2020 12:05 PM End time: 10/24/2020 12:20 PM  Staffing Anesthesiologist: Trevor Iha, MD Performed: anesthesiologist   Preanesthetic Checklist Completed: patient identified, IV checked, site marked, risks and benefits discussed, surgical consent, monitors and equipment checked, pre-op evaluation and timeout performed  Epidural Patient position: sitting Prep: DuraPrep and site prepped and draped Patient monitoring: continuous pulse ox and blood pressure Approach: midline Location: L3-L4 Injection technique: LOR air  Needle:  Needle type: Tuohy  Needle gauge: 17 G Needle length: 9 cm and 9 Needle insertion depth: 6 cm Catheter type: closed end flexible Catheter size: 19 Gauge Catheter at skin depth: 12 cm Test dose: negative  Assessment Events: blood not aspirated, injection not painful, no injection resistance, no paresthesia and negative IV test  Additional Notes Patient identified. Risks/Benefits/Options discussed with patient including but not limited to bleeding, infection, nerve damage, paralysis, failed block, incomplete pain control, headache, blood pressure changes, nausea, vomiting, reactions to medication both or allergic, itching and postpartum back pain. Confirmed with bedside nurse the patient's most recent platelet count. Confirmed with patient that they are not currently taking any anticoagulation, have any bleeding history or any family history of bleeding disorders. Patient expressed understanding and wished to proceed. All questions were answered. Sterile technique was used throughout the entire procedure. Please see nursing notes for vital signs. Test dose was given through epidural needle and negative prior to continuing to dose epidural or start infusion. Warning signs of high block given to the patient including shortness of breath, tingling/numbness in hands, complete motor block, or any  concerning symptoms with instructions to call for help. Patient was given instructions on fall risk and not to get out of bed. All questions and concerns addressed with instructions to call with any issues. 1 Attempt (S) . Patient tolerated procedure well.

## 2020-10-24 NOTE — Anesthesia Preprocedure Evaluation (Signed)
Anesthesia Evaluation  Patient identified by MRN, date of birth, ID band Patient awake    Reviewed: Allergy & Precautions, NPO status , Patient's Chart, lab work & pertinent test results  Airway Mallampati: II  TM Distance: >3 FB Neck ROM: Full    Dental no notable dental hx. (+) Teeth Intact, Dental Advisory Given   Pulmonary neg pulmonary ROS,    Pulmonary exam normal breath sounds clear to auscultation       Cardiovascular Exercise Tolerance: Good negative cardio ROS Normal cardiovascular exam Rhythm:Regular Rate:Normal     Neuro/Psych negative neurological ROS     GI/Hepatic GERD  ,  Endo/Other  negative endocrine ROS  Renal/GU negative Renal ROS     Musculoskeletal   Abdominal   Peds  Hematology Lab Results      Component                Value               Date                      WBC                      9.2                 10/24/2020                HGB                      13.3                10/24/2020                HCT                      40.5                10/24/2020                MCV                      79.1 (L)            10/24/2020                PLT                      221                 10/24/2020              Anesthesia Other Findings   Reproductive/Obstetrics (+) Pregnancy                             Anesthesia Physical Anesthesia Plan  ASA: II  Anesthesia Plan: Epidural   Post-op Pain Management:    Induction:   PONV Risk Score and Plan:   Airway Management Planned:   Additional Equipment:   Intra-op Plan:   Post-operative Plan:   Informed Consent: I have reviewed the patients History and Physical, chart, labs and discussed the procedure including the risks, benefits and alternatives for the proposed anesthesia with the patient or authorized representative who has indicated his/her understanding and acceptance.       Plan Discussed with:    Anesthesia Plan Comments: (39.1 wk G6P3)  Anesthesia Quick Evaluation  

## 2020-10-24 NOTE — H&P (Signed)
35 y.o. [redacted]w[redacted]d  [id number] comes in for elective induction at term.  Otherwise has good fetal movement and no bleeding.  Past Medical History:  Diagnosis Date  . Abortion history   . 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 Positive PPD     Past Surgical History:  Procedure Laterality Date  . DILATION AND CURETTAGE OF UTERUS    . DILATION AND EVACUATION N/A 04/14/2013   Procedure: DILATATION AND EVACUATION;  Surgeon: Loney Laurence, MD;  Location: [facility];  Service: Gynecology;  Laterality: N/A;    OB History  Gravida Para Term Preterm AB Living  6 3 2 1 2 3   SAB IAB Ectopic Multiple Live Births  1 1 0 0 3    # Outcome Date GA Lbr Len/2nd Weight Sex Delivery Anes PTL Lv  6 Current           5 Term 05/03/19 [redacted]w[redacted]d 07:50 / 00:23 2917 g M Vag-Vacuum EPI  LIV  4 Term 04/10/15 [redacted]w[redacted]d 15:37 / 00:09 3314 g M Vag-Spont EPI  LIV  3 Preterm 02/12/12 [redacted]w[redacted]d 10:57 / 00:22 1710 g M Vag-Spont EPI  LIV  2 SAB           1 IAB             Social History   Socioeconomic History  . Marital status: Married    Spouse name: Not on file  . Number of children: Not on file  . Years of education: Not on file  . Highest education level: Not on file  Occupational History  . Not on file  Tobacco Use  . Smoking status: Never Smoker  . Smokeless tobacco: Never Used  Vaping Use  . Vaping Use: Never used  Substance and Sexual Activity  . Alcohol use: No  . Drug use: No  . Sexual activity: Not on file  Other Topics Concern  . Not on file  Social History Narrative   Marital status: married x 6 years      Children 1 son (2yo)      Lives: with husband, son, mom, dad.      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    Prenatal Transfer Tool  Maternal Diabetes: No Genetic Screening: Normal Maternal Ultrasounds/Referrals: Normal Fetal Ultrasounds or other Referrals:  None Maternal Substance Abuse:  No Significant Maternal Medications:  Meds include: Other:  multiple treatments for UTI Significant Maternal Lab Results: Group B Strep positive in urine  Other PNC: uncomplicated.    Vitals:   10/24/20 0925  BP: 110/64  Pulse: 89  Resp: 17  Temp: 98 F (36.7 C)  TempSrc: Oral    Lungs/Cor:  NAD Abdomen:  soft, gravid Ex:  no cords, erythema SVE:  3/50/-3 per nurse FHTs:  130, good STV, NST R; Cat 1 tracing. Toco:  q 3-6   A/P   Term elective induction.   GBS POS in urine- PCN.   10/26/20

## 2020-10-25 LAB — CBC
HCT: 37.1 % (ref 36.0–46.0)
Hemoglobin: 12 g/dL (ref 12.0–15.0)
MCH: 26 pg (ref 26.0–34.0)
MCHC: 32.3 g/dL (ref 30.0–36.0)
MCV: 80.5 fL (ref 80.0–100.0)
Platelets: 181 10*3/uL (ref 150–400)
RBC: 4.61 MIL/uL (ref 3.87–5.11)
RDW: 16.4 % — ABNORMAL HIGH (ref 11.5–15.5)
WBC: 12.9 10*3/uL — ABNORMAL HIGH (ref 4.0–10.5)
nRBC: 0 % (ref 0.0–0.2)

## 2020-10-25 NOTE — Progress Notes (Signed)
Post Partum Day 1 Subjective: Dana [name] is doing well this morning. Mild lower abdominal cramping. Ambulating, voiding, tolerating PO. Minimal lochia.   Objective: Patient Vitals for the past 24 hrs:  BP Temp Temp src Pulse Resp SpO2  10/25/20 0500 100/68 98.1 F (36.7 C) -- 84 16 97 %  10/25/20 0030 114/74 98.7 F (37.1 C) -- 98 18 98 %  10/24/20 2030 110/68 98 F (36.7 C) Oral 98 16 --  10/24/20 1641 111/68 98.2 F (36.8 C) Oral 93 18 98 %  10/24/20 1540 119/78 (!) 97.5 F (36.4 C) Oral 83 20 100 %  10/24/20 1500 105/61 97.6 F (36.4 C) Oral 86 -- --  10/24/20 1445 114/65 -- -- 77 -- --  10/24/20 1440 112/75 -- -- 92 -- --  10/24/20 1415 (!) 108/27 -- -- 99 -- --  10/24/20 1400 112/66 -- -- (!) 103 -- --  10/24/20 1305 120/72 -- -- 82 -- 99 %  10/24/20 1300 112/75 -- -- 86 -- 98 %  10/24/20 1235 112/71 -- -- 84 -- 98 %  10/24/20 1230 101/69 -- -- 87 -- 98 %  10/24/20 1225 107/67 -- -- 93 -- 98 %  10/24/20 1220 104/71 -- -- 82 -- 99 %  10/24/20 1214 126/76 -- -- 75 -- 99 %  10/24/20 1036 116/84 -- -- 80 -- --  10/24/20 1035 -- 98.2 F (36.8 C) Oral -- -- --  10/24/20 0925 110/64 98 F (36.7 C) Oral 89 17 --    Physical Exam:  General: alert, cooperative and no distress Lochia: appropriate Uterine Fundus: firm DVT Evaluation: No evidence of DVT seen on physical exam.  Recent Labs    10/24/20 0919 10/25/20 0457  WBC 9.2 12.9*  HGB 13.3 12.0  HCT 40.5 37.1  PLT 221 181    No results for input(s): NA, K, CL, CO2CT, BUN, CREATININE, GLUCOSE, BILITOT, ALT, AST, ALKPHOS, PROT, ALBUMIN in the last 72 hours.  No results for input(s): CALCIUM, MG, PHOS in the last 72 hours.  No results for input(s): PROTIME, APTT, INR in the last 72 hours.  No results for input(s): PROTIME, APTT, INR, FIBRINOGEN in the last 72 hours. Assessment/Plan:  Dana [name] 35 y.o. [id number] PPD#1 sp SVD 1. PPC: continue routine postpartum care 2. Rh pos, rubella immune, s/p tdap and COVID  vaccines given prenatally 3. Dispo: plan for d/c home later today or tomorrow AM pending baby's disposition  LOS: 1 day   Charlett Nose 10/25/2020, 8:01 AM

## 2020-10-25 NOTE — Anesthesia Postprocedure Evaluation (Signed)
Anesthesia Post Note  Patient: Dana [name]  Procedure(s) Performed: AN AD HOC LABOR EPIDURAL     Patient location during evaluation: Mother Baby Anesthesia Type: Epidural Level of consciousness: awake and alert, oriented and patient cooperative Pain management: pain level controlled Vital Signs Assessment: post-procedure vital signs reviewed and stable Respiratory status: spontaneous breathing Cardiovascular status: stable Postop Assessment: no headache, epidural receding, patient able to bend at knees and no signs of nausea or vomiting Anesthetic complications: no Comments: Pt. States she is walking.  Pain score 5.    No complications documented.  Last Vitals:  Vitals:   10/25/20 0030 10/25/20 0500  BP: 114/74 100/68  Pulse: 98 84  Resp: 18 16  Temp: 37.1 C 36.7 C  SpO2: 98% 97%    Last Pain:  Vitals:   10/25/20 0500  TempSrc:   PainSc: 5    Pain Goal:                   [facility]

## 2020-10-26 MED ORDER — IBUPROFEN 600 MG PO TABS: 600.0000 mg | ORAL_TABLET | Freq: Four times a day (QID) | ORAL | 1 refills | Status: DC | PRN

## 2020-10-26 NOTE — Discharge Instructions (Signed)

## 2020-10-26 NOTE — Progress Notes (Signed)
Post Partum Day 2 Subjective: Dana [name] is doing well this morning. Reports bilateral thigh soreness from pushing. Ambulating, voiding, tolerating PO. Minimal lochia. No chest pain, shortness of breath, calf pain.   Objective: Patient Vitals for the past 24 hrs:  BP Temp Temp src Pulse Resp SpO2  10/26/20 0520 99/63 98 F (36.7 C) Oral 90 16 99 %  10/25/20 2040 114/66 98.1 F (36.7 C) Oral 88 18 99 %  10/25/20 1413 105/69 97.8 F (36.6 C) Oral 74 17 96 %    Physical Exam:  General: alert, cooperative and no distress Lochia: appropriate Uterine Fundus: firm DVT Evaluation: No evidence of DVT seen on physical exam.  Recent Labs    10/24/20 0919 10/25/20 0457  WBC 9.2 12.9*  HGB 13.3 12.0  HCT 40.5 37.1  PLT 221 181    No results for input(s): NA, K, CL, CO2CT, BUN, CREATININE, GLUCOSE, BILITOT, ALT, AST, ALKPHOS, PROT, ALBUMIN in the last 72 hours.  No results for input(s): CALCIUM, MG, PHOS in the last 72 hours.  No results for input(s): PROTIME, APTT, INR in the last 72 hours.  No results for input(s): PROTIME, APTT, INR, FIBRINOGEN in the last 72 hours. Assessment/Plan: Dana [name] 35 y.o. [id number] PPD#2 sp SVD 1. PPC: continue routine postpartum care 2. Rh pos, rubella immune, s/p tdap and COVID vaccines given prenatally 3. Dispo: discharge home today, discharge instructions reviewed   LOS: 2 days   Charlett Nose 10/26/2020, 8:17 AM

## 2020-10-26 NOTE — Discharge Summary (Signed)
Postpartum Discharge Summary     Patient Name: Dana [name] DOB: 01/19/1986 MRN: [id number]  Date of admission: 10/24/2020 Delivery date:10/24/2020  Delivering provider: Bobbye Charleston  Date of discharge: 10/26/2020  Admitting diagnosis: Encounter for elective induction of labor [Z34.90] Intrauterine pregnancy: [redacted]w[redacted]d    Secondary diagnosis:  Active Problems:   Encounter for elective induction of labor  Additional problems: none    Discharge diagnosis: Term Pregnancy Delivered                                              Post partum procedures:none Augmentation: Pitocin Complications: None  Hospital course: Induction of Labor With Vaginal Delivery   35y.o. yo [id number] [id number] admitted to the hospital 10/24/2020 for induction of labor.  Indication for induction: Elective.  Patient had an uncomplicated labor course as follows: Membrane Rupture Time/Date: 10:27 AM ,10/24/2020   Delivery Method:Vaginal, Spontaneous  Episiotomy: None  Lacerations:  None  Details of delivery can be found in separate delivery note.  Patient had a routine postpartum course. Patient is discharged home 10/26/20.  Newborn Data: Birth date:10/24/2020  Birth time:1:41 PM  Gender:Female  Living status:Living  Apgars:8 ,9  Weight:3306 g   Magnesium Sulfate received: No BMZ received: No Rhophylac:N/A MMR:N/A T-DaP:Given prenatally Flu: No Transfusion:Yes  Physical exam  Vitals:   10/25/20 0700 10/25/20 1413 10/25/20 2040 10/26/20 0520  BP:  105/69 114/66 99/63  Pulse:  74 88 90  Resp:  '17 18 16  ' Temp:  97.8 F (36.6 C) 98.1 F (36.7 C) 98 F (36.7 C)  TempSrc:  Oral Oral Oral  SpO2:  96% 99% 99%  Weight: 81.6 kg     Height: 5' (1.524 m)      General: alert, cooperative and no distress Lochia: appropriate Uterine Fundus: firm Incision: N/A DVT Evaluation: No evidence of DVT seen on physical exam. Labs: Lab Results  Component Value Date   WBC 12.9 (H) 10/25/2020   HGB 12.0  10/25/2020   HCT 37.1 10/25/2020   MCV 80.5 10/25/2020   PLT 181 10/25/2020   CMP Latest Ref Rng & Units 02/26/2020  Glucose 70 - 99 mg/dL 95  BUN 6 - 20 mg/dL 10  Creatinine 0.44 - 1.00 mg/dL 0.50  Sodium 135 - 145 mmol/L 136  Potassium 3.5 - 5.1 mmol/L 3.3(L)  Chloride 98 - 111 mmol/L 106  CO2 22 - 32 mmol/L 20(L)  Calcium 8.9 - 10.3 mg/dL 8.5(L)  Total Protein 6.5 - 8.1 g/dL 7.5  Total Bilirubin 0.3 - 1.2 mg/dL 0.4  Alkaline Phos 38 - 126 U/L 70  AST 15 - 41 U/L 24  ALT 0 - 44 U/L 27   Edinburgh Score: Edinburgh Postnatal Depression Scale Screening Tool 10/24/2020  I have been able to laugh and see the funny side of things. 0  I have looked forward with enjoyment to things. 0  I have blamed myself unnecessarily when things went wrong. 0  I have been anxious or worried for no good reason. 0  I have felt scared or panicky for no good reason. 0  Things have been getting on top of me. 0  I have been so unhappy that I have had difficulty sleeping. 2  I have felt sad or miserable. 1  I have been so unhappy that I have been crying. 0  The  thought of harming myself has occurred to me. 0  Edinburgh Postnatal Depression Scale Total 3      After visit meds:  Allergies as of 10/26/2020   No Known Allergies     Medication List    STOP taking these medications   hydroxyprogesterone caproate 250 mg/mL Oil injection Commonly known as: MAKENA   ondansetron 4 MG disintegrating tablet Commonly known as: ZOFRAN-ODT     TAKE these medications   acetaminophen 325 MG tablet Commonly known as: TYLENOL Take 325 mg by mouth every 6 (six) hours as needed for pain.   famotidine 20 MG tablet Commonly known as: PEPCID Take 20 mg by mouth as needed for heartburn.   ibuprofen 600 MG tablet Commonly known as: ADVIL Take 1 tablet (600 mg total) by mouth every 6 (six) hours as needed.   prenatal multivitamin Tabs tablet Take 1 tablet by mouth daily at 12 noon.        Discharge  home in stable condition Infant Feeding: Breast Infant Disposition:home with mother Discharge instruction: per After Visit Summary and Postpartum booklet. Activity: Advance as tolerated. Pelvic rest for 6 weeks.  Diet: routine diet Anticipated Birth Control: Unsure Postpartum Appointment:4 weeks Additional Postpartum F/U: none Future Appointments:No future appointments. Follow up Visit:  Follow-up Information    [facility], [facility]. Schedule an appointment as soon as possible for a visit in 4 week(s).   Contact information: [address] [place] [address] [phone]                   10/26/2020 Rowland Lathe, MD

## 2021-08-19 ENCOUNTER — Ambulatory Visit: Payer: Medicaid Other | Admitting: Neurology

## 2021-08-19 ENCOUNTER — Encounter: Payer: Self-pay | Admitting: Neurology

## 2021-08-19 VITALS — BP 120/81 | HR 70 | Ht 60.0 in | Wt 157.0 lb

## 2021-08-19 DIAGNOSIS — M5412 Radiculopathy, cervical region: Secondary | ICD-10-CM | POA: Insufficient documentation

## 2021-08-19 MED ORDER — DULOXETINE HCL 60 MG PO CPEP: 60.0000 mg | ORAL_CAPSULE | Freq: Every day | ORAL | 11 refills | Status: DC

## 2021-08-19 MED ORDER — GABAPENTIN 300 MG PO CAPS: 300.0000 mg | ORAL_CAPSULE | Freq: Three times a day (TID) | ORAL | 11 refills | Status: DC

## 2021-08-19 NOTE — Patient Instructions (Signed)
Meds ordered this encounter  Medications   DULoxetine (CYMBALTA) 60 MG capsule    Sig: Take 1 capsule (60 mg total) by mouth daily.    Dispense:  30 capsule    Refill:  11   gabapentin (NEURONTIN) 300 MG capsule    Sig: Take 1 capsule (300 mg total) by mouth 3 (three) times daily.    Dispense:  90 capsule    Refill:  11

## 2021-08-19 NOTE — Progress Notes (Addendum)
Chief Complaint  Patient presents with   New Patient (Initial Visit)    Rm 15,  NP/Paper/[facility] Dana Mike MD [phone]/cervical radiculopathy, RUE numbness/ C/o cervical pain and numbness in right arm , states PCP started her on Gabapentin 300 mg at bedtime and meloxicam 15mg  daily 2 months ago, reports some relief but pain is still constant        ASSESSMENT AND PLAN  Dana [name] is a 36 y.o. female   Right cervical radicular pain,  Most consistent with right cervical radiculopathy, likely C7,  Proceed with MRI of cervical spine,  EMG nerve conduction study  Higher dose of gabapentin 300 mg 3 times a day,  Add on Cymbalta 60 mg daily  Over the past 6 months she has tried and failed primary care guided home stretching exercise at least twice a week, massage therapy, heating pad, NSAIDs, gabapentin,  With the degree of severe neck pain, likely she will be a candidate for intervention such as epidural injection depend on MRI findings.   DIAGNOSTIC DATA (LABS, IMAGING, TESTING) - I reviewed patient records, labs, notes, testing and imaging myself where available.   MEDICAL HISTORY:  Dana [name] is a 36 year old female, seen in request by primary care doctor Verlon Au, for evaluation of neck pain, radiating pain to right upper extremity, initial evaluation was on August 19, 2021 with her husband  I reviewed and summarized the referring note. PMHX. 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, since 2022, she began to experience intermittent neck pain, gradually getting worse, especially since September 2022, she began to experience radiating pain from right neck to right shoulder, trouble on the dorsum of right arm to right third and fourth finger, symptoms gradually getting worse to the point of difficulty sleeping, she heard a cracking sound when moving her neck  She denies bowel or bladder incontinence,  denies left arm involvement  Primary care started her on gabapentin 300 mg every night, meloxicam 15 mg as needed for more than 6 months, it only helped her symptoms intermittently.  She also performed primary care directed neck stretching exercise at least twice a day from summer of 2022 to now without improvement.  Over the last 6 months, she also tried conservative massage, heating pad, without helping her symptoms.  She now complains of severe pain, difficulty sleeping, performing her job, subjective weakness of her right arm and hand    PHYSICAL EXAM:   Vitals:   08/19/21 0820  BP: 120/81  Pulse: 70  Weight: 157 lb (71.2 kg)  Height: 5' (1.524 m)   Not recorded     Body mass index is 30.66 kg/m.  PHYSICAL EXAMNIATION:  Gen: NAD, conversant, well nourised, well groomed                     Cardiovascular: Regular rate rhythm, no peripheral edema, warm, nontender. Eyes: Conjunctivae clear without exudates or hemorrhage Neck: Supple, no carotid bruits. Pulmonary: Clear to auscultation bilaterally   NEUROLOGICAL EXAM:  MENTAL STATUS: Speech:    Speech is normal; fluent and spontaneous with normal comprehension.  Cognition:     Orientation to time, place and person     Normal recent and remote memory     Normal Attention span and concentration     Normal Language, naming, repeating,spontaneous speech     Fund of knowledge   CRANIAL NERVES: CN II: Visual fields are full  to confrontation. Pupils are round equal and briskly reactive to light. CN III, IV, VI: extraocular movement are normal. No ptosis. CN V: Facial sensation is intact to light touch CN VII: Face is symmetric with normal eye closure  CN VIII: Hearing is normal to causal conversation. CN IX, X: Phonation is normal. CN XI: Head turning and shoulder shrug are intact  MOTOR: There is no pronator drift of out-stretched arms. Muscle bulk and tone are normal. Muscle strength is normal.  REFLEXES: Reflexes  are 2+ and symmetric at the biceps, triceps, knees, and ankles. Plantar responses are flexor.  SENSORY: Intact to light touch, pinprick and vibratory sensation are intact in fingers and toes.  COORDINATION: There is no trunk or limb dysmetria noted.  GAIT/STANCE: Posture is normal. Gait is steady with normal steps, base, arm swing, and turning. Heel and toe walking are normal. Tandem gait is normal.  Romberg is absent.  REVIEW OF SYSTEMS:  Full 14 system review of systems performed and notable only for as above All other review of systems were negative.   ALLERGIES: No Known Allergies  HOME MEDICATIONS: Current Outpatient Medications  Medication Sig Dispense Refill   acetaminophen (TYLENOL) 325 MG tablet Take 325 mg by mouth every 6 (six) hours as needed for pain.     famotidine (PEPCID) 20 MG tablet Take 20 mg by mouth as needed for heartburn.     gabapentin (NEURONTIN) 300 MG capsule TAKE 1 CAPSULE BY MOUTH EVERYDAY AT BEDTIME     meloxicam (MOBIC) 15 MG tablet Take by mouth.     Prenatal Vit-Fe Fumarate-FA (PRENATAL MULTIVITAMIN) TABS tablet Take 1 tablet by mouth daily at 12 noon.     No current facility-administered medications for this visit.    PAST MEDICAL HISTORY: Past Medical History:  Diagnosis Date   Abortion history    Hepatitis B carrier (HCC) 2003   Positive PPD     PAST SURGICAL HISTORY: Past Surgical History:  Procedure Laterality Date   DILATION AND CURETTAGE OF UTERUS     DILATION AND EVACUATION N/A 04/14/2013   Procedure: DILATATION AND EVACUATION;  Surgeon: Loney Laurence, MD;  Location: [facility];  Service: Gynecology;  Laterality: N/A;    FAMILY HISTORY: Family History  Problem Relation Age of Onset   Hypertension Father    Anesthesia problems Neg Hx     SOCIAL HISTORY: Social History   Socioeconomic History   Marital status: Married    Spouse name: Not on file   Number of children: Not on file   Years of education: Not on file    Highest education level: Not on file  Occupational History   Not on file  Tobacco Use   Smoking status: Never   Smokeless tobacco: Never  Vaping Use   Vaping Use: Never used  Substance and Sexual Activity   Alcohol use: No   Drug use: No   Sexual activity: Not on file  Other Topics Concern   Not on file  Social History Narrative   Marital status: married x 6 years      Children 1 son (38yo)      Lives: with husband, son, mom, dad.      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 Ph.D.  [facility] [address], [address] [place], [place] [address] Ph: [phone] Fax: [phone]  CC:  Verlon Au, MD [address] [place],  [place] [address]  Verlon Au, MD

## 2021-08-25 ENCOUNTER — Telehealth: Payer: Self-pay | Admitting: Neurology

## 2021-08-25 NOTE — Telephone Encounter (Signed)
[facility] pending faxed notes.  °

## 2021-08-27 NOTE — Telephone Encounter (Signed)
I called [employer] they informed me a peer to peer/reconsideration can be done 10 business days after the denial and it was denied on 08/26/21. They informed me you just have to call them to do it from 7 AM to 7 PM central time.  The tracking number is [id number] and the phone number is [phone].

## 2021-08-27 NOTE — Telephone Encounter (Signed)
I amended my note, if needed, I can do peer to peer review,

## 2021-08-27 NOTE — Telephone Encounter (Signed)
Noted, I resent the notes to [employer].

## 2021-08-27 NOTE — Telephone Encounter (Signed)
I did Peer to peer review,  The case will be on hold I have amended my note Please resend the note for approval

## 2021-08-27 NOTE — Telephone Encounter (Signed)
[employer] did not approve the MRI Cervical spine.  "The reason for the denial is that they want a doctor's note that say you did six weeks of neck exercises (PT, chiropractic treatments, or medically directed home exercise program) in the last six months. We also need to know the number of visits you went to. If you did this, the notes do not show the dates that you did the exercises. Without this additional information, your request did not meet criteria for approval."  There is an option to do an appeal.

## 2021-09-01 NOTE — Telephone Encounter (Signed)
[facility] Josem KaufmannJS:[id number] (exp. 08/30/21 to 10/29/21) order sent to GI, they will reach out to the patient to schedule.

## 2021-09-10 ENCOUNTER — Other Ambulatory Visit: Payer: Self-pay | Admitting: Neurology

## 2021-09-10 NOTE — Telephone Encounter (Signed)
Rx refilled for 90 day supply. 

## 2021-09-11 ENCOUNTER — Ambulatory Visit
Admission: RE | Admit: 2021-09-11 | Discharge: 2021-09-11 | Disposition: A | Discharge status: Home / Self Care | Payer: Medicaid Other | Source: Ambulatory Visit | Attending: Neurology | Admitting: Neurology

## 2021-09-11 ENCOUNTER — Other Ambulatory Visit: Payer: Self-pay

## 2021-09-11 DIAGNOSIS — M5412 Radiculopathy, cervical region: Secondary | ICD-10-CM

## 2021-09-30 ENCOUNTER — Ambulatory Visit: Payer: Medicaid Other | Admitting: Neurology

## 2021-09-30 ENCOUNTER — Ambulatory Visit (INDEPENDENT_AMBULATORY_CARE_PROVIDER_SITE_OTHER): Payer: Medicaid Other | Admitting: Neurology

## 2021-09-30 DIAGNOSIS — M25511 Pain in right shoulder: Secondary | ICD-10-CM

## 2021-09-30 DIAGNOSIS — G8929 Other chronic pain: Secondary | ICD-10-CM | POA: Insufficient documentation

## 2021-09-30 DIAGNOSIS — M542 Cervicalgia: Secondary | ICD-10-CM

## 2021-09-30 DIAGNOSIS — M25512 Pain in left shoulder: Secondary | ICD-10-CM

## 2021-09-30 DIAGNOSIS — R52 Pain, unspecified: Secondary | ICD-10-CM

## 2021-09-30 DIAGNOSIS — M5412 Radiculopathy, cervical region: Secondary | ICD-10-CM | POA: Diagnosis not present

## 2021-09-30 DIAGNOSIS — R531 Weakness: Secondary | ICD-10-CM | POA: Diagnosis not present

## 2021-09-30 MED ORDER — DULOXETINE HCL 30 MG PO CPEP: 30.0000 mg | ORAL_CAPSULE | Freq: Every day | ORAL | 0 refills | Status: DC

## 2021-09-30 MED ORDER — CYCLOBENZAPRINE HCL 5 MG PO TABS: 5.0000 mg | ORAL_TABLET | Freq: Every evening | ORAL | 1 refills | Status: DC | PRN

## 2021-09-30 MED ORDER — MELOXICAM 7.5 MG PO TABS: 7.5000 mg | ORAL_TABLET | Freq: Every day | ORAL | 1 refills | Status: DC | PRN

## 2021-09-30 NOTE — Procedures (Signed)
? ? ? ?   ?Full Name: Dana [name] Gender: Female ?MRN #: [id number] Date of Birth: [birth year] ?   ?Visit Date: 09/30/2021 14:36 ?Age: 36 Years ?Examining Physician: Marcial Pacas, MD  ?Referring Physician: Marcial Pacas, MD ?History:: 36 year old female complains of neck pain radiating pain to bilateral shoulder ? ?Summary of the test: ?Nerve conduction study: ?Bilateral median, ulnar sensory and motor responses were normal. ? ?Bilateral median and ulnar mixed response showed no significant difference ? ?Electromyography: ?Selected needle examination of bilateral upper extremity and cervical paraspinal muscles were normal. ? ?Conclusion: ?This is a normal study.  There is no electrodiagnostic evidence of bilateral upper extremity neuropathy or bilateral cervical radiculopathy. ? ? ? ?------------------------------- ?Marcial Pacas, M.D. PhD ? ?[facility] ?[address], [address] ?[place], [place] [address] ?Tel: [phone] ?Fax: [phone] ? ?Verbal informed consent was obtained from the patient, patient was informed of potential risk of procedure, including bruising, bleeding, hematoma formation, infection, muscle weakness, muscle pain, numbness, among others. ?   ? ?   ?[place] ?   ?Nerve / Sites Muscle Latency Ref. Amplitude Ref. Rel Amp Segments Distance Velocity Ref. Area  ?  ms ms mV mV %  cm m/s m/s mVms  ?L Median - APB  ?   Wrist APB 3.3 ?4.4 14.1 ?4.0 100 Wrist - APB 7   49.0  ?   Upper arm APB 6.5  12.6  89 Upper arm - Wrist 20 63 ?49 42.6  ?R Median - APB  ?   Wrist APB 3.7 ?4.4 8.1 ?4.0 100 Wrist - APB 7   23.9  ?   Upper arm APB 7.1  7.8  95.5 Upper arm - Wrist 19 56 ?49 23.7  ?L Ulnar - ADM  ?   Wrist ADM 2.4 ?3.3 14.6 ?6.0 100 Wrist - ADM 7   44.9  ?   B.Elbow ADM 4.7  13.0  89.3 B.Elbow - Wrist 16 69 ?49 44.0  ?   A.Elbow ADM 6.3  12.8  98.2 A.Elbow - B.Elbow 10 62 ?49 42.2  ?R Ulnar - ADM  ?   Wrist ADM 2.2 ?3.3 12.8 ?6.0 100 Wrist - ADM 7   42.4  ?   B.Elbow ADM 5.0  13.0  101 B.Elbow - Wrist  17 61 ?49 42.9  ?   A.Elbow ADM 6.6  12.9  99.8 A.Elbow - B.Elbow 10 61 ?49 42.2  ?           ?[place] ?   ?Nerve / Sites Rec. Site Peak Lat Ref.  Amp Ref. Segments Distance Peak Diff Ref.  ?  ms ms ?V ?V  cm ms ms  ?R Median, Ulnar - Transcarpal comparison  ?   Median Palm Wrist 2.2 ?2.2 90 ?35 Median Palm - Wrist 8    ?   Ulnar Palm Wrist 1.8 ?2.2 39 ?12 Ulnar Palm - Wrist 8    ?      Median Palm - Ulnar Palm  0.4 ?0.4  ?L Median - Orthodromic (Dig II, Mid palm)  ?   Dig II Wrist 2.9 ?3.4 22 ?10 Dig II - Wrist 13    ?R Median - Orthodromic (Dig II, Mid palm)  ?   Dig II Wrist 3.1 ?3.4 18 ?10 Dig II - Wrist 13    ?L Ulnar - Orthodromic, (Dig V, Mid palm)  ?   Dig V Wrist 2.3 ?3.1 17 ?5 Dig V - Wrist 11    ?R Ulnar -  Orthodromic, (Dig V, Mid palm)  ?   Dig V Wrist 2.4 ?3.1 15 ?5 Dig V - Wrist 11    ?             ?F  [place] ?   ?Nerve F Lat Ref.  ? ms ms  ?L Ulnar - ADM 21.0 ?32.0  ?R Ulnar - ADM 20.4 ?32.0  ?       ?EMG Summary Table   ? Spontaneous MUAP Recruitment  ?Muscle IA Fib PSW Fasc Other Amp Dur. Poly Pattern  ?R. Pronator teres Normal None None None _______ Normal Normal Normal Normal  ?R. Biceps brachii Normal None None None _______ Normal Normal Normal Normal  ?R. Deltoid Normal None None None _______ Normal Normal Normal Normal  ?R. Triceps brachii Normal None None None _______ Normal Normal Normal Normal  ?R. First dorsal interosseous Normal None None None _______ Normal Normal Normal Normal  ?L. First dorsal interosseous Normal None None None _______ Normal Normal Normal Normal  ?L. Pronator teres Normal None None None _______ Normal Normal Normal Normal  ?L. Biceps brachii Normal None None None _______ Normal Normal Normal Normal  ?L. Deltoid Normal None None None _______ Normal Normal Normal Normal  ?L. Triceps brachii Normal None None None _______ Normal Normal Normal Normal  ?L. Brachioradialis Normal None None None _______ Normal Normal Normal Normal  ?R. Cervical paraspinals Normal None None None  _______ Normal Normal Normal Normal  ?L. Cervical paraspinals Normal None None None _______ Normal Normal Normal Normal  ? ?  ?

## 2021-09-30 NOTE — Progress Notes (Signed)
? ?No chief complaint on file. ? ? ? ? ?ASSESSMENT AND PLAN ? ?Dana [name] is a 36 y.o. female   ?Neck pain, radiating pain to bilateral shoulder ?MRI of cervical, and EMG nerve conduction study showed no significant abnormality to explain her pain, ? Over the past 6 months she has tried and failed primary care guided home stretching exercise at least twice a week, massage therapy, heating pad, NSAIDs, gabapentin, ? Could not tolerate high-dose of Cymbalta, will decrease to 30 mg daily for 1 month, then titrating to 60 mg daily ? Mobic, Flexeril as needed, ? Laboratory evaluation including thyroid functional test, inflammatory markers and CPK ? ? ?DIAGNOSTIC DATA (LABS, IMAGING, TESTING) ?- I reviewed patient records, labs, notes, testing and imaging myself where available. ? ? ?MEDICAL HISTORY: ? ?Dana [name] is a 36 year old female, seen in request by primary care doctor Verlon Au, for evaluation of neck pain, radiating pain to right upper extremity, initial evaluation was on August 19, 2021 with her husband ? ?I reviewed and summarized the referring note. PMHX. ?Hepatitis B ? 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 ?She denies bowel or bladder incontinence, denies left arm involvement ? ?Primary care started her on gabapentin 300 mg every night, meloxicam 15 mg as needed for more than 6 months, it only helped her symptoms intermittently.  She also performed primary care directed neck stretching exercise at least twice a day from summer of 2022 to now without improvement.  Over the  last 6 months, she also tried conservative massage, heating pad, without helping her symptoms. ? ?She now complains of severe pain, difficulty sleeping, performing her job, subjective weakness of her right arm and hand ? ?Update September 30, 2021: ?Patient return for EMG nerve conduction study today, which showed no significant abnormality ? ?We personally reviewed MRI of cervical spine March 2023: mild degenerative changes, there is no evidence of significant canal foraminal narrowing ? ?She continued complaints of diffuse body achy pain, especially neck pain radiating pain to left shoulder more the right shoulder, subjective weakness, tried Cymbalta 60 mg, could not tolerated due to sleepiness, GI side effect ? ?In addition, she complains of diffuse body muscle achy pain, to the point affecting her daily life and the work quality, desire further evaluation, ? ?PHYSICAL EXAM: ?  ?PHYSICAL EXAMNIATION: ? ?Gen: NAD, conversant, well nourised, well groomed      ?NEUROLOGICAL EXAM: ? ?MENTAL STATUS: ?Speech/cognition: ?Awake, alert, oriented to history taking and casual conversation, ?  ?CRANIAL NERVES: ?CN II: Visual fields are full to confrontation. Pupils are round equal and briskly reactive to light. ?CN III, IV, VI: extraocular movement are normal. No ptosis. ?CN V: Facial sensation is intact to light touch ?CN VII: Face is symmetric with normal eye closure  ?CN VIII: Hearing is normal to causal conversation. ?CN IX, X: Phonation is normal. ?CN XI: Head turning and shoulder shrug are intact ? ?MOTOR: Bilateral upper and lower extremity motor strength is normal, mild tenderness at bilateral shoulder joints ? ?REFLEXES: ?Reflexes are 2+ and symmetric at the biceps, triceps, knees, and ankles.  Plantar responses are flexor. ? ?SENSORY: ?Intact to light touch, pinprick and vibratory sensation are intact in fingers and toes. ? ?COORDINATION: ?There is no trunk or limb dysmetria noted. ? ?GAIT/STANCE: ?Posture is normal.  Gait is steady with normal steps, base, arm swing, and turning. Heel and toe walking are normal. Tandem gait is normal.  ?Romberg is absent. ? ?REVIEW OF SYSTEMS:  ?Full 14 system review of systems performed and notable only for as above ?All other review of systems were negative. ? ? ?ALLERGIES: ?No Known Allergies ? ?HOME MEDICATIONS: ?Current Outpatient Medications  ?Medication Sig Dispense Refill  ? DULoxetine (CYMBALTA) 30 MG capsule Take 1 capsule (30 mg total) by mouth daily. 30 capsule 0  ? acetaminophen (TYLENOL) 325 MG tablet Take 325 mg by mouth every 6 (six) hours as needed for pain.    ? DULoxetine (CYMBALTA) 60 MG capsule TAKE 1 CAPSULE BY MOUTH EVERY DAY 90 capsule 4  ? famotidine (PEPCID) 20 MG tablet Take 20 mg by mouth as needed for heartburn.    ? gabapentin (NEURONTIN) 300 MG capsule Take 1 capsule (300 mg total) by mouth 3 (three) times daily. 90 capsule 11  ? meloxicam (MOBIC) 15 MG tablet Take by mouth.    ? Prenatal Vit-Fe Fumarate-FA (PRENATAL MULTIVITAMIN) TABS tablet Take 1 tablet by mouth daily at 12 noon.    ? ?No current facility-administered medications for this visit.  ? ? ?PAST MEDICAL HISTORY: ?Past Medical History:  ?Diagnosis Date  ? Abortion history   ? Hepatitis B carrier [facility]) 2003  ? Positive PPD   ? ? ?PAST SURGICAL HISTORY: ?Past Surgical History:  ?Procedure Laterality Date  ? DILATION AND CURETTAGE OF UTERUS    ? DILATION AND EVACUATION N/A 04/14/2013  ? Procedure: DILATATION AND EVACUATION;  Surgeon: Loney Laurence, MD;  Location: [facility];  Service: Gynecology;  Laterality: N/A;  ? ? ?FAMILY HISTORY: ?Family History  ?Problem Relation Age of Onset  ? Hypertension Father   ? Anesthesia problems Neg Hx   ? ? ?SOCIAL HISTORY: ?Social History  ? ?Socioeconomic History  ? Marital status: Married  ?  Spouse name: Not on file  ? Number of children: Not on file  ? Years of education: Not on file  ? Highest education level: Not on file  ?Occupational History  ? Not on file   ?Tobacco Use  ? Smoking status: Never  ? Smokeless tobacco: Never  ?Vaping Use  ? Vaping Use: Never used  ?Substance and Sexual Activity  ? Alcohol use: No  ? Drug use: No  ? Sexual activity: Not on file  ?Other Topics Concern  ? Not on file  ?Social History Narrative  ? Marital status: married x 6 years  ?    Children 1 son (2yo)  ?    Lives: with husband, son, mom, dad.  ?    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    Tobacco; none  ?     Alcohol: none  ?   ?   ? ?Social Determinants of Health  ? ?Financial Resource Strain: Not on file  ?Food Insecurity: Not on file  ?Transportation Needs: Not on file  ?Physical Activity: Not on file  ?Stress: Not on file  ?Social Connections: Not on file  ?Intimate Partner Violence: Not on file  ? ? ? ? ?Levert Feinstein, M.D. Ph.D. ? ?[facility] ?[address], [address] ?[place], [place] [address] ?Ph: [phone]) [phone] ?Fax: [phone] ? ?  CC:  Verlon AuBoyd, Tammy Lamonica, MD ?[phone] ?[address] ?[place] [id number]  Verlon AuBoyd, Tammy Lamonica, MD   ?

## 2021-10-01 ENCOUNTER — Telehealth: Payer: Self-pay | Admitting: Neurology

## 2021-10-01 LAB — CBC WITH DIFFERENTIAL/PLATELET
Basophils Absolute: 0.1 10*3/uL (ref 0.0–0.2)
Basos: 1 %
EOS (ABSOLUTE): 0.1 10*3/uL (ref 0.0–0.4)
Eos: 2 %
Hematocrit: 44.7 % (ref 34.0–46.6)
Hemoglobin: 14.1 g/dL (ref 11.1–15.9)
Immature Grans (Abs): 0 10*3/uL (ref 0.0–0.1)
Immature Granulocytes: 0 %
Lymphocytes Absolute: 1.8 10*3/uL (ref 0.7–3.1)
Lymphs: 29 %
MCH: 25.5 pg — ABNORMAL LOW (ref 26.6–33.0)
MCHC: 31.5 g/dL (ref 31.5–35.7)
MCV: 81 fL (ref 79–97)
Monocytes Absolute: 0.3 10*3/uL (ref 0.1–0.9)
Monocytes: 5 %
Neutrophils Absolute: 3.9 10*3/uL (ref 1.4–7.0)
Neutrophils: 63 %
Platelets: 344 10*3/uL (ref 150–450)
RBC: 5.54 x10E6/uL — ABNORMAL HIGH (ref 3.77–5.28)
RDW: 13 % (ref 11.7–15.4)
WBC: 6.2 10*3/uL (ref 3.4–10.8)

## 2021-10-01 LAB — COMPREHENSIVE METABOLIC PANEL
ALT: 19 IU/L (ref 0–32)
AST: 16 IU/L (ref 0–40)
Albumin/Globulin Ratio: 2.3 — ABNORMAL HIGH (ref 1.2–2.2)
Albumin: 4.8 g/dL (ref 3.8–4.8)
Alkaline Phosphatase: 112 IU/L (ref 44–121)
BUN/Creatinine Ratio: 14 (ref 9–23)
BUN: 10 mg/dL (ref 6–20)
Bilirubin Total: 0.2 mg/dL (ref 0.0–1.2)
CO2: 25 mmol/L (ref 20–29)
Calcium: 9.1 mg/dL (ref 8.7–10.2)
Chloride: 107 mmol/L — ABNORMAL HIGH (ref 96–106)
Creatinine, Ser: 0.7 mg/dL (ref 0.57–1.00)
Globulin, Total: 2.1 g/dL (ref 1.5–4.5)
Glucose: 67 mg/dL — ABNORMAL LOW (ref 70–99)
Potassium: 4.3 mmol/L (ref 3.5–5.2)
Sodium: 143 mmol/L (ref 134–144)
Total Protein: 6.9 g/dL (ref 6.0–8.5)
eGFR: 113 mL/min/{1.73_m2} (ref >59)

## 2021-10-01 LAB — VITAMIN D 25 HYDROXY (VIT D DEFICIENCY, FRACTURES): Vit D, 25-Hydroxy: 12.9 ng/mL — ABNORMAL LOW (ref 30.0–100.0)

## 2021-10-01 LAB — TSH: TSH: 0.952 u[IU]/mL (ref 0.450–4.500)

## 2021-10-01 LAB — CK: Total CK: 113 U/L (ref 32–182)

## 2021-10-01 LAB — SEDIMENTATION RATE: Sed Rate: 14 mm/hr (ref 0–32)

## 2021-10-01 LAB — C-REACTIVE PROTEIN: CRP: <1 mg/L (ref 0–10)

## 2021-10-01 LAB — ANA W/REFLEX IF POSITIVE: Anti Nuclear Antibody (ANA): NEGATIVE

## 2021-10-01 MED ORDER — VITAMIN D (ERGOCALCIFEROL) 1.25 MG (50000 UNIT) PO CAPS: 50000.0000 [IU] | ORAL_CAPSULE | ORAL | 0 refills | Status: AC

## 2021-10-01 NOTE — Telephone Encounter (Signed)
Left vm for call back

## 2021-10-01 NOTE — Telephone Encounter (Signed)
Please call patient, laboratory evaluation showed significant vitamin D deficiency level was 13, ? ?I have prescribed vitamin D3 supplement, following prescription of weekly for 5-week, she should start over-the-counter vitamin D3 1000 units daily ? ?Rest of the laboratory evaluation showed no significant abnormalities.\\ ? ?Meds ordered this encounter  ?Medications  ? Vitamin D, Ergocalciferol, (DRISDOL) 1.25 MG (50000 UNIT) CAPS capsule  ?  Sig: Take 1 capsule (50,000 Units total) by mouth every 7 (seven) days.  ?  Dispense:  5 capsule  ?  Refill:  0  ?   ?

## 2021-10-02 ENCOUNTER — Encounter: Payer: Self-pay | Admitting: *Deleted

## 2021-10-02 NOTE — Telephone Encounter (Signed)
Left detailed message on both patient and husband's voicemail. Provided our number to call back with any questions. ? ?I will also send her a mychart message.  ?

## 2021-10-15 ENCOUNTER — Encounter: Payer: Medicaid Other | Admitting: Neurology

## 2021-10-22 ENCOUNTER — Other Ambulatory Visit: Payer: Self-pay | Admitting: Neurology

## 2021-12-25 ENCOUNTER — Ambulatory Visit (HOSPITAL_COMMUNITY)
Admission: EM | Admit: 2021-12-25 | Discharge: 2021-12-26 | Disposition: A | Discharge status: Home / Self Care | Payer: No Typology Code available for payment source | Attending: General Surgery | Admitting: General Surgery

## 2021-12-25 ENCOUNTER — Encounter (HOSPITAL_COMMUNITY): Payer: Self-pay

## 2021-12-25 ENCOUNTER — Other Ambulatory Visit: Payer: Self-pay

## 2021-12-25 ENCOUNTER — Emergency Department (HOSPITAL_COMMUNITY): Payer: No Typology Code available for payment source

## 2021-12-25 DIAGNOSIS — K3580 Unspecified acute appendicitis: Secondary | ICD-10-CM | POA: Diagnosis present

## 2021-12-25 LAB — CBC
HCT: 47.4 % — ABNORMAL HIGH (ref 36.0–46.0)
Hemoglobin: 15.3 g/dL — ABNORMAL HIGH (ref 12.0–15.0)
MCH: 26.2 pg (ref 26.0–34.0)
MCHC: 32.3 g/dL (ref 30.0–36.0)
MCV: 81.2 fL (ref 80.0–100.0)
Platelets: 338 10*3/uL (ref 150–400)
RBC: 5.84 MIL/uL — ABNORMAL HIGH (ref 3.87–5.11)
RDW: 14.5 % (ref 11.5–15.5)
WBC: 6.9 10*3/uL (ref 4.0–10.5)
nRBC: 0 % (ref 0.0–0.2)

## 2021-12-25 LAB — URINALYSIS, ROUTINE W REFLEX MICROSCOPIC
Bilirubin Urine: NEGATIVE
Glucose, UA: NEGATIVE mg/dL
Ketones, ur: 20 mg/dL — AB
Nitrite: NEGATIVE
Protein, ur: NEGATIVE mg/dL
Specific Gravity, Urine: 1.027 (ref 1.005–1.030)
pH: 5 (ref 5.0–8.0)

## 2021-12-25 LAB — COMPREHENSIVE METABOLIC PANEL
ALT: 23 U/L (ref 0–44)
AST: 21 U/L (ref 15–41)
Albumin: 4.3 g/dL (ref 3.5–5.0)
Alkaline Phosphatase: 88 U/L (ref 38–126)
Anion gap: 10 (ref 5–15)
BUN: 10 mg/dL (ref 6–20)
CO2: 24 mmol/L (ref 22–32)
Calcium: 9 mg/dL (ref 8.9–10.3)
Chloride: 105 mmol/L (ref 98–111)
Creatinine, Ser: 0.73 mg/dL (ref 0.44–1.00)
GFR, Estimated: >60 mL/min (ref >60)
Glucose, Bld: 143 mg/dL — ABNORMAL HIGH (ref 70–99)
Potassium: 3.8 mmol/L (ref 3.5–5.1)
Sodium: 139 mmol/L (ref 135–145)
Total Bilirubin: 0.8 mg/dL (ref 0.3–1.2)
Total Protein: 7.3 g/dL (ref 6.5–8.1)

## 2021-12-25 LAB — LIPASE, BLOOD: Lipase: 48 U/L (ref 11–51)

## 2021-12-25 LAB — TROPONIN I (HIGH SENSITIVITY)
Troponin I (High Sensitivity): 3 ng/L (ref <18)
Troponin I (High Sensitivity): <2 ng/L (ref <18)

## 2021-12-25 NOTE — ED Triage Notes (Signed)
Pt reports RLQ abdominal pain onset yesterday around 5pm. The pain originated above her umbilicus but overnight radiates to RLQ and umbilicus. She is also reporting left sided chest pain that started a month ago. She was recently diagnosed with UTI and prescribed abx. She also reports bilateral shoulder pain x a few months.

## 2021-12-25 NOTE — ED Provider Triage Note (Signed)
Emergency Medicine Provider Triage Evaluation Note  Dana [name] , a 36 y.o. female  was evaluated in triage.  Pt complains of abdominal pain and chest pain  Review of Systems  Positive: Pain  Negative: fever  Physical Exam  BP 122/82 (BP Location: Right Arm)   Pulse 97   Temp 98.7 F (37.1 C) (Oral)   Resp 17   Ht 5' (1.524 m)   Wt 66.2 kg   SpO2 100%   BMI 28.51 kg/m  Gen:   Awake, no distress   Resp:  Normal effort  MSK:   Moves extremities without difficulty  Other:    Medical Decision Making  Medically screening exam initiated at 4:37 PM.  Appropriate orders placed.  Dana [name] was informed that the remainder of the evaluation will be completed by another provider, this initial triage assessment does not replace that evaluation, and the importance of remaining in the ED until their evaluation is complete.     Elson Areas, [place] 12/25/21 1639

## 2021-12-26 ENCOUNTER — Emergency Department (HOSPITAL_COMMUNITY): Payer: No Typology Code available for payment source

## 2021-12-26 ENCOUNTER — Emergency Department (HOSPITAL_COMMUNITY): Payer: No Typology Code available for payment source | Admitting: Anesthesiology

## 2021-12-26 ENCOUNTER — Other Ambulatory Visit: Payer: Self-pay

## 2021-12-26 ENCOUNTER — Encounter (HOSPITAL_COMMUNITY): Payer: Self-pay

## 2021-12-26 ENCOUNTER — Emergency Department (EMERGENCY_DEPARTMENT_HOSPITAL): Payer: No Typology Code available for payment source | Admitting: Anesthesiology

## 2021-12-26 ENCOUNTER — Encounter (HOSPITAL_COMMUNITY)
Admission: EM | Disposition: A | Discharge status: Home / Self Care | Payer: Self-pay | Source: Home / Self Care | Attending: Emergency Medicine

## 2021-12-26 DIAGNOSIS — K3580 Unspecified acute appendicitis: Secondary | ICD-10-CM

## 2021-12-26 HISTORY — PX: LAPAROSCOPIC APPENDECTOMY: SHX408

## 2021-12-26 LAB — PREGNANCY, URINE: Preg Test, Ur: NEGATIVE

## 2021-12-26 LAB — I-STAT BETA HCG BLOOD, ED (MC, WL, AP ONLY): I-stat hCG, quantitative: <5 m[IU]/mL (ref <5)

## 2021-12-26 SURGERY — APPENDECTOMY, LAPAROSCOPIC
Anesthesia: General | Site: Abdomen

## 2021-12-26 MED ORDER — SODIUM CHLORIDE 0.9 % IR SOLN
Status: DC | PRN
  Administered 2021-12-26: 1000 mL

## 2021-12-26 MED ORDER — PROPOFOL 10 MG/ML IV BOLUS
INTRAVENOUS | Status: DC | PRN
  Administered 2021-12-26: 200 mg via INTRAVENOUS

## 2021-12-26 MED ORDER — OXYCODONE HCL 5 MG/5ML PO SOLN: 5.0000 mg | Freq: Once | ORAL | Status: AC | PRN

## 2021-12-26 MED ORDER — 0.9 % SODIUM CHLORIDE (POUR BTL) OPTIME
TOPICAL | Status: DC | PRN
  Administered 2021-12-26: 1000 mL

## 2021-12-26 MED ORDER — SUGAMMADEX SODIUM 200 MG/2ML IV SOLN
INTRAVENOUS | Status: DC | PRN
  Administered 2021-12-26: 200 mg via INTRAVENOUS

## 2021-12-26 MED ORDER — FENTANYL CITRATE (PF) 250 MCG/5ML IJ SOLN
INTRAMUSCULAR | Status: DC | PRN
  Administered 2021-12-26 (×5): 50 ug via INTRAVENOUS

## 2021-12-26 MED ORDER — BUPIVACAINE-EPINEPHRINE 0.25% -1:200000 IJ SOLN
INTRAMUSCULAR | Status: DC | PRN
  Administered 2021-12-26: 30 mL

## 2021-12-26 MED ORDER — KETOROLAC TROMETHAMINE 30 MG/ML IJ SOLN
INTRAMUSCULAR | Status: AC
  Filled 2021-12-26: qty 1

## 2021-12-26 MED ORDER — METRONIDAZOLE 500 MG/100ML IV SOLN
500.0000 mg | Freq: Once | INTRAVENOUS | Status: DC
  Filled 2021-12-26: qty 100

## 2021-12-26 MED ORDER — MIDAZOLAM HCL 2 MG/2ML IJ SOLN
INTRAMUSCULAR | Status: AC
  Filled 2021-12-26: qty 2

## 2021-12-26 MED ORDER — ONDANSETRON HCL 4 MG/2ML IJ SOLN: 4.0000 mg | INTRAMUSCULAR | Status: DC | PRN

## 2021-12-26 MED ORDER — SODIUM CHLORIDE 0.9 % IV SOLN: INTRAVENOUS | Status: DC

## 2021-12-26 MED ORDER — ACETAMINOPHEN 500 MG PO TABS: 1000.0000 mg | ORAL_TABLET | Freq: Three times a day (TID) | ORAL | 0 refills | Status: AC | PRN

## 2021-12-26 MED ORDER — KETOROLAC TROMETHAMINE 30 MG/ML IJ SOLN
INTRAMUSCULAR | Status: DC | PRN
  Administered 2021-12-26: 30 mg via INTRAVENOUS

## 2021-12-26 MED ORDER — MIDAZOLAM HCL 2 MG/2ML IJ SOLN
INTRAMUSCULAR | Status: DC | PRN
  Administered 2021-12-26: 2 mg via INTRAVENOUS

## 2021-12-26 MED ORDER — LACTATED RINGERS IV SOLN: INTRAVENOUS | Status: DC

## 2021-12-26 MED ORDER — OXYCODONE HCL 5 MG PO TABS: 5.0000 mg | ORAL_TABLET | Freq: Four times a day (QID) | ORAL | 0 refills | Status: AC | PRN

## 2021-12-26 MED ORDER — FENTANYL CITRATE (PF) 100 MCG/2ML IJ SOLN
25.0000 ug | INTRAMUSCULAR | Status: DC | PRN
  Administered 2021-12-26 (×2): 50 ug via INTRAVENOUS

## 2021-12-26 MED ORDER — IOHEXOL 300 MG/ML  SOLN
80.0000 mL | Freq: Once | INTRAMUSCULAR | Status: AC | PRN
  Administered 2021-12-26: 80 mL via INTRAVENOUS

## 2021-12-26 MED ORDER — LIDOCAINE 2% (20 MG/ML) 5 ML SYRINGE
INTRAMUSCULAR | Status: DC | PRN
  Administered 2021-12-26: 80 mg via INTRAVENOUS

## 2021-12-26 MED ORDER — METRONIDAZOLE 500 MG/100ML IV SOLN
500.0000 mg | Freq: Once | INTRAVENOUS | Status: AC
  Administered 2021-12-26: 500 mg via INTRAVENOUS
  Filled 2021-12-26: qty 100

## 2021-12-26 MED ORDER — PHENYLEPHRINE 80 MCG/ML (10ML) SYRINGE FOR IV PUSH (FOR BLOOD PRESSURE SUPPORT)
PREFILLED_SYRINGE | INTRAVENOUS | Status: DC | PRN
  Administered 2021-12-26 (×2): 160 ug via INTRAVENOUS

## 2021-12-26 MED ORDER — BUPIVACAINE-EPINEPHRINE (PF) 0.25% -1:200000 IJ SOLN
INTRAMUSCULAR | Status: AC
  Filled 2021-12-26: qty 30

## 2021-12-26 MED ORDER — ONDANSETRON HCL 4 MG/2ML IJ SOLN
4.0000 mg | Freq: Once | INTRAMUSCULAR | Status: AC
  Administered 2021-12-26: 4 mg via INTRAVENOUS
  Filled 2021-12-26: qty 2

## 2021-12-26 MED ORDER — CHLORHEXIDINE GLUCONATE 0.12 % MT SOLN
15.0000 mL | Freq: Once | OROMUCOSAL | Status: AC
  Administered 2021-12-26: 15 mL via OROMUCOSAL

## 2021-12-26 MED ORDER — ORAL CARE MOUTH RINSE: 15.0000 mL | Freq: Once | OROMUCOSAL | Status: AC

## 2021-12-26 MED ORDER — ROCURONIUM BROMIDE 10 MG/ML (PF) SYRINGE
PREFILLED_SYRINGE | INTRAVENOUS | Status: DC | PRN
  Administered 2021-12-26: 50 mg via INTRAVENOUS

## 2021-12-26 MED ORDER — MORPHINE SULFATE (PF) 2 MG/ML IV SOLN: 1.0000 mg | INTRAVENOUS | Status: DC | PRN

## 2021-12-26 MED ORDER — DEXAMETHASONE SODIUM PHOSPHATE 10 MG/ML IJ SOLN
INTRAMUSCULAR | Status: DC | PRN
  Administered 2021-12-26: 10 mg via INTRAVENOUS

## 2021-12-26 MED ORDER — OXYCODONE HCL 5 MG PO TABS
5.0000 mg | ORAL_TABLET | Freq: Once | ORAL | Status: AC | PRN
  Administered 2021-12-26: 5 mg via ORAL

## 2021-12-26 MED ORDER — ONDANSETRON HCL 4 MG/2ML IJ SOLN
INTRAMUSCULAR | Status: DC | PRN
  Administered 2021-12-26: 4 mg via INTRAVENOUS

## 2021-12-26 MED ORDER — SODIUM CHLORIDE 0.9 % IV SOLN
2.0000 g | Freq: Once | INTRAVENOUS | Status: AC
  Administered 2021-12-26: 2 g via INTRAVENOUS
  Filled 2021-12-26: qty 20

## 2021-12-26 MED ORDER — SODIUM CHLORIDE 0.9 % IV SOLN
2.0000 g | Freq: Once | INTRAVENOUS | Status: DC
  Filled 2021-12-26: qty 20

## 2021-12-26 MED ORDER — SODIUM CHLORIDE 0.9 % IV BOLUS
1000.0000 mL | Freq: Once | INTRAVENOUS | Status: AC
  Administered 2021-12-26: 1000 mL via INTRAVENOUS

## 2021-12-26 MED ORDER — FENTANYL CITRATE (PF) 100 MCG/2ML IJ SOLN
INTRAMUSCULAR | Status: AC
  Filled 2021-12-26: qty 2

## 2021-12-26 MED ORDER — OXYCODONE HCL 5 MG PO TABS
ORAL_TABLET | ORAL | Status: AC
  Filled 2021-12-26: qty 1

## 2021-12-26 MED ORDER — FENTANYL CITRATE PF 50 MCG/ML IJ SOSY
50.0000 ug | PREFILLED_SYRINGE | Freq: Once | INTRAMUSCULAR | Status: AC
  Administered 2021-12-26: 50 ug via INTRAVENOUS
  Filled 2021-12-26: qty 1

## 2021-12-26 MED ORDER — AMISULPRIDE (ANTIEMETIC) 5 MG/2ML IV SOLN: 10.0000 mg | Freq: Once | INTRAVENOUS | Status: DC | PRN

## 2021-12-26 MED ORDER — ONDANSETRON HCL 4 MG/2ML IJ SOLN: 4.0000 mg | Freq: Once | INTRAMUSCULAR | Status: DC | PRN

## 2021-12-26 MED ORDER — PROPOFOL 10 MG/ML IV BOLUS
INTRAVENOUS | Status: AC
  Filled 2021-12-26: qty 20

## 2021-12-26 MED ORDER — FENTANYL CITRATE (PF) 250 MCG/5ML IJ SOLN
INTRAMUSCULAR | Status: AC
  Filled 2021-12-26: qty 5

## 2021-12-26 SURGICAL SUPPLY — 60 items
ADH SKN CLS APL DERMABOND .7 (GAUZE/BANDAGES/DRESSINGS) ×1
APL PRP STRL LF DISP 70% ISPRP (MISCELLANEOUS) ×1
APL SWBSTK 6 STRL LF DISP (MISCELLANEOUS) ×2
APPLICATOR COTTON TIP 6 STRL (MISCELLANEOUS) ×4 IMPLANT
APPLICATOR COTTON TIP 6IN STRL (MISCELLANEOUS) ×4 IMPLANT
APPLIER CLIP ROT 10 11.4 M/L (STAPLE)
APR CLP MED LRG 11.4X10 (STAPLE)
BAG COUNTER SPONGE SURGICOUNT (BAG) ×3 IMPLANT
BAG SPEC RTRVL 10 TROC 200 (ENDOMECHANICALS)
BAG SPNG CNTER NS LX DISP (BAG) ×1
BLADE CLIPPER SURG (BLADE) IMPLANT
CANISTER SUCT 3000ML PPV (MISCELLANEOUS) ×3 IMPLANT
CHLORAPREP W/TINT 26 (MISCELLANEOUS) ×3 IMPLANT
CLIP APPLIE ROT 10 11.4 M/L (STAPLE) IMPLANT
COVER SURGICAL LIGHT HANDLE (MISCELLANEOUS) ×3 IMPLANT
CUTTER FLEX LINEAR 45M (STAPLE) ×3 IMPLANT
DERMABOND ADVANCED (GAUZE/BANDAGES/DRESSINGS) ×1
DERMABOND ADVANCED .7 DNX12 (GAUZE/BANDAGES/DRESSINGS) IMPLANT
DRSG TEGADERM 4X4.75 (GAUZE/BANDAGES/DRESSINGS) IMPLANT
ELECT REM PT RETURN 9FT ADLT (ELECTROSURGICAL) ×2
ELECTRODE REM PT RTRN 9FT ADLT (ELECTROSURGICAL) ×2 IMPLANT
ENDOLOOP SUT PDS II  0 18 (SUTURE)
ENDOLOOP SUT PDS II 0 18 (SUTURE) IMPLANT
GAUZE SPONGE 2X2 8PLY STRL LF (GAUZE/BANDAGES/DRESSINGS) IMPLANT
GLOVE BIOGEL M STRL SZ7.5 (GLOVE) ×3 IMPLANT
GLOVE INDICATOR 8.0 STRL GRN (GLOVE) ×6 IMPLANT
GOWN STRL REUS W/ TWL LRG LVL3 (GOWN DISPOSABLE) ×4 IMPLANT
GOWN STRL REUS W/TWL 2XL LVL3 (GOWN DISPOSABLE) ×3 IMPLANT
GOWN STRL REUS W/TWL LRG LVL3 (GOWN DISPOSABLE) ×4
GRASPER SUT TROCAR 14GX15 (MISCELLANEOUS) IMPLANT
KIT BASIN OR (CUSTOM PROCEDURE TRAY) ×3 IMPLANT
KIT TURNOVER KIT B (KITS) ×3 IMPLANT
NS IRRIG 1000ML POUR BTL (IV SOLUTION) ×3 IMPLANT
PAD ARMBOARD 7.5X6 YLW CONV (MISCELLANEOUS) ×6 IMPLANT
POUCH RETRIEVAL ECOSAC 10 (ENDOMECHANICALS) IMPLANT
POUCH RETRIEVAL ECOSAC 10MM (ENDOMECHANICALS)
RELOAD 45 VASCULAR/THIN (ENDOMECHANICALS) ×2 IMPLANT
RELOAD STAPLE 45 2.5 WHT GRN (ENDOMECHANICALS) IMPLANT
RELOAD STAPLE 45 3.5 BLU ETS (ENDOMECHANICALS) IMPLANT
RELOAD STAPLE TA45 3.5 REG BLU (ENDOMECHANICALS) IMPLANT
SCISSORS LAP 5X35 DISP (ENDOMECHANICALS) IMPLANT
SET IRRIG TUBING LAPAROSCOPIC (IRRIGATION / IRRIGATOR) ×3 IMPLANT
SET TUBE SMOKE EVAC HIGH FLOW (TUBING) ×3 IMPLANT
SHEARS HARMONIC ACE PLUS 36CM (ENDOMECHANICALS) ×3 IMPLANT
SLEEVE ENDOPATH XCEL 5M (ENDOMECHANICALS) ×3 IMPLANT
SLEEVE Z-THREAD 5X100MM (TROCAR) ×1 IMPLANT
SPONGE GAUZE 2X2 STER 10/PKG (GAUZE/BANDAGES/DRESSINGS)
STRIP CLOSURE SKIN 1/2X4 (GAUZE/BANDAGES/DRESSINGS) IMPLANT
SUT MNCRL AB 4-0 PS2 18 (SUTURE) ×3 IMPLANT
SUT VICRYL 0 UR6 27IN ABS (SUTURE) ×1 IMPLANT
TOWEL GREEN STERILE (TOWEL DISPOSABLE) ×3 IMPLANT
TOWEL GREEN STERILE FF (TOWEL DISPOSABLE) ×3 IMPLANT
TRAY FOLEY W/BAG SLVR 16FR (SET/KITS/TRAYS/PACK)
TRAY FOLEY W/BAG SLVR 16FR ST (SET/KITS/TRAYS/PACK) IMPLANT
TRAY LAPAROSCOPIC MC (CUSTOM PROCEDURE TRAY) ×3 IMPLANT
TROCAR BALLN 12MMX100 BLUNT (TROCAR) ×1 IMPLANT
TROCAR XCEL BLUNT TIP 100MML (ENDOMECHANICALS) ×3 IMPLANT
TROCAR XCEL NON-BLD 5MMX100MML (ENDOMECHANICALS) ×3 IMPLANT
TROCAR Z-THREAD OPTICAL 5X100M (TROCAR) ×1 IMPLANT
WATER STERILE IRR 1000ML POUR (IV SOLUTION) ×3 IMPLANT

## 2021-12-26 NOTE — Progress Notes (Signed)
I asked the pt preoperatively if she wanted me to call or speak with anyone postoperatively about her surgery and/or dc instructions and she declined  Mary Sella. Andrey Campanile, MD, FACS General, Bariatric, & Minimally Invasive Surgery [facility], [place]

## 2021-12-26 NOTE — ED Provider Notes (Cosign Needed Addendum)
  Physical Exam  BP 108/83   Pulse 69   Temp 98.2 F (36.8 C) (Oral)   Resp 18   Ht 5' (1.524 m)   Wt 66.2 kg   SpO2 99%   BMI 28.51 kg/m   Physical Exam Vitals and nursing note reviewed.  Constitutional:      Appearance: She is well-developed.  HENT:     Head: Normocephalic.  Cardiovascular:     Rate and Rhythm: Normal rate.  Pulmonary:     Effort: Pulmonary effort is normal.  Abdominal:     General: Abdomen is flat. Bowel sounds are normal.  Skin:    General: Skin is warm and dry.  Neurological:     Mental Status: She is alert.     Procedures  Procedures  ED Course / MDM   Clinical Course as of 12/26/21 0913  Fri Dec 26, 2021  7782 CT scan with mildly enlarged appendix without surrounding inflammatory changes.  Discussed with Dr. Dwain Sarna who favors that this is likely not appendicitis at this time given lack of white count and meaningful CT changes despite being on day 4 of symptoms at this time.  Recommends proceeding with pelvic ultrasound.  If pelvic ultrasound is normal they reconsult surgery and they will be willing to evaluate her at the bedside.  I appreciate his collaboration in care of this patient. [RS]    Clinical Course User Index [RS] Sponseller, Eugene Gavia, PA-C   Medical Decision Making Amount and/or Complexity of Data Reviewed Labs: ordered. Radiology: ordered.  Risk Prescription drug management.   Patient care assumed from [place] S. PA-C at shift change, please see her note for a full HPI. Briefly, patient here with abdominal pain along with focalization to the RLQ, general surgery consulted who requested Pelvis US. Afebrile, no white count, requiring fentanyl for pain control. Plan is for follow up with Pelvic US and general surgery for maybe appendectomy.   Ultrasound findings: IMPRESSION: No acute sonographic findings. IUD is in place in expected positioning within the cavity with trace fluid in the uterine cavity. No evidence of  ovarian torsion or mass. No visible fibroids.  9:12 AM patient evaluated and seen by me, she is under some discomfort now and she is aware that she will be having surgical intervention by general surgery in approximately 45 minutes.  Vitals are within normal limits, she is hemodynamically stable for further management.   Portions of this note were generated with Scientist, clinical (histocompatibility and immunogenetics). Dictation errors may occur despite best attempts at proofreading.         Claude Manges, PA-C 12/26/21 0913    Claude Manges, PA-C 12/26/21 0913    Melene Plan, DO 12/29/21 [phone]

## 2021-12-26 NOTE — Anesthesia Procedure Notes (Signed)
Procedure Name: Intubation Date/Time: 12/26/2021 11:28 AM  Performed by: Lavell Luster, CRNAPre-anesthesia Checklist: Patient identified, Emergency Drugs available, Suction available, Patient being monitored and Timeout performed Patient Re-evaluated:Patient Re-evaluated prior to induction Oxygen Delivery Method: Circle system utilized Preoxygenation: Pre-oxygenation with 100% oxygen Induction Type: IV induction Ventilation: Mask ventilation without difficulty Laryngoscope Size: Mac and 3 Grade View: Grade I Tube type: Oral Tube size: 7.0 mm Number of attempts: 1 Airway Equipment and Method: Stylet Placement Confirmation: ETT inserted through vocal cords under direct vision, positive ETCO2 and breath sounds checked- equal and bilateral Secured at: 19 cm Tube secured with: Tape Dental Injury: Teeth and Oropharynx as per pre-operative assessment

## 2021-12-26 NOTE — Op Note (Signed)
Dana [name] [id number] 01/08/1986 12/26/2021  Appendectomy, Lap, & laparoscopic bilateral TAP block Procedure Note  Indications: The patient presented with a history of right-sided abdominal pain. A CT revealed findings consistent with early acute appendicitis.  Pre-operative Diagnosis: acute appendicitis  Post-operative Diagnosis: Same  Surgeon: Gaynelle Adu MD FACS  Assistants: Berenda Morale RNFA  Anesthesia: General endotracheal anesthesia  Procedure Details  The patient was seen again in the Holding Room. The risks, benefits, complications, treatment options, and expected outcomes were discussed with the patient and/or family. The possibilities of perforation of viscus, bleeding, recurrent infection, the need for additional procedures, failure to diagnose a condition, and creating a complication requiring transfusion or operation were discussed. There was concurrence with the proposed plan and informed consent was obtained. The site of surgery was properly noted. The patient was taken to Operating Room, identified as Dana [name] and the procedure verified as Appendectomy. A Time Out was held and the above information confirmed.  The patient was placed in the supine position and general anesthesia was induced, along with placement of orogastric tube, SCDs. The patient voided prior to surgery. The abdomen was prepped and draped in a sterile fashion. A 1.5 centimeter infraumbilical incision was made.  The umbilical stalk was elevated, and the midline fascia was incised with a #11 blade.  A Kelly clamp was used to confirm entrance into the peritoneal cavity.  A pursestring suture was passed around the incision with a 0 Vicryl.  A 31mm Hasson was introduced into the abdomen and the tails of the suture were used to hold the Hasson in place.   The pneumoperitoneum was then established to steady pressure of 15 mmHg.  Additional 5 mm cannulas then placed in the left lower quadrant of the abdomen  and the suprapubic region under direct visualization. A careful evaluation of the entire abdomen was carried out. The patient was placed in Trendelenburg and left lateral decubitus position. The small intestines were retracted in the cephalad and left lateral direction away from the pelvis and right lower quadrant. The patient was found to have an inflammed appendix that was extending into the pelvis. There was no evidence of perforation. Gross inspection of uterus, tubes and ovaries appeared normal. No purulence in pelvis  The appendix was carefully dissected. The appendix was was skeletonized with the harmonic scalpel.   The appendix was divided at its base using an endo-GIA stapler with a white load. No appendiceal stump was left in place. The appendix was removed from the abdomen with an Ecco bag through the umbilical port.  There was no evidence of bleeding, leakage, or complication after division of the appendix. Irrigation was also performed and irrigate suctioned from the abdomen as well.  The umbilical port site was closed with the purse string suture. Two additional interrupted 0 vicryl sutures were placed at the umbilical fascia using PMI suture passer with lap guidance.  The closure was viewed laparoscopically. There was no residual palpable fascial defect.  A bilateral laparoscopic tap block was performed using local for postoperative pain relief. The trocar site skin wounds were closed with 4-0 Monocryl. Dermabond was applied to the skin incisions.  Instrument, sponge, and needle counts were correct at the conclusion of the case.   Findings: The appendix was found to be inflamed. There were not signs of necrosis.  There was not perforation. There was not abscess formation.  Estimated Blood Loss:  Minimal         Drains: none  Specimens: appendix         Complications:  None; patient tolerated the procedure well.         Disposition: PACU - hemodynamically stable.          Condition: stable  Mary Sella. Andrey Campanile, MD, FACS General, Bariatric, & Minimally Invasive Surgery [facility], [place]

## 2021-12-26 NOTE — ED Provider Notes (Signed)
[facility] EMERGENCY DEPARTMENT Provider Note   CSN: [id number] Arrival date & time: 12/25/21  1606     History  Chief Complaint  Patient presents with   Abdominal Pain    Dana [name] is a 36 y.o. female who presents with 2 concerns.  She endorses central burning type chest pain intermittently for the last month but states that 2 days ago (today is day 3) she began to develop upper abdominal pain/pain around her umbilicus.  Today her pain is much more severe in her right lower belly and radiates to the left side.  Endorses some nausea but no vomiting, no diarrhea, last bowel movement day before yesterday.  Recently diagnosed with urinary tract infection currently on antibiotics.  I personally viewed the patient's medical records which is history of chronic hepatitis B infection, GERD without esophagitis, not anticoagulated.  HPI     Home Medications Prior to Admission medications   Medication Sig Start Date End Date Taking? Authorizing Provider  acetaminophen (TYLENOL) 325 MG tablet Take 325 mg by mouth every 6 (six) hours as needed for pain.    [provider]  cyclobenzaprine (FLEXERIL) 5 MG tablet Take 1 tablet (5 mg total) by mouth at bedtime as needed for muscle spasms. 09/30/21   Levert Feinstein, MD  DULoxetine (CYMBALTA) 30 MG capsule Take 1 capsule (30 mg total) by mouth daily. 09/30/21   Levert Feinstein, MD  DULoxetine (CYMBALTA) 60 MG capsule TAKE 1 CAPSULE BY MOUTH EVERY DAY 09/10/21   Levert Feinstein, MD  famotidine (PEPCID) 20 MG tablet Take 20 mg by mouth as needed for heartburn. 09/12/18   [provider]  gabapentin (NEURONTIN) 300 MG capsule Take 1 capsule (300 mg total) by mouth 3 (three) times daily. 08/19/21   Levert Feinstein, MD  meloxicam (MOBIC) 7.5 MG tablet Take 1 tablet (7.5 mg total) by mouth daily as needed for pain. 09/30/21   Levert Feinstein, MD  Prenatal Vit-Fe Fumarate-FA (PRENATAL MULTIVITAMIN) TABS tablet Take 1 tablet by mouth daily at 12 noon.     [provider]  Vitamin D, Ergocalciferol, (DRISDOL) 1.25 MG (50000 UNIT) CAPS capsule Take 1 capsule (50,000 Units total) by mouth every 7 (seven) days. 10/01/21   Levert Feinstein, MD      Allergies    Patient has no known allergies.    Review of Systems   Review of Systems  Constitutional:  Positive for activity change, appetite change, chills and fatigue. Negative for fever.  HENT: Negative.    Eyes: Negative.   Cardiovascular:  Positive for chest pain.  Gastrointestinal:  Positive for abdominal pain and nausea. Negative for blood in stool, constipation, diarrhea and vomiting.  Genitourinary:  Positive for urgency. Negative for decreased urine volume, vaginal bleeding, vaginal discharge and vaginal pain.  Musculoskeletal: Negative.   Skin: Negative.   Neurological: Negative.     Physical Exam Updated Vital Signs BP 102/79   Pulse 75   Temp 98.2 F (36.8 C) (Oral)   Resp 14   Ht 5' (1.524 m)   Wt 66.2 kg   SpO2 100%   BMI 28.51 kg/m  Physical Exam Vitals and nursing note reviewed.  Constitutional:      Appearance: She is not toxic-appearing.  HENT:     Head: Normocephalic and atraumatic.     Mouth/Throat:     Mouth: Mucous membranes are moist.     Pharynx: No oropharyngeal exudate or posterior oropharyngeal erythema.  Eyes:     General:  Right eye: No discharge.        Left eye: No discharge.     Conjunctiva/sclera: Conjunctivae normal.  Cardiovascular:     Rate and Rhythm: Normal rate and regular rhythm.     Pulses: Normal pulses.     Heart sounds: Normal heart sounds. No murmur heard. Pulmonary:     Effort: Pulmonary effort is normal. No respiratory distress.     Breath sounds: Normal breath sounds. No wheezing or rales.  Abdominal:     General: Bowel sounds are normal. There is no distension.     Palpations: Abdomen is soft.     Tenderness: There is abdominal tenderness in the right lower quadrant and periumbilical area. There is guarding.  There is no right CVA tenderness, left CVA tenderness or rebound. Positive signs include Rovsing's sign, McBurney's sign and obturator sign. Negative signs include Murphy's sign.  Musculoskeletal:        General: No deformity.     Cervical back: Neck supple.  Skin:    General: Skin is warm and dry.     Capillary Refill: Capillary refill takes less than 2 seconds.  Neurological:     General: No focal deficit present.     Mental Status: She is alert and oriented to person, place, and time. Mental status is at baseline.  Psychiatric:        Mood and Affect: Mood normal.     ED Results / Procedures / Treatments   Labs (all labs ordered are listed, but only abnormal results are displayed) Labs Reviewed  COMPREHENSIVE METABOLIC PANEL - Abnormal; Notable for the following components:      Result Value   Glucose, Bld 143 (*)    All other components within normal limits  CBC - Abnormal; Notable for the following components:   RBC 5.84 (*)    Hemoglobin 15.3 (*)    HCT 47.4 (*)    All other components within normal limits  URINALYSIS, ROUTINE W REFLEX MICROSCOPIC - Abnormal; Notable for the following components:   APPearance HAZY (*)    Hgb urine dipstick MODERATE (*)    Ketones, ur 20 (*)    Leukocytes,Ua TRACE (*)    Bacteria, UA RARE (*)    All other components within normal limits  LIPASE, BLOOD  PREGNANCY, URINE  I-STAT BETA HCG BLOOD, ED ([facility], [facility], [facility] ONLY)  I-STAT BETA HCG BLOOD, ED ([facility], [facility], [facility] ONLY)  TROPONIN I (HIGH SENSITIVITY)  TROPONIN I (HIGH SENSITIVITY)    EKG EKG Interpretation  Date/Time:  Friday December 26 2021 01:38:41 EDT Ventricular Rate:  80 PR Interval:  171 QRS Duration: 111 QT Interval:  405 QTC Calculation: 468 R Axis:   47 Text Interpretation: Sinus rhythm Abnormal inferior Q waves Borderline T wave abnormalities No significant change since last tracing Confirmed by Melene Plan [phone] on 12/26/2021 1:40:36 AM  Radiology CT ABDOMEN PELVIS W  CONTRAST  Result Date: 12/26/2021 CLINICAL DATA:  Right lower quadrant pain and nausea. EXAM: CT ABDOMEN AND PELVIS WITH CONTRAST TECHNIQUE: Multidetector CT imaging of the abdomen and pelvis was performed using the standard protocol following bolus administration of intravenous contrast. RADIATION DOSE REDUCTION: This exam was performed according to the departmental dose-optimization program which includes automated exposure control, adjustment of the mA and/or kV according to patient size and/or use of iterative reconstruction technique. CONTRAST:  23mL OMNIPAQUE IOHEXOL 300 MG/ML  SOLN COMPARISON:  None Available. FINDINGS: Lower chest: No acute abnormality. Mild passive atelectasis in the posterior bases.  Mild elevation right hemidiaphragm. Hepatobiliary: No hepatic mass enhancement. No focal abnormality of the gallbladder wall, lumen and bile ducts. Pancreas: No focal abnormality or ductal dilatation. Spleen: No focal abnormality or splenomegaly. Adrenals/Urinary Tract: There is no adrenal or renal cortical mass enhancement. There is contrast in both renal collecting systems which could obscure intrarenal stones if present. No ureteral stones or hydronephrosis are seen. The bladder is unremarkable. Stomach/Bowel: Unremarkable stomach, unremarkable unopacified small bowel. The appendix is enhancing and minimally prominent measuring 7 mm but there are no inflammatory changes. Nevertheless acute appendicitis could not be excluded if the patient had peritoneal signs or localizing tenderness. There is moderate stool retention. There is no evidence of focal colitis or diverticulitis. Vascular/Lymphatic: No significant vascular findings are present. No enlarged abdominal or pelvic lymph nodes. Reproductive: The uterus is intact and retroverted. There is an IUD in the expected location within the cavity. The ovaries are follicular but not enlarged. Other: Small umbilical fat hernia. There is no free air, hemorrhage  or fluid or incarcerated hernia. Musculoskeletal: No acute or significant osseous findings. At L4-5 however, there is a left paracentral disc bulge causing mild spinal canal stenosis and possible mass effect on the left L5 nerve root. Please correlate clinically. IMPRESSION: 1. Equivocal findings for appendicitis. The appendix is enhancing and slightly prominent but there are no inflammatory changes around it. Clinical correlation advised. 2. Constipation and diverticulosis. 3. IUD. 4. Small umbilical fat hernia. 5. Broad left paracentral disc bulge L4-5 level with possible mass effect on the L5 nerve root. Electronically Signed   By: Almira Bar M.D.   On: 12/26/2021 04:45   DG Chest 2 View  Result Date: 12/25/2021 CLINICAL DATA:  Chest pain. EXAM: CHEST - 2 VIEW COMPARISON:  Chest x-ray 09/04/2020 FINDINGS: The heart size and mediastinal contours are within normal limits. Both lungs are clear. The visualized skeletal structures are unremarkable. IMPRESSION: No active cardiopulmonary disease. Electronically Signed   By: Darliss Cheney M.D.   On: 12/25/2021 16:48    Procedures Procedures    Medications Ordered in ED Medications  sodium chloride 0.9 % bolus 1,000 mL (0 mLs Intravenous Stopped 12/26/21 0513)  ondansetron (ZOFRAN) injection 4 mg (4 mg Intravenous Given 12/26/21 0321)  fentaNYL (SUBLIMAZE) injection 50 mcg (50 mcg Intravenous Given 12/26/21 0321)  iohexol (OMNIPAQUE) 300 MG/ML solution 80 mL (80 mLs Intravenous Contrast Given 12/26/21 0405)    ED Course/ Medical Decision Making/ A&P Clinical Course as of 12/26/21 0657  Fri Dec 26, 2021  0526 CT scan with mildly enlarged appendix without surrounding inflammatory changes.  Discussed with Dr. Dwain Sarna who favors that this is likely not appendicitis at this time given lack of white count and meaningful CT changes despite being on day 4 of symptoms at this time.  Recommends proceeding with pelvic ultrasound.  If pelvic ultrasound is  normal they reconsult surgery and they will be willing to evaluate her at the bedside.  I appreciate his collaboration in care of this patient. [RS]    Clinical Course User Index [RS] Paris Lore, PA-C                           Medical Decision Making 36 year old female presents with concern for lower abdominal pain since yesterday as well as burning central chest pain belching.  History of GERD.  Vital signs are normal and intake.  Cardiopulmonary exam is normal, abdominal exam as above with concerns for McBurney's  point tenderness with positive Rovsing and obturator signs concerning for possible intra-abdominal infection.   We will proceed with CT of the abdomen pelvis to further evaluate her right lower abdominal pain.   Amount and/or Complexity of Data Reviewed Labs: ordered.    Details: CBC without leukocytosis or anemia.  CMP unremarkable, UA with moderate hemoglobin, ketonuria, rare bacteria and 6-10 WBCs.  She is currently on antibiotics for her previously diagnosed UTI.  Pregnancy test negative.  Troponin negative, less than 2. Radiology: ordered.    Details: Chest x-ray visualized this provider, negative for acute cardiopulmonary disease. CT equivocal for appendicitis, visualized by this provider.  Risk Prescription drug management.   Consult to surgeon as above.  Patient reevaluated after IV analgesia with improvement in the pain but persistence and persistent right lower quadrant tenderness on exam.  She is amenable to plan for ultrasound at this time.  Per surgeon, if ultrasounds are abnormal treated accordingly, if normal may reconsult general surgery and one of their providers will evaluate the patient in the ED.  Care of this patient signed out to oncoming ED provider Lars Pinks, PA-C at time of shift change.  All pertinent HPI, physical exam, laboratory findings were discussed with her prior to my departure.  Appreciate her collaboration in the care of this  patient. Rakeb  voiced understanding of her medical evaluation and treatment plan. Each of their questions answered to their expressed satisfaction.    This chart was dictated using voice recognition software, Dragon. Despite the best efforts of this provider to proofread and correct errors, errors may still occur which can change documentation meaning.   Final Clinical Impression(s) / ED Diagnoses Final diagnoses:  None    Rx / DC Orders ED Discharge Orders     None         Paris Lore, PA-C 12/26/21 0657    Melene Plan, DO 12/26/21 [phone]

## 2021-12-26 NOTE — H&P (Signed)
Dana [name] Mar 01, 1986  [id number].    Chief Complaint/Reason for Consult: acute appendicitis  HPI:  This is a 36 yo female with a history of Hepatitis B as well as H. Pylori that has been treated.  She states that about 2-3 days ago she started having some diffuse abdominal pain that migrated more towards her umbilicus.  She had an OB/GYN appointment for a routine exam 2 days ago.  She was incidentally found to have a UTI and she started amoxicillin for this.  She mentioned her pain to them, but per the patient's report, her pelvic exam was not overtly uncomfortable and not consistent with the pain she was having.  She denies any vaginal discharge.  She denies any fevers, but admits to some nausea.  Over the last 2 days her pain has migrated to the RLQ.  She presented to the [facility] for further evaluation.  She has a WBC of 6K and other labs are normal.  UA here is contaminated so not overwhelming convincing for UTI.  Her troponin was negative (complained of some chest pain on admission as well).  She underwent a CT scan that was equivocal for appendicitis with an appendix measuring 37mm.  She had a doppler pelvic US which was negative.  We have been asked to see her for further evaluation.  ROS: ROS: see HPI  Family History  Problem Relation Age of Onset   Hypertension Father    Anesthesia problems Neg Hx     Past Medical History:  Diagnosis Date   Abortion history    Hepatitis B carrier (HCC) 2003   Positive PPD     Past Surgical History:  Procedure Laterality Date   DILATION AND CURETTAGE OF UTERUS     DILATION AND EVACUATION N/A 04/14/2013   Procedure: DILATATION AND EVACUATION;  Surgeon: Loney Laurence, MD;  Location: [facility];  Service: Gynecology;  Laterality: N/A;    Social History:  reports that she has never smoked. She has never used smokeless tobacco. She reports that she does not drink alcohol and does not use drugs.  Allergies: No Known Allergies  (Not in a  hospital admission)    Physical Exam: Blood pressure 102/72, pulse 73, temperature 98.2 F (36.8 C), temperature source Oral, resp. rate 17, height 5' (1.524 m), weight 66.2 kg, SpO2 99 %, unknown if currently breastfeeding. General: pleasant, WD, WN female who is laying in bed in NAD HEENT: head is normocephalic, atraumatic.  Sclera are noninjected.  PERRL.  Ears and nose without any masses or lesions.  Mouth is pink and moist Heart: regular, rate, and rhythm.  Normal s1,s2. No obvious murmurs, gallops, or rubs noted.  Palpable radial and pedal pulses bilaterally Lungs: CTAB, no wheezes, rhonchi, or rales noted.  Respiratory effort nonlabored Abd: soft, tender in RLQ at McBurney's point, ND, +BS, no masses, hernias, or organomegaly MS: all 4 extremities are symmetrical with no cyanosis, clubbing, or edema. Skin: warm and dry with no masses, lesions, or rashes Neuro: Cranial nerves 2-12 grossly intact, sensation is normal throughout Psych: A&Ox3 with an appropriate affect.   Results for orders placed or performed during the hospital encounter of 12/25/21 (from the past 48 hour(s))  Urinalysis, Routine w reflex microscopic     Status: Abnormal   Collection Time: 12/25/21  4:06 PM  Result Value Ref Range   Color, Urine YELLOW YELLOW   APPearance HAZY (A) CLEAR   Specific Gravity, Urine 1.027 1.005 - 1.030  pH 5.0 5.0 - 8.0   Glucose, UA NEGATIVE NEGATIVE mg/dL   Hgb urine dipstick MODERATE (A) NEGATIVE   Bilirubin Urine NEGATIVE NEGATIVE   Ketones, ur 20 (A) NEGATIVE mg/dL   Protein, ur NEGATIVE NEGATIVE mg/dL   Nitrite NEGATIVE NEGATIVE   Leukocytes,Ua TRACE (A) NEGATIVE   RBC / HPF 11-20 0 - 5 RBC/hpf   WBC, UA 6-10 0 - 5 WBC/hpf   Bacteria, UA RARE (A) NONE SEEN   Squamous Epithelial / LPF 11-20 0 - 5   Mucus PRESENT     Comment: Performed at [facility], [address], [place], [place] [address]  Lipase, blood     Status: None   Collection Time: 12/25/21  4:32 PM   Result Value Ref Range   Lipase 48 11 - 51 U/L    Comment: Performed at [facility], [address], [place], [place] [address]  Comprehensive metabolic panel     Status: Abnormal   Collection Time: 12/25/21  4:32 PM  Result Value Ref Range   Sodium 139 135 - 145 mmol/L   Potassium 3.8 3.5 - 5.1 mmol/L   Chloride 105 98 - 111 mmol/L   CO2 24 22 - 32 mmol/L   Glucose, Bld 143 (H) 70 - 99 mg/dL    Comment: Glucose reference range applies only to samples taken after fasting for at least 8 hours.   BUN 10 6 - 20 mg/dL   Creatinine, Ser 2.83 0.44 - 1.00 mg/dL   Calcium 9.0 8.9 - 66.2 mg/dL   Total Protein 7.3 6.5 - 8.1 g/dL   Albumin 4.3 3.5 - 5.0 g/dL   AST 21 15 - 41 U/L   ALT 23 0 - 44 U/L   Alkaline Phosphatase 88 38 - 126 U/L   Total Bilirubin 0.8 0.3 - 1.2 mg/dL   GFR, Estimated >94 >76 mL/min    Comment: (NOTE) Calculated using the CKD-EPI Creatinine Equation (2021)    Anion gap 10 5 - 15    Comment: Performed at [facility], [address] [place], [place] [address]  CBC     Status: Abnormal   Collection Time: 12/25/21  4:32 PM  Result Value Ref Range   WBC 6.9 4.0 - 10.5 K/uL   RBC 5.84 (H) 3.87 - 5.11 MIL/uL   Hemoglobin 15.3 (H) 12.0 - 15.0 g/dL   HCT 35.4 (H) 65.6 - 81.2 %   MCV 81.2 80.0 - 100.0 fL   MCH 26.2 26.0 - 34.0 pg   MCHC 32.3 30.0 - 36.0 g/dL   RDW 75.1 70.0 - 17.4 %   Platelets 338 150 - 400 K/uL   nRBC 0.0 0.0 - 0.2 %    Comment: Performed at [facility], [address] [place], [place] [address]  Troponin I (High Sensitivity)     Status: None   Collection Time: 12/25/21  4:32 PM  Result Value Ref Range   Troponin I (High Sensitivity) 3 <18 ng/L    Comment: (NOTE) Elevated high sensitivity troponin I (hsTnI) values and significant  changes across serial measurements may suggest ACS but many other  chronic and acute conditions are known to elevate hsTnI results.  Refer to the "Links" section for chest pain algorithms and  additional  guidance. Performed at [facility], [address], [place], [place] [address]   Troponin I (High Sensitivity)     Status: None   Collection Time: 12/25/21  6:26 PM  Result Value Ref Range   Troponin I (High Sensitivity) <2 <18 ng/L    Comment: (NOTE) Elevated high sensitivity troponin I (hsTnI) values and significant  changes across serial measurements may suggest ACS but many other  chronic and acute conditions are known to elevate hsTnI results.  Refer to the "Links" section for chest pain algorithms and additional  guidance. Performed at [facility], [address] [place], [place] [address]   I-Stat beta hCG blood, ED     Status: None   Collection Time: 12/26/21  3:42 AM  Result Value Ref Range   I-stat hCG, quantitative <5.0 <5 mIU/mL   Comment 3            Comment:   GEST. AGE      CONC.  (mIU/mL)   <=1 WEEK        5 - 50     2 WEEKS       50 - 500     3 WEEKS       100 - 10,000     4 WEEKS     1,000 - 30,000        FEMALE AND NON-PREGNANT FEMALE:     LESS THAN 5 mIU/mL    [place] Art/Ven Flow Abd Pelv Doppler  Result Date: 12/26/2021 CLINICAL DATA:  General pelvic pain. EXAM: TRANSABDOMINAL AND TRANSVAGINAL ULTRASOUND OF PELVIS DOPPLER ULTRASOUND OF OVARIES TECHNIQUE: Both transabdominal and transvaginal ultrasound examinations of the pelvis were performed. Transabdominal technique was performed for global imaging of the pelvis including uterus, ovaries, adnexal regions, and pelvic cul-de-sac. It was necessary to proceed with endovaginal exam following the transabdominal exam to visualize the ovaries and adnexal spaces better. Color and duplex Doppler ultrasound was utilized to evaluate blood flow to the ovaries. COMPARISON:  CT with IV contrast earlier today. FINDINGS: Uterus Measurements: Retroverted measuring 8.2 x 3.2 x 6.6 cm = volume: 90.2 mL. No fibroids or other mass visualized. Unremarkable cervix. Endometrium Thickness: 4.1 mm. No focal  abnormality visualized. IUD again noted within the uterus in the expected location in the distal cavity. There is trace fluid in the uterine cavity. Right ovary Measurements: 3.2 x 2.2 x 3.2 cm = volume: 12 mL. Normal appearance/no adnexal mass. Left ovary Measurements: 3.0 x 1.7 x 2.9 cm = volume: 7.5 mL. Normal appearance/no adnexal mass. Pulsed Doppler evaluation of both ovaries demonstrates normal low-resistance arterial and venous waveforms. Other findings No abnormal free fluid. IMPRESSION: No acute sonographic findings. IUD is in place in expected positioning within the cavity with trace fluid in the uterine cavity. No evidence of ovarian torsion or mass. No visible fibroids. Electronically Signed   By: Almira Bar M.D.   On: 12/26/2021 07:03   CT ABDOMEN PELVIS W CONTRAST  Result Date: 12/26/2021 CLINICAL DATA:  Right lower quadrant pain and nausea. EXAM: CT ABDOMEN AND PELVIS WITH CONTRAST TECHNIQUE: Multidetector CT imaging of the abdomen and pelvis was performed using the standard protocol following bolus administration of intravenous contrast. RADIATION DOSE REDUCTION: This exam was performed according to the departmental dose-optimization program which includes automated exposure control, adjustment of the mA and/or kV according to patient size and/or use of iterative reconstruction technique. CONTRAST:  11mL OMNIPAQUE IOHEXOL 300 MG/ML  SOLN COMPARISON:  None Available. FINDINGS: Lower chest: No acute abnormality. Mild passive atelectasis in the posterior bases. Mild elevation right hemidiaphragm. Hepatobiliary: No hepatic mass enhancement. No focal abnormality of the gallbladder wall, lumen and bile ducts.  Pancreas: No focal abnormality or ductal dilatation. Spleen: No focal abnormality or splenomegaly. Adrenals/Urinary Tract: There is no adrenal or renal cortical mass enhancement. There is contrast in both renal collecting systems which could obscure intrarenal stones if present. No ureteral  stones or hydronephrosis are seen. The bladder is unremarkable. Stomach/Bowel: Unremarkable stomach, unremarkable unopacified small bowel. The appendix is enhancing and minimally prominent measuring 7 mm but there are no inflammatory changes. Nevertheless acute appendicitis could not be excluded if the patient had peritoneal signs or localizing tenderness. There is moderate stool retention. There is no evidence of focal colitis or diverticulitis. Vascular/Lymphatic: No significant vascular findings are present. No enlarged abdominal or pelvic lymph nodes. Reproductive: The uterus is intact and retroverted. There is an IUD in the expected location within the cavity. The ovaries are follicular but not enlarged. Other: Small umbilical fat hernia. There is no free air, hemorrhage or fluid or incarcerated hernia. Musculoskeletal: No acute or significant osseous findings. At L4-5 however, there is a left paracentral disc bulge causing mild spinal canal stenosis and possible mass effect on the left L5 nerve root. Please correlate clinically. IMPRESSION: 1. Equivocal findings for appendicitis. The appendix is enhancing and slightly prominent but there are no inflammatory changes around it. Clinical correlation advised. 2. Constipation and diverticulosis. 3. IUD. 4. Small umbilical fat hernia. 5. Broad left paracentral disc bulge L4-5 level with possible mass effect on the L5 nerve root. Electronically Signed   By: Almira Bar M.D.   On: 12/26/2021 04:45   DG Chest 2 View  Result Date: 12/25/2021 CLINICAL DATA:  Chest pain. EXAM: CHEST - 2 VIEW COMPARISON:  Chest x-ray 09/04/2020 FINDINGS: The heart size and mediastinal contours are within normal limits. Both lungs are clear. The visualized skeletal structures are unremarkable. IMPRESSION: No active cardiopulmonary disease. Electronically Signed   By: Darliss Cheney M.D.   On: 12/25/2021 16:48      Assessment/Plan RLQ abdominal pain, likely appendicitis The  patient has been seen and examined, chart, labs, vitals, imaging have all been personally reviewed.  Albeit the duration is a bit long, her story does seem c/w appendicitis with initial periumbilical pain migrating to the RLQ.  She has been on oral abx therapy for the last 2 days which may have help with some of her symptoms.  She denies any GYN issues such as vaginal discharge or recurrence of symptoms with vaginal exam 2 days ago at the [facility] office.  Her doppler pelvic US was negative.  I have offered her dx lap with appendectomy given her findings and symptoms.  She is agreeable to proceed with this procedure today.  If all goes well, we will likely plan for DC home from PACU.  She is agreeable to this.   FEN - NPO/IVFs VTE - none currently ID - Rocephin/Flagyl Admit - likely DC home from PACU  I reviewed ED provider notes, last 24 h vitals and pain scores, last 48 h intake and output, last 24 h labs and trends, and last 24 h imaging results.  Letha Cape, [facility] 12/26/2021, 8:31 AM Please see Amion for pager number during day hours 7:00am-4:30pm or 7:00am -11:30am on weekends

## 2021-12-26 NOTE — Anesthesia Postprocedure Evaluation (Signed)
Anesthesia Post Note  Patient: Dana [name]  Procedure(s) Performed: APPENDECTOMY LAPAROSCOPIC (Abdomen)     Patient location during evaluation: PACU Anesthesia Type: General Level of consciousness: awake and alert Pain management: pain level controlled Vital Signs Assessment: post-procedure vital signs reviewed and stable Respiratory status: spontaneous breathing, nonlabored ventilation and respiratory function stable Cardiovascular status: blood pressure returned to baseline and stable Postop Assessment: no apparent nausea or vomiting Anesthetic complications: no   No notable events documented.  Last Vitals:  Vitals:   12/26/21 1251 12/26/21 1306  BP: 116/69 105/72  Pulse: 85 79  Resp: 17 15  Temp:  36.5 C  SpO2: 98% 98%    Last Pain:  Vitals:   12/26/21 1306  TempSrc:   PainSc: Asleep                 Lucretia Kern

## 2021-12-26 NOTE — Anesthesia Preprocedure Evaluation (Signed)
Anesthesia Evaluation  Patient identified by MRN, date of birth, ID band Patient awake    Reviewed: Allergy & Precautions, NPO status , Patient's Chart, lab work & pertinent test results  History of Anesthesia Complications Negative for: history of anesthetic complications  Airway Mallampati: II  TM Distance: >3 FB Neck ROM: Full    Dental  (+) Teeth Intact, Dental Advisory Given   Pulmonary neg pulmonary ROS,    Pulmonary exam normal        Cardiovascular negative cardio ROS Normal cardiovascular exam     Neuro/Psych negative neurological ROS     GI/Hepatic GERD  ,(+) Hepatitis -, B  Endo/Other  negative endocrine ROS  Renal/GU negative Renal ROS  negative genitourinary   Musculoskeletal negative musculoskeletal ROS (+)   Abdominal   Peds  Hematology negative hematology ROS (+)   Anesthesia Other Findings   Reproductive/Obstetrics                           Anesthesia Physical Anesthesia Plan  ASA: 2 and emergent  Anesthesia Plan: General   Post-op Pain Management: Toradol IV (intra-op)* and Ofirmev IV (intra-op)*   Induction: Intravenous  PONV Risk Score and Plan: 4 or greater and Ondansetron, Dexamethasone, Treatment may vary due to age or medical condition and Midazolam  Airway Management Planned: Oral ETT  Additional Equipment: None  Intra-op Plan:   Post-operative Plan: Extubation in OR  Informed Consent: I have reviewed the patients History and Physical, chart, labs and discussed the procedure including the risks, benefits and alternatives for the proposed anesthesia with the patient or authorized representative who has indicated his/her understanding and acceptance.     Dental advisory given  Plan Discussed with:   Anesthesia Plan Comments:         Anesthesia Quick Evaluation

## 2021-12-26 NOTE — Discharge Instructions (Signed)
[facility]  Please arrive at least 30 min before your appointment to complete your check in paperwork.  If you are unable to arrive 30 min prior to your appointment time we may have to cancel or reschedule you. LAPAROSCOPIC SURGERY: POST OP INSTRUCTIONS Always review your discharge instruction sheet given to you by the facility where your surgery was performed. IF YOU HAVE DISABILITY OR FAMILY LEAVE FORMS, YOU MUST BRING THEM TO THE OFFICE FOR PROCESSING.   DO NOT GIVE THEM TO YOUR DOCTOR.  PAIN CONTROL  First take acetaminophen (Tylenol) AND/or ibuprofen (Advil) to control your pain after surgery.  Follow directions on package.  Taking acetaminophen (Tylenol) and/or ibuprofen (Advil) regularly after surgery will help to control your pain and lower the amount of prescription pain medication you may need.  You should not take more than 4,000 mg (4 grams) of acetaminophen (Tylenol) in 24 hours.  You should not take ibuprofen (Advil), aleve, motrin, naprosyn or other NSAIDS if you have a history of stomach ulcers or chronic kidney disease.  A prescription for pain medication may be given to you upon discharge.  Take your pain medication as prescribed, if you still have uncontrolled pain after taking acetaminophen (Tylenol) or ibuprofen (Advil). Use ice packs to help control pain. If you need a refill on your pain medication, please contact your pharmacy.  They will contact our office to request authorization. Prescriptions will not be filled after 5pm or on week-ends.  HOME MEDICATIONS Take your usually prescribed medications unless otherwise directed.  DIET You should follow a light diet the first few days after arrival home.  Be sure to include lots of fluids daily. Avoid fatty, fried foods.   CONSTIPATION It is common to experience some constipation after surgery and if you are taking pain medication.  Increasing fluid intake and taking a stool softener (such as Colace)  will usually help or prevent this problem from occurring.  A mild laxative (Milk of Magnesia or Miralax) should be taken according to package instructions if there are no bowel movements after 48 hours.  WOUND/INCISION CARE Most patients will experience some swelling and bruising in the area of the incisions.  Ice packs will help.  Swelling and bruising can take several days to resolve.  Unless discharge instructions indicate otherwise, follow guidelines below  STERI-STRIPS - you may remove your outer bandages 48 hours after surgery, and you may shower at that time.  You have steri-strips (small skin tapes) in place directly over the incision.  These strips should be left on the skin for 7-10 days.   DERMABOND/SKIN GLUE - you may shower in 24 hours.  The glue will flake off over the next 2-3 weeks. Any sutures or staples will be removed at the office during your follow-up visit.  ACTIVITIES You may resume regular (light) daily activities beginning the next day--such as daily self-care, walking, climbing stairs--gradually increasing activities as tolerated.  You may have sexual intercourse when it is comfortable.  Refrain from any heavy lifting or straining until approved by your doctor. You may drive when you are no longer taking prescription pain medication, you can comfortably wear a seatbelt, and you can safely maneuver your car and apply brakes.  FOLLOW-UP You should see your doctor in the office for a follow-up appointment approximately 2-3 weeks after your surgery.  You should have been given your post-op/follow-up appointment when your surgery was scheduled.  If you did not receive a post-op/follow-up appointment, make sure   that you call for this appointment within a day or two after you arrive home to insure a convenient appointment time.   WHEN TO CALL YOUR DOCTOR: Fever over 101.0 Inability to urinate Continued bleeding from incision. Increased pain, redness, or drainage from the  incision. Increasing abdominal pain  The clinic staff is available to answer your questions during regular business hours.  Please don't hesitate to call and ask to speak to one of the nurses for clinical concerns.  If you have a medical emergency, go to the nearest emergency room or call 911.  A surgeon from [facility] is always on call at the hospital. [address], [address], [place], [place]  [address] ? [address] [address], [place],    [address] [phone] ? [phone] ? FAX [phone]  

## 2021-12-26 NOTE — Transfer of Care (Signed)
Immediate Anesthesia Transfer of Care Note  Patient: Dana [name]  Procedure(s) Performed: APPENDECTOMY LAPAROSCOPIC (Abdomen)  Patient Location: PACU  Anesthesia Type:General  Level of Consciousness: awake, alert  and oriented  Airway & Oxygen Therapy: Patient connected to face mask oxygen  Post-op Assessment: Post -op Vital signs reviewed and stable  Post vital signs: stable  Last Vitals:  Vitals Value Taken Time  BP    Temp    Pulse 102 12/26/21 1221  Resp    SpO2 100 % 12/26/21 1221  Vitals shown include unvalidated device data.  Last Pain:  Vitals:   12/26/21 0945  TempSrc: Oral  PainSc: 7          Complications: No notable events documented.

## 2021-12-27 ENCOUNTER — Encounter (HOSPITAL_COMMUNITY): Payer: Self-pay | Admitting: General Surgery

## 2021-12-29 LAB — SURGICAL PATHOLOGY

## 2022-04-16 ENCOUNTER — Emergency Department (HOSPITAL_BASED_OUTPATIENT_CLINIC_OR_DEPARTMENT_OTHER): Payer: Medicaid Other

## 2022-04-16 ENCOUNTER — Other Ambulatory Visit: Payer: Self-pay

## 2022-04-16 ENCOUNTER — Encounter (HOSPITAL_BASED_OUTPATIENT_CLINIC_OR_DEPARTMENT_OTHER): Payer: Self-pay | Admitting: Urology

## 2022-04-16 ENCOUNTER — Emergency Department (HOSPITAL_BASED_OUTPATIENT_CLINIC_OR_DEPARTMENT_OTHER)
Admission: EM | Admit: 2022-04-16 | Discharge: 2022-04-16 | Disposition: A | Discharge status: Home / Self Care | Payer: Medicaid Other | Attending: Emergency Medicine | Admitting: Emergency Medicine

## 2022-04-16 DIAGNOSIS — R1031 Right lower quadrant pain: Secondary | ICD-10-CM

## 2022-04-16 DIAGNOSIS — B9689 Other specified bacterial agents as the cause of diseases classified elsewhere: Secondary | ICD-10-CM | POA: Diagnosis not present

## 2022-04-16 DIAGNOSIS — N760 Acute vaginitis: Secondary | ICD-10-CM | POA: Diagnosis not present

## 2022-04-16 LAB — COMPREHENSIVE METABOLIC PANEL
ALT: 24 U/L (ref 0–44)
AST: 22 U/L (ref 15–41)
Albumin: 4.4 g/dL (ref 3.5–5.0)
Alkaline Phosphatase: 82 U/L (ref 38–126)
Anion gap: 6 (ref 5–15)
BUN: 16 mg/dL (ref 6–20)
CO2: 26 mmol/L (ref 22–32)
Calcium: 9.1 mg/dL (ref 8.9–10.3)
Chloride: 106 mmol/L (ref 98–111)
Creatinine, Ser: 0.72 mg/dL (ref 0.44–1.00)
GFR, Estimated: >60 mL/min (ref >60)
Glucose, Bld: 99 mg/dL (ref 70–99)
Potassium: 3.9 mmol/L (ref 3.5–5.1)
Sodium: 138 mmol/L (ref 135–145)
Total Bilirubin: 0.4 mg/dL (ref 0.3–1.2)
Total Protein: 7.7 g/dL (ref 6.5–8.1)

## 2022-04-16 LAB — WET PREP, GENITAL
Sperm: NONE SEEN
Trich, Wet Prep: NONE SEEN
WBC, Wet Prep HPF POC: >=10 — AB (ref <10)
Yeast Wet Prep HPF POC: NONE SEEN

## 2022-04-16 LAB — URINALYSIS, MICROSCOPIC (REFLEX)

## 2022-04-16 LAB — CBC WITH DIFFERENTIAL/PLATELET
Abs Immature Granulocytes: 0.02 10*3/uL (ref 0.00–0.07)
Basophils Absolute: 0.1 10*3/uL (ref 0.0–0.1)
Basophils Relative: 1 %
Eosinophils Absolute: 0.2 10*3/uL (ref 0.0–0.5)
Eosinophils Relative: 2 %
HCT: 46.2 % — ABNORMAL HIGH (ref 36.0–46.0)
Hemoglobin: 14.8 g/dL (ref 12.0–15.0)
Immature Granulocytes: 0 %
Lymphocytes Relative: 32 %
Lymphs Abs: 2.6 10*3/uL (ref 0.7–4.0)
MCH: 25.8 pg — ABNORMAL LOW (ref 26.0–34.0)
MCHC: 32 g/dL (ref 30.0–36.0)
MCV: 80.5 fL (ref 80.0–100.0)
Monocytes Absolute: 0.4 10*3/uL (ref 0.1–1.0)
Monocytes Relative: 4 %
Neutro Abs: 4.9 10*3/uL (ref 1.7–7.7)
Neutrophils Relative %: 61 %
Platelets: 302 10*3/uL (ref 150–400)
RBC: 5.74 MIL/uL — ABNORMAL HIGH (ref 3.87–5.11)
RDW: 14.4 % (ref 11.5–15.5)
WBC: 8 10*3/uL (ref 4.0–10.5)
nRBC: 0 % (ref 0.0–0.2)

## 2022-04-16 LAB — URINALYSIS, ROUTINE W REFLEX MICROSCOPIC
Bilirubin Urine: NEGATIVE
Glucose, UA: NEGATIVE mg/dL
Ketones, ur: NEGATIVE mg/dL
Leukocytes,Ua: NEGATIVE
Nitrite: NEGATIVE
Protein, ur: NEGATIVE mg/dL
Specific Gravity, Urine: 1.025 (ref 1.005–1.030)
pH: 6 (ref 5.0–8.0)

## 2022-04-16 LAB — PREGNANCY, URINE: Preg Test, Ur: NEGATIVE

## 2022-04-16 LAB — LIPASE, BLOOD: Lipase: 45 U/L (ref 11–51)

## 2022-04-16 MED ORDER — METRONIDAZOLE 500 MG PO TABS
500.0000 mg | ORAL_TABLET | Freq: Once | ORAL | Status: AC
  Administered 2022-04-16: 500 mg via ORAL
  Filled 2022-04-16: qty 1

## 2022-04-16 MED ORDER — IOHEXOL 300 MG/ML  SOLN
100.0000 mL | Freq: Once | INTRAMUSCULAR | Status: AC | PRN
  Administered 2022-04-16: 100 mL via INTRAVENOUS

## 2022-04-16 MED ORDER — METRONIDAZOLE 500 MG PO TABS: 500.0000 mg | ORAL_TABLET | Freq: Two times a day (BID) | ORAL | 0 refills | Status: AC

## 2022-04-16 NOTE — ED Triage Notes (Signed)
Pt states right sided groin pain x 2 weeks with RLQ pain that started today  States vaginal discharge and burning with urination

## 2022-04-16 NOTE — Discharge Instructions (Addendum)
It was a pleasure taking care of you today!  Your labs today were overall unremarkable.  Your wet prep did show concerns for bacterial vaginosis.  The CT scan was unremarkable today.  Ensure to maintain follow-up with your primary care provider as well as your OB/GYN specialist regarding today's ED visit.  Return to the emergency department for experiencing increasing/worsening symptoms.

## 2022-04-16 NOTE — ED Provider Notes (Signed)
[facility] Provider Note   CSN: [id number] Arrival date & time: 04/16/22  1516     History  Chief Complaint  Patient presents with   Groin Pain    Dana [name] is a 36 y.o. female who presents to the emergency department with concerns for right groin pain onset 2 weeks.  Also notes intermittent right lower quadrant abdominal pain yesterday has been worsening.  Notes that the right inguinal region is tender to the touch.  Also notes that she has right lower back pain that radiates to her right inguinal region.  Has associated vaginal discharge, dysuria, frequency, nausea.  Has tried ibuprofen with mildly for his symptoms.  Denies history of UTI or kidney stones.  No concerns for STDs at this time.  Denies hematuria, urgency, vaginal bleeding, vomiting.   The history is provided by the patient. No language interpreter was used.       Home Medications Prior to Admission medications   Medication Sig Start Date End Date Taking? Authorizing Provider  metroNIDAZOLE (FLAGYL) 500 MG tablet Take 1 tablet (500 mg total) by mouth 2 (two) times daily. 04/16/22  Yes Daeveon Zweber A, PA-C  acetaminophen (TYLENOL) 500 MG tablet Take 2 tablets (1,000 mg total) by mouth every 8 (eight) hours as needed for mild pain (pain). 12/26/21   Maczis, Barth Kirks, PA-C  amoxicillin (AMOXIL) 500 MG capsule Take 500 mg by mouth 3 (three) times daily. 12/19/21   [provider]  famotidine (PEPCID) 20 MG tablet Take 20 mg by mouth 2 (two) times daily as needed for heartburn. 09/12/18   [provider]  Sanjuan Dame 17 GM/SCOOP powder SMARTSIG:17 Gram(s) By Mouth Daily PRN Patient not taking: Reported on 12/26/2021 12/15/21   [provider]  levonorgestrel (MIRENA, 52 MG,) 20 MCG/DAY IUD Mirena 21 mcg/24 hours (8 yrs) 52 mg intrauterine device  provided by [facility]    [provider]  oxyCODONE (ROXICODONE) 5 MG immediate release tablet Take 1 tablet (5 mg  total) by mouth every 6 (six) hours as needed for breakthrough pain. 12/26/21   Maczis, Barth Kirks, PA-C  Vitamin D, Ergocalciferol, (DRISDOL) 1.25 MG (50000 UNIT) CAPS capsule Take 1 capsule (50,000 Units total) by mouth every 7 (seven) days. Patient not taking: Reported on 12/26/2021 10/01/21   Marcial Pacas, MD      Allergies    Patient has no known allergies.    Review of Systems   Review of Systems  Gastrointestinal:  Positive for abdominal pain and nausea. Negative for vomiting.  Genitourinary:  Positive for dysuria, frequency and vaginal discharge. Negative for hematuria, urgency and vaginal bleeding.       +vaginal itching  All other systems reviewed and are negative.   Physical Exam Updated Vital Signs BP 137/82 (BP Location: Right Arm)   Pulse 70   Temp 98.5 F (36.9 C) (Oral)   Resp 18   Ht 5' (1.524 m)   Wt 68 kg   SpO2 99%   BMI 29.29 kg/m  Physical Exam Vitals and nursing note reviewed.  Constitutional:      General: She is not in acute distress.    Appearance: She is not diaphoretic.  HENT:     Head: Normocephalic and atraumatic.     Mouth/Throat:     Pharynx: No oropharyngeal exudate.  Eyes:     General: No scleral icterus.    Conjunctiva/sclera: Conjunctivae normal.  Cardiovascular:     Rate and Rhythm: Normal rate and  regular rhythm.     Pulses: Normal pulses.     Heart sounds: Normal heart sounds.  Pulmonary:     Effort: Pulmonary effort is normal. No respiratory distress.     Breath sounds: Normal breath sounds. No wheezing.  Abdominal:     General: Bowel sounds are normal.     Palpations: Abdomen is soft. There is no mass.     Tenderness: There is generalized abdominal tenderness. There is right CVA tenderness. There is no guarding or rebound.     Comments: Diffuse abdominal TTP. Right CVA TTP.   Musculoskeletal:        General: Normal range of motion.     Cervical back: Normal range of motion and neck supple.  Skin:    General: Skin is warm and  dry.  Neurological:     Mental Status: She is alert.  Psychiatric:        Behavior: Behavior normal.     ED Results / Procedures / Treatments   Labs (all labs ordered are listed, but only abnormal results are displayed) Labs Reviewed  WET PREP, GENITAL - Abnormal; Notable for the following components:      Result Value   Clue Cells Wet Prep HPF POC PRESENT (*)    WBC, Wet Prep HPF POC >=10 (*)    All other components within normal limits  URINALYSIS, ROUTINE W REFLEX MICROSCOPIC - Abnormal; Notable for the following components:   Hgb urine dipstick TRACE (*)    All other components within normal limits  CBC WITH DIFFERENTIAL/PLATELET - Abnormal; Notable for the following components:   RBC 5.74 (*)    HCT 46.2 (*)    MCH 25.8 (*)    All other components within normal limits  URINALYSIS, MICROSCOPIC (REFLEX) - Abnormal; Notable for the following components:   Bacteria, UA FEW (*)    All other components within normal limits  PREGNANCY, URINE  COMPREHENSIVE METABOLIC PANEL  LIPASE, BLOOD    EKG None  Radiology CT ABDOMEN PELVIS W CONTRAST  Result Date: 04/16/2022 CLINICAL DATA:  Right lower quadrant pain for 2 weeks with increase over the past few hours, initial encounter EXAM: CT ABDOMEN AND PELVIS WITH CONTRAST TECHNIQUE: Multidetector CT imaging of the abdomen and pelvis was performed using the standard protocol following bolus administration of intravenous contrast. RADIATION DOSE REDUCTION: This exam was performed according to the departmental dose-optimization program which includes automated exposure control, adjustment of the mA and/or kV according to patient size and/or use of iterative reconstruction technique. CONTRAST:  184mL OMNIPAQUE IOHEXOL 300 MG/ML  SOLN COMPARISON:  None Available. FINDINGS: Lower chest: No acute abnormality. Hepatobiliary: No focal liver abnormality is seen. No gallstones, gallbladder wall thickening, or biliary dilatation. Pancreas:  Unremarkable. No pancreatic ductal dilatation or surrounding inflammatory changes. Spleen: Normal in size without focal abnormality. Adrenals/Urinary Tract: Adrenal glands are within normal limits. Kidneys are well visualize within normal enhancement pattern. No renal calculi or obstructive changes are noted. The bladder is within normal limits. Stomach/Bowel: No obstructive or inflammatory changes of the colon are seen. And extends been surgically removed. Small bowel and stomach are unremarkable. Vascular/Lymphatic: No significant vascular findings are present. No enlarged abdominal or pelvic lymph nodes. Reproductive: Uterus and bilateral adnexa are unremarkable. IUD is noted in place. Other: No abdominal wall hernia or abnormality. No abdominopelvic ascites. Musculoskeletal: No acute or significant osseous findings. IMPRESSION: Status post appendectomy. No acute abnormality noted. Electronically Signed   By: Inez Catalina M.D.   On:  04/16/2022 22:54    Procedures Procedures    Medications Ordered in ED Medications  iohexol (OMNIPAQUE) 300 MG/ML solution 100 mL (100 mLs Intravenous Contrast Given 04/16/22 2226)  metroNIDAZOLE (FLAGYL) tablet 500 mg (500 mg Oral Given 04/16/22 2326)    ED Course/ Medical Decision Making/ A&P Clinical Course as of 04/17/22 0015  Thu Apr 16, 2022  2135 Clue Cells Wet Prep HPF POC(!): PRESENT [SB]  2316 Discussed with patient discharge treatment plan [SB]    Clinical Course User Index [SB] Obryan Radu A, PA-C                           Medical Decision Making Amount and/or Complexity of Data Reviewed Labs: ordered. Decision-making details documented in ED Course. Radiology: ordered.  Risk Prescription drug management.   Patient presents to the emergency department with right groin pain and RLQ abdominal pain. Pt afebrile. On exam patient with TTP noted to LLQ and RLQ. RN chaperone present for exam, white/yellow vaginal discharge noted on GU exam. No acute  cardiovascular or respiratory exam findings. Differential diagnosis includes pancreatitis, cholecystitis, appendicitis, acute cystitis, candidiasis, bacterial vaginosis.  Labs:  I ordered, and personally interpreted labs.  The pertinent results include:  Lipase unremarkable CBC without leukocytosis CMP unremarkable Wet prep notable for bacterial vaginosis  Imaging: I ordered imaging studies including CT abdomen pelvis with I independently visualized and interpreted imaging which showed no acute abnormality noted I agree with the radiologist interpretation  Medications:  I ordered medication including Flagyl for treatment for bacterial vaginosis I have reviewed the patients home medicines and have made adjustments as needed   Disposition: Presentation suspicious for abdominal pain.  Doubt acute findings such as appendicitis, patient with an appendectomy.  Doubt diverticulitis, acute cystitis at this time.. After consideration of the diagnostic results and the patients response to treatment, I feel that the patient would benefit from Discharge home.  Patient provided with a prescription for Flagyl.  Instructed patient to follow-up with primary care provider as well as OB/GYN regarding today's ED visit. Supportive care measures and strict return precautions discussed with patient at bedside. Pt acknowledges and verbalizes understanding. Pt appears safe for discharge. Follow up as indicated in discharge paperwork.   This chart was dictated using voice recognition software, Dragon. Despite the best efforts of this provider to proofread and correct errors, errors may still occur which can change documentation meaning.   Final Clinical Impression(s) / ED Diagnoses Final diagnoses:  Right inguinal pain  Bacterial vaginosis    Rx / DC Orders ED Discharge Orders          Ordered    metroNIDAZOLE (FLAGYL) 500 MG tablet  2 times daily        04/16/22 2320              Kanita Delage  A, PA-C 04/17/22 0015    Tretha Sciara, MD 04/17/22 1520

## 2022-09-24 ENCOUNTER — Emergency Department (HOSPITAL_BASED_OUTPATIENT_CLINIC_OR_DEPARTMENT_OTHER): Payer: Medicaid Other

## 2022-09-24 ENCOUNTER — Other Ambulatory Visit: Payer: Self-pay

## 2022-09-24 ENCOUNTER — Emergency Department (HOSPITAL_BASED_OUTPATIENT_CLINIC_OR_DEPARTMENT_OTHER)
Admission: EM | Admit: 2022-09-24 | Discharge: 2022-09-24 | Disposition: A | Discharge status: Home / Self Care | Payer: Medicaid Other | Attending: Emergency Medicine | Admitting: Emergency Medicine

## 2022-09-24 ENCOUNTER — Encounter (HOSPITAL_BASED_OUTPATIENT_CLINIC_OR_DEPARTMENT_OTHER): Payer: Self-pay

## 2022-09-24 DIAGNOSIS — R079 Chest pain, unspecified: Secondary | ICD-10-CM | POA: Insufficient documentation

## 2022-09-24 DIAGNOSIS — R1013 Epigastric pain: Secondary | ICD-10-CM | POA: Diagnosis not present

## 2022-09-24 DIAGNOSIS — R1011 Right upper quadrant pain: Secondary | ICD-10-CM | POA: Diagnosis not present

## 2022-09-24 LAB — URINALYSIS, ROUTINE W REFLEX MICROSCOPIC
Bilirubin Urine: NEGATIVE
Glucose, UA: NEGATIVE mg/dL
Hgb urine dipstick: NEGATIVE
Ketones, ur: 15 mg/dL — AB
Leukocytes,Ua: NEGATIVE
Nitrite: NEGATIVE
Protein, ur: NEGATIVE mg/dL
Specific Gravity, Urine: >=1.03 (ref 1.005–1.030)
pH: 6 (ref 5.0–8.0)

## 2022-09-24 LAB — PREGNANCY, URINE: Preg Test, Ur: NEGATIVE

## 2022-09-24 LAB — HEPATIC FUNCTION PANEL
ALT: 31 U/L (ref 0–44)
AST: 28 U/L (ref 15–41)
Albumin: 4.2 g/dL (ref 3.5–5.0)
Alkaline Phosphatase: 77 U/L (ref 38–126)
Bilirubin, Direct: <0.1 mg/dL (ref 0.0–0.2)
Total Bilirubin: 0.5 mg/dL (ref 0.3–1.2)
Total Protein: 7.3 g/dL (ref 6.5–8.1)

## 2022-09-24 LAB — LIPASE, BLOOD: Lipase: 45 U/L (ref 11–51)

## 2022-09-24 LAB — BASIC METABOLIC PANEL
Anion gap: 5 (ref 5–15)
BUN: 17 mg/dL (ref 6–20)
CO2: 24 mmol/L (ref 22–32)
Calcium: 8.1 mg/dL — ABNORMAL LOW (ref 8.9–10.3)
Chloride: 106 mmol/L (ref 98–111)
Creatinine, Ser: 0.63 mg/dL (ref 0.44–1.00)
GFR, Estimated: >60 mL/min (ref >60)
Glucose, Bld: 100 mg/dL — ABNORMAL HIGH (ref 70–99)
Potassium: 3.5 mmol/L (ref 3.5–5.1)
Sodium: 135 mmol/L (ref 135–145)

## 2022-09-24 LAB — CBC
HCT: 44.3 % (ref 36.0–46.0)
Hemoglobin: 14.2 g/dL (ref 12.0–15.0)
MCH: 26.2 pg (ref 26.0–34.0)
MCHC: 32.1 g/dL (ref 30.0–36.0)
MCV: 81.7 fL (ref 80.0–100.0)
Platelets: 277 10*3/uL (ref 150–400)
RBC: 5.42 MIL/uL — ABNORMAL HIGH (ref 3.87–5.11)
RDW: 14.3 % (ref 11.5–15.5)
WBC: 5.5 10*3/uL (ref 4.0–10.5)
nRBC: 0 % (ref 0.0–0.2)

## 2022-09-24 LAB — TROPONIN I (HIGH SENSITIVITY): Troponin I (High Sensitivity): <2 ng/L (ref <18)

## 2022-09-24 MED ORDER — ONDANSETRON HCL 4 MG/2ML IJ SOLN
4.0000 mg | Freq: Once | INTRAMUSCULAR | Status: AC
  Administered 2022-09-24: 4 mg via INTRAVENOUS
  Filled 2022-09-24: qty 2

## 2022-09-24 MED ORDER — ONDANSETRON 4 MG PO TBDP: 4.0000 mg | ORAL_TABLET | Freq: Three times a day (TID) | ORAL | 0 refills | Status: AC | PRN

## 2022-09-24 MED ORDER — SODIUM CHLORIDE 0.9 % IV BOLUS
1000.0000 mL | Freq: Once | INTRAVENOUS | Status: AC
  Administered 2022-09-24: 1000 mL via INTRAVENOUS

## 2022-09-24 MED ORDER — IOHEXOL 350 MG/ML SOLN
100.0000 mL | Freq: Once | INTRAVENOUS | Status: AC | PRN
  Administered 2022-09-24: 100 mL via INTRAVENOUS

## 2022-09-24 NOTE — ED Notes (Signed)
Discharge paperwork reviewed entirely with patient, including Rx's and follow up care. Pain was under control. Pt verbalized understanding as well as all parties involved. No questions or concerns voiced at the time of discharge. No acute distress noted.   Pt ambulated out to PVA without incident or assistance.  

## 2022-09-24 NOTE — ED Provider Notes (Signed)
[facility] Provider Note   CSN: [id number] Arrival date & time: 09/24/22  1000     History  Chief Complaint  Patient presents with   Chest Pain    Dana [name] is a 37 y.o. female with a past medical history significant for chronic hepatitis B, GERD, recently diagnosed hepatitis A per patient who presents to the ED due to epigastric and left-sided chest pain x 3 days.  Abdominal pain associated with nausea. 1 episodes of NBNB emesis yesterday.  Also endorses difficulties urinating.  No dysuria. No vaginal discharge.  Denies concerns for STIs.  Patient describes abdominal and chest pain as a burning sharp pressure.  Patient still has her gallbladder.  No history of blood clots, recent surgeries, recent long immobilizations.  No lower extremity edema. No cardiac history.  Denies alcohol use and chronic NSAIDs.   History obtained from patient and past medical records. No interpreter used during encounter.       Home Medications Prior to Admission medications   Medication Sig Start Date End Date Taking? Authorizing Provider  ondansetron (ZOFRAN-ODT) 4 MG disintegrating tablet Take 1 tablet (4 mg total) by mouth every 8 (eight) hours as needed for nausea or vomiting. 09/24/22  Yes Secundino Ellithorpe, Druscilla Brownie, PA-C  acetaminophen (TYLENOL) 500 MG tablet Take 2 tablets (1,000 mg total) by mouth every 8 (eight) hours as needed for mild pain (pain). 12/26/21   Maczis, Barth Kirks, PA-C  amoxicillin (AMOXIL) 500 MG capsule Take 500 mg by mouth 3 (three) times daily. 12/19/21   [provider]  famotidine (PEPCID) 20 MG tablet Take 20 mg by mouth 2 (two) times daily as needed for heartburn. 09/12/18   [provider]  Sanjuan Dame 17 GM/SCOOP powder SMARTSIG:17 Gram(s) By Mouth Daily PRN Patient not taking: Reported on 12/26/2021 12/15/21   [provider]  levonorgestrel (MIRENA, 52 MG,) 20 MCG/DAY IUD Mirena 21 mcg/24 hours (8 yrs) 52 mg  intrauterine device  provided by [facility]    [provider]  metroNIDAZOLE (FLAGYL) 500 MG tablet Take 1 tablet (500 mg total) by mouth 2 (two) times daily. 04/16/22   Blue, Soijett A, PA-C  oxyCODONE (ROXICODONE) 5 MG immediate release tablet Take 1 tablet (5 mg total) by mouth every 6 (six) hours as needed for breakthrough pain. 12/26/21   Maczis, Barth Kirks, PA-C  Vitamin D, Ergocalciferol, (DRISDOL) 1.25 MG (50000 UNIT) CAPS capsule Take 1 capsule (50,000 Units total) by mouth every 7 (seven) days. Patient not taking: Reported on 12/26/2021 10/01/21   Marcial Pacas, MD      Allergies    Patient has no known allergies.    Review of Systems   Review of Systems  Constitutional:  Negative for chills and fever.  Respiratory:  Negative for shortness of breath.   Cardiovascular:  Positive for chest pain. Negative for leg swelling.  Gastrointestinal:  Positive for abdominal pain, nausea and vomiting. Negative for diarrhea.  Genitourinary:  Positive for difficulty urinating. Negative for dysuria.    Physical Exam Updated Vital Signs BP 101/68   Pulse 80   Temp 98 F (36.7 C)   Resp 18   Ht 5' (1.524 m)   Wt 68.9 kg   SpO2 100%   BMI 29.69 kg/m  Physical Exam Vitals and nursing note reviewed.  Constitutional:      General: She is not in acute distress.    Appearance: She is not ill-appearing.  HENT:  Head: Normocephalic.  Eyes:     Pupils: Pupils are equal, round, and reactive to light.  Cardiovascular:     Rate and Rhythm: Normal rate and regular rhythm.     Pulses: Normal pulses.     Heart sounds: Normal heart sounds. No murmur heard.    No friction rub. No gallop.  Pulmonary:     Effort: Pulmonary effort is normal.     Breath sounds: Normal breath sounds.  Abdominal:     General: Abdomen is flat. There is no distension.     Palpations: Abdomen is soft.     Tenderness: There is abdominal tenderness. There is no guarding or rebound.     Comments: Epigastric and  RUQ tenderness  Musculoskeletal:        General: Normal range of motion.     Cervical back: Neck supple.     Comments: No lower extremity edema  Skin:    General: Skin is warm and dry.  Neurological:     General: No focal deficit present.     Mental Status: She is alert.  Psychiatric:        Mood and Affect: Mood normal.        Behavior: Behavior normal.     ED Results / Procedures / Treatments   Labs (all labs ordered are listed, but only abnormal results are displayed) Labs Reviewed  BASIC METABOLIC PANEL - Abnormal; Notable for the following components:      Result Value   Glucose, Bld 100 (*)    Calcium 8.1 (*)    All other components within normal limits  CBC - Abnormal; Notable for the following components:   RBC 5.42 (*)    All other components within normal limits  URINALYSIS, ROUTINE W REFLEX MICROSCOPIC - Abnormal; Notable for the following components:   Ketones, ur 15 (*)    All other components within normal limits  PREGNANCY, URINE  HEPATIC FUNCTION PANEL  LIPASE, BLOOD  TROPONIN I (HIGH SENSITIVITY)  TROPONIN I (HIGH SENSITIVITY)    EKG EKG Interpretation  Date/Time:  Thursday September 24 2022 10:26:29 EDT Ventricular Rate:  74 PR Interval:  172 QRS Duration: 111 QT Interval:  399 QTC Calculation: 443 R Axis:   77 Text Interpretation: Sinus rhythm No significant change since last tracing Confirmed by Cindee Lame [phone]) on 09/24/2022 1:34:50 PM  Radiology CT Angio Chest PE W and/or Wo Contrast  Result Date: 09/24/2022 CLINICAL DATA:  Concern for pulmonary embolus. Also right lower quadrant abdominal pain. EXAM: CT ANGIOGRAPHY CHEST CT ABDOMEN AND PELVIS WITH CONTRAST TECHNIQUE: Multidetector CT imaging of the chest was performed using the standard protocol during bolus administration of intravenous contrast. Multiplanar CT image reconstructions and MIPs were obtained to evaluate the vascular anatomy. Multidetector CT imaging of the abdomen and pelvis  was performed using the standard protocol during bolus administration of intravenous contrast. RADIATION DOSE REDUCTION: This exam was performed according to the departmental dose-optimization program which includes automated exposure control, adjustment of the mA and/or kV according to patient size and/or use of iterative reconstruction technique. CONTRAST:  119m OMNIPAQUE IOHEXOL 350 MG/ML SOLN COMPARISON:  CT abdomen pelvis April 16, 2022. FINDINGS: CTA CHEST FINDINGS Cardiovascular: Satisfactory opacification of the pulmonary arteries to the distal lobar/proximal segmental level. No evidence of pulmonary embolism. Normal heart size. No pericardial effusion. Mediastinum/Nodes: No suspicious thyroid nodule. No pathologically enlarged mediastinal, hilar or axillary lymph nodes. The esophagus is grossly unremarkable. Lungs/Pleura: Hypoventilatory change in the dependent lungs. No pleural  effusion. No pneumothorax. Central airways are clear Musculoskeletal: No acute osseous abnormality. Review of the MIP images confirms the above findings. CT ABDOMEN and PELVIS FINDINGS Hepatobiliary: No suspicious hepatic lesion. Gallbladder is unremarkable. No biliary ductal dilation. Pancreas: No pancreatic ductal dilation or evidence of acute inflammation. Spleen: No splenomegaly. Adrenals/Urinary Tract: Bilateral adrenal glands appear normal. No hydronephrosis. Kidneys demonstrate symmetric enhancement and excretion of contrast material. Mild symmetric wall thickening of a nondistended urinary bladder. Stomach/Bowel: No radiopaque enteric contrast material was administered. Stomach is minimally distended. No pathologic dilation of small or large bowel. Appendix is surgically absent. No bowel wall thickening. Vascular/Lymphatic: Normal caliber abdominal aorta. Smooth IVC contours. The portal, splenic and superior mesenteric veins are patent. No pathologically enlarged abdominal or pelvic lymph nodes. Reproductive: Intrauterine  device appears appropriate in positioning. No suspicious adnexal mass. Other: Trace pelvic free fluid is within physiologic normal limits. Musculoskeletal: No acute osseous abnormality. Review of the MIP images confirms the above findings. IMPRESSION: 1. No evidence of pulmonary embolus. 2. Mild symmetric wall thickening of a nondistended urinary bladder, which may be due to underdistention but can be seen in the setting of cystitis. Correlate with urinalysis. 3. No other acute abnormality in the abdomen or pelvis. 4. Status post appendectomy. Electronically Signed   By: Dahlia Bailiff M.D.   On: 09/24/2022 17:41   CT ABDOMEN PELVIS W CONTRAST  Result Date: 09/24/2022 CLINICAL DATA:  Concern for pulmonary embolus. Also right lower quadrant abdominal pain. EXAM: CT ANGIOGRAPHY CHEST CT ABDOMEN AND PELVIS WITH CONTRAST TECHNIQUE: Multidetector CT imaging of the chest was performed using the standard protocol during bolus administration of intravenous contrast. Multiplanar CT image reconstructions and MIPs were obtained to evaluate the vascular anatomy. Multidetector CT imaging of the abdomen and pelvis was performed using the standard protocol during bolus administration of intravenous contrast. RADIATION DOSE REDUCTION: This exam was performed according to the departmental dose-optimization program which includes automated exposure control, adjustment of the mA and/or kV according to patient size and/or use of iterative reconstruction technique. CONTRAST:  178m OMNIPAQUE IOHEXOL 350 MG/ML SOLN COMPARISON:  CT abdomen pelvis April 16, 2022. FINDINGS: CTA CHEST FINDINGS Cardiovascular: Satisfactory opacification of the pulmonary arteries to the distal lobar/proximal segmental level. No evidence of pulmonary embolism. Normal heart size. No pericardial effusion. Mediastinum/Nodes: No suspicious thyroid nodule. No pathologically enlarged mediastinal, hilar or axillary lymph nodes. The esophagus is grossly  unremarkable. Lungs/Pleura: Hypoventilatory change in the dependent lungs. No pleural effusion. No pneumothorax. Central airways are clear Musculoskeletal: No acute osseous abnormality. Review of the MIP images confirms the above findings. CT ABDOMEN and PELVIS FINDINGS Hepatobiliary: No suspicious hepatic lesion. Gallbladder is unremarkable. No biliary ductal dilation. Pancreas: No pancreatic ductal dilation or evidence of acute inflammation. Spleen: No splenomegaly. Adrenals/Urinary Tract: Bilateral adrenal glands appear normal. No hydronephrosis. Kidneys demonstrate symmetric enhancement and excretion of contrast material. Mild symmetric wall thickening of a nondistended urinary bladder. Stomach/Bowel: No radiopaque enteric contrast material was administered. Stomach is minimally distended. No pathologic dilation of small or large bowel. Appendix is surgically absent. No bowel wall thickening. Vascular/Lymphatic: Normal caliber abdominal aorta. Smooth IVC contours. The portal, splenic and superior mesenteric veins are patent. No pathologically enlarged abdominal or pelvic lymph nodes. Reproductive: Intrauterine device appears appropriate in positioning. No suspicious adnexal mass. Other: Trace pelvic free fluid is within physiologic normal limits. Musculoskeletal: No acute osseous abnormality. Review of the MIP images confirms the above findings. IMPRESSION: 1. No evidence of pulmonary embolus. 2. Mild symmetric wall  thickening of a nondistended urinary bladder, which may be due to underdistention but can be seen in the setting of cystitis. Correlate with urinalysis. 3. No other acute abnormality in the abdomen or pelvis. 4. Status post appendectomy. Electronically Signed   By: Dahlia Bailiff M.D.   On: 09/24/2022 17:41   US Abdomen Limited RUQ (LIVER/GB)  Result Date: 09/24/2022 CLINICAL DATA:  History of RIGHT upper quadrant pain for 3 days. EXAM: ULTRASOUND ABDOMEN LIMITED RIGHT UPPER QUADRANT COMPARISON:   CT of the abdomen and pelvis from October of 2023 FINDINGS: Gallbladder: No gallstones or wall thickening visualized. No sonographic Murphy sign noted by sonographer. Common bile duct: Diameter: 3.1 mm Liver: No focal lesion identified. Within normal limits in parenchymal echogenicity. Portal vein is patent on color Doppler imaging with normal direction of blood flow towards the liver. Other: None. IMPRESSION: No acute biliary process acute findings on RIGHT upper quadrant sonogram. Electronically Signed   By: Zetta Bills M.D.   On: 09/24/2022 13:24   DG Chest 2 View  Result Date: 09/24/2022 CLINICAL DATA:  Table formatting from the original note was not included. Pt reports mid chest to epigastric area . Painful with palpation x 3 days Pain is sharp and intermittent . Complains of lower abd pain intermittent as well. EXAM: CHEST - 2 VIEW COMPARISON:  12/25/2021 FINDINGS: Lungs are clear. Heart size and mediastinal contours are within normal limits. No effusion. Visualized bones unremarkable. IMPRESSION: No acute cardiopulmonary disease. Electronically Signed   By: Lucrezia Europe M.D.   On: 09/24/2022 10:39    Procedures Procedures    Medications Ordered in ED Medications  ondansetron (ZOFRAN) injection 4 mg (4 mg Intravenous Given 09/24/22 1255)  sodium chloride 0.9 % bolus 1,000 mL ( Intravenous Stopped 09/24/22 1656)  iohexol (OMNIPAQUE) 350 MG/ML injection 100 mL (100 mLs Intravenous Contrast Given 09/24/22 1713)    ED Course/ Medical Decision Making/ A&P Clinical Course as of 09/24/22 1755  Thu Sep 24, 2022  1539 Reassessed patient, patient continues to endorse pleuritic chest pain, epigastric pain, and lower abdominal pain.  Will obtain CTA chest rule out PE and CT abdomen [CA]  1544 . [CA]    Clinical Course User Index [CA] Suzy Bouchard, PA-C                             Medical Decision Making Amount and/or Complexity of Data Reviewed Independent Historian: spouse Labs:  ordered. Decision-making details documented in ED Course. Radiology: ordered and independent interpretation performed. Decision-making details documented in ED Course. ECG/medicine tests: ordered and independent interpretation performed. Decision-making details documented in ED Course.  Risk Prescription drug management.   This patient presents to the ED for concern of CP, abd pain, this involves an extensive number of treatment options, and is a complaint that carries with it a high risk of complications and morbidity.  The differential diagnosis includes ACS, PE, pancreatitis, bowel obstruction, pyelonephritis, etc  37 year old female presents to the ED due to epigastric and chest pain x 3 days.  History of hepatitis B and A.  Denies chronic alcohol use and chronic NSAIDs.  No cardiac history.  No history of blood clots.  Also admits to difficulties urinating. Upon arrival, patient afebrile, not tachycardic or hypoxic.  Patient in no acute distress.  Tenderness throughout epigastrium and right upper quadrant.  Routine labs ordered.  Right upper quadrant ultrasound rule out evidence of acute cholecystitis or gallstones.  Cardiac labs ordered to rule out ACS.  CBC reassuring.  No leukocytosis.  Normal hemoglobin.  UA negative for signs of infection.  Pregnancy test negative.  Doubt ectopic pregnancy.  BMP reassuring.  Normal renal function.  No major electrolyte derangements.  Normal LFTs.  Lipase normal.  Doubt pancreatitis.  Troponin normal.  EKG normal sinus rhythm with no signs of acute ischemia.  Doubt ACS.  Chest x-ray personally reviewed and interpreted which is negative for signs of pneumonia, pneumothorax or widened mediastinum.  Right upper quadrant ultrasound negative without evidence of acute cholecystitis. No gallstones.   Upon reassessment, patient admits to continued epigastric and pleuritic chest pain.  Will obtain CTA to rule out PE and CT abdomen.  CTA chest and CT abdomen negative  for any acute abnormalities.  Does demonstrate mild wall thickening of the bladder.  UA negative for signs of infection.  Given difficulties urinating will refer to urology for further evaluation.  Unknown etiology of pain.  Patient admits to some improvement after IV fluids and Zofran.  Advised patient to follow-up with PCP if symptoms do not improve over the next week. Strict ED precautions discussed with patient. Patient states understanding and agrees to plan. Patient discharged home in no acute distress and stable vitals  No PCP        Final Clinical Impression(s) / ED Diagnoses Final diagnoses:  Nonspecific chest pain  Epigastric pain    Rx / DC Orders ED Discharge Orders          Ordered    ondansetron (ZOFRAN-ODT) 4 MG disintegrating tablet  Every 8 hours PRN        09/24/22 1753              Suzy Bouchard, PA-C 09/24/22 1755    Audley Hose, MD 09/28/22 [phone]

## 2022-09-24 NOTE — ED Triage Notes (Addendum)
Pt reports mid chest to epigastric area . Painful with palpation x 3 days  Pain is sharp and intermittent . Complains of lower abd pain intermittent as well. Difficulty with urinating. Vomited yesterday Nose bleed this am . Dizzy. Pt reports HEP B and was recently dx with hep A

## 2022-09-24 NOTE — Discharge Instructions (Addendum)
It was a pleasure taking care of you today.  As discussed, all of your labs are reassuring.  CT scans were negative for any acute abnormalities.  I am sending you home with nausea medication.  Take as needed for nausea.  Please follow-up with PCP within 1 week.  I have included the number of the urologist.  If difficulties urinating continue please call to schedule an appointment.  Return to the ER for any worsening symptoms.

## 2022-09-24 NOTE — ED Notes (Signed)
Ultrasound at bedside

## 2023-06-18 IMAGING — CR DG CHEST 2V
2 series · 2 of 2 positions shown · non-contrast
Comparison: Chest x-ray 09/04/2020

CLINICAL DATA: Chest pain.

EXAM:
CHEST - 2 VIEW

[chest pa]
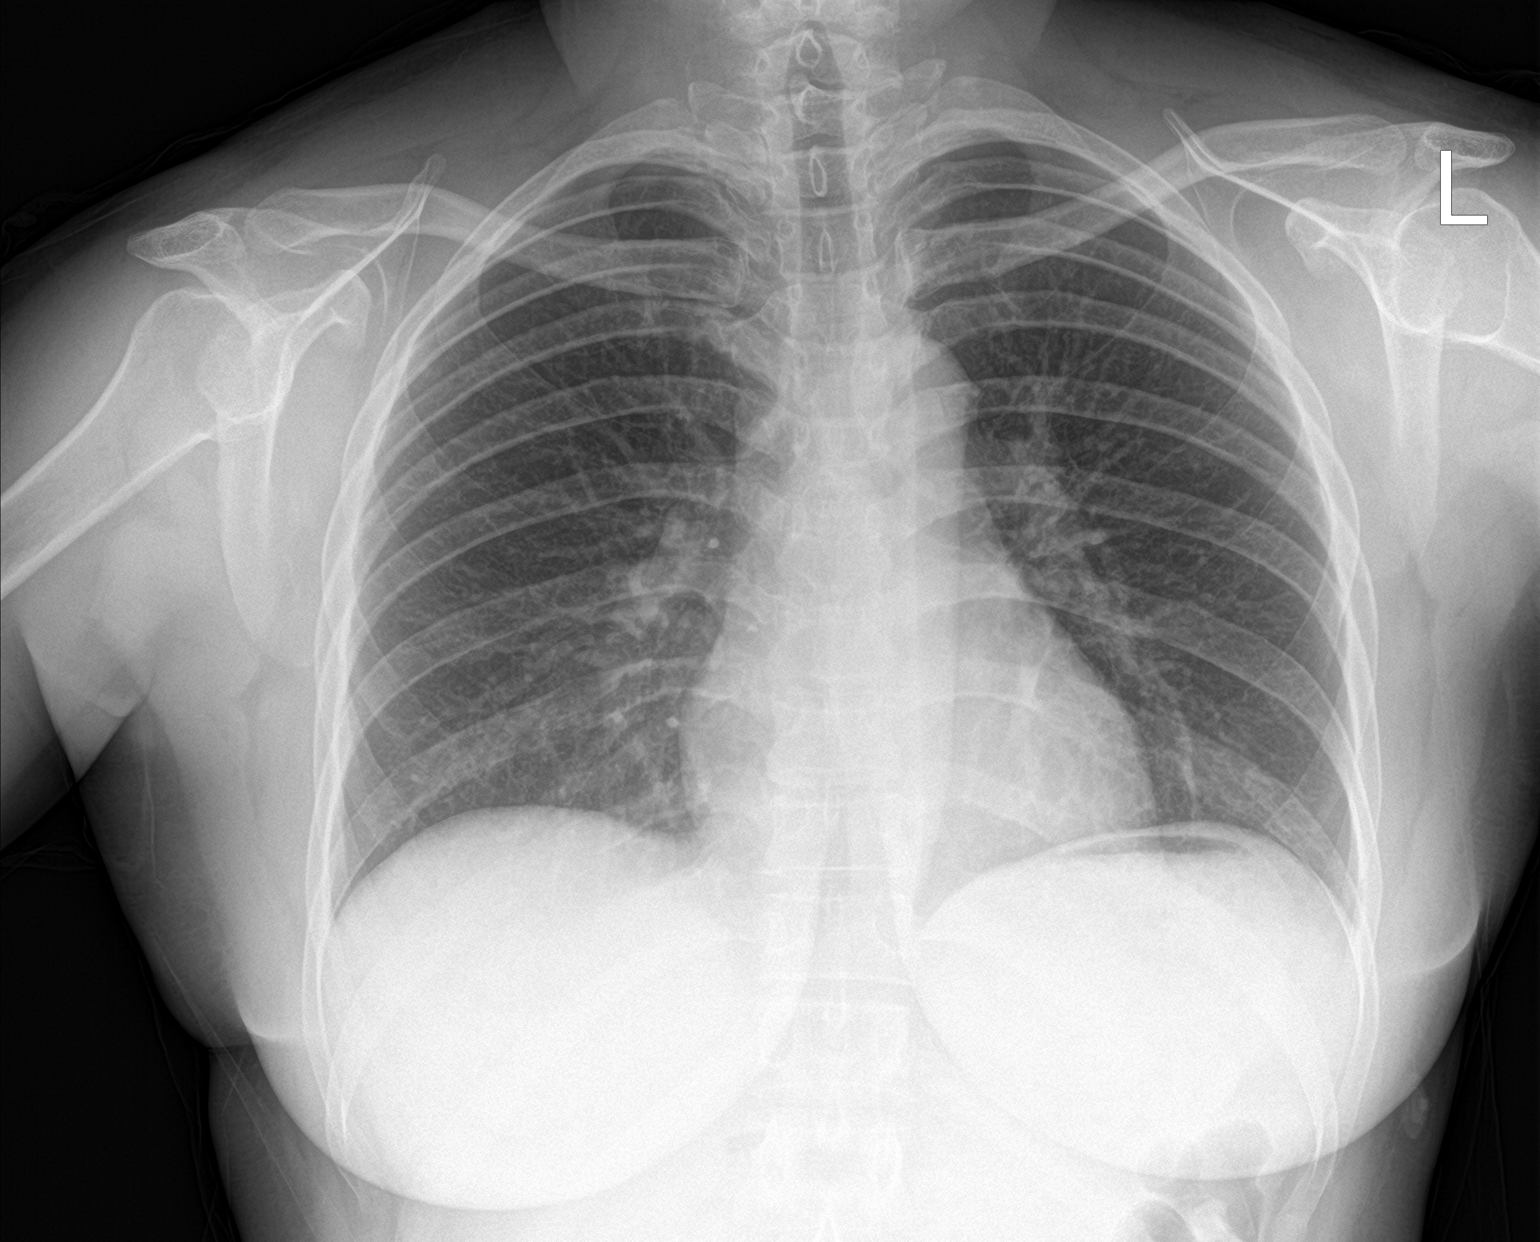

[chest lat]
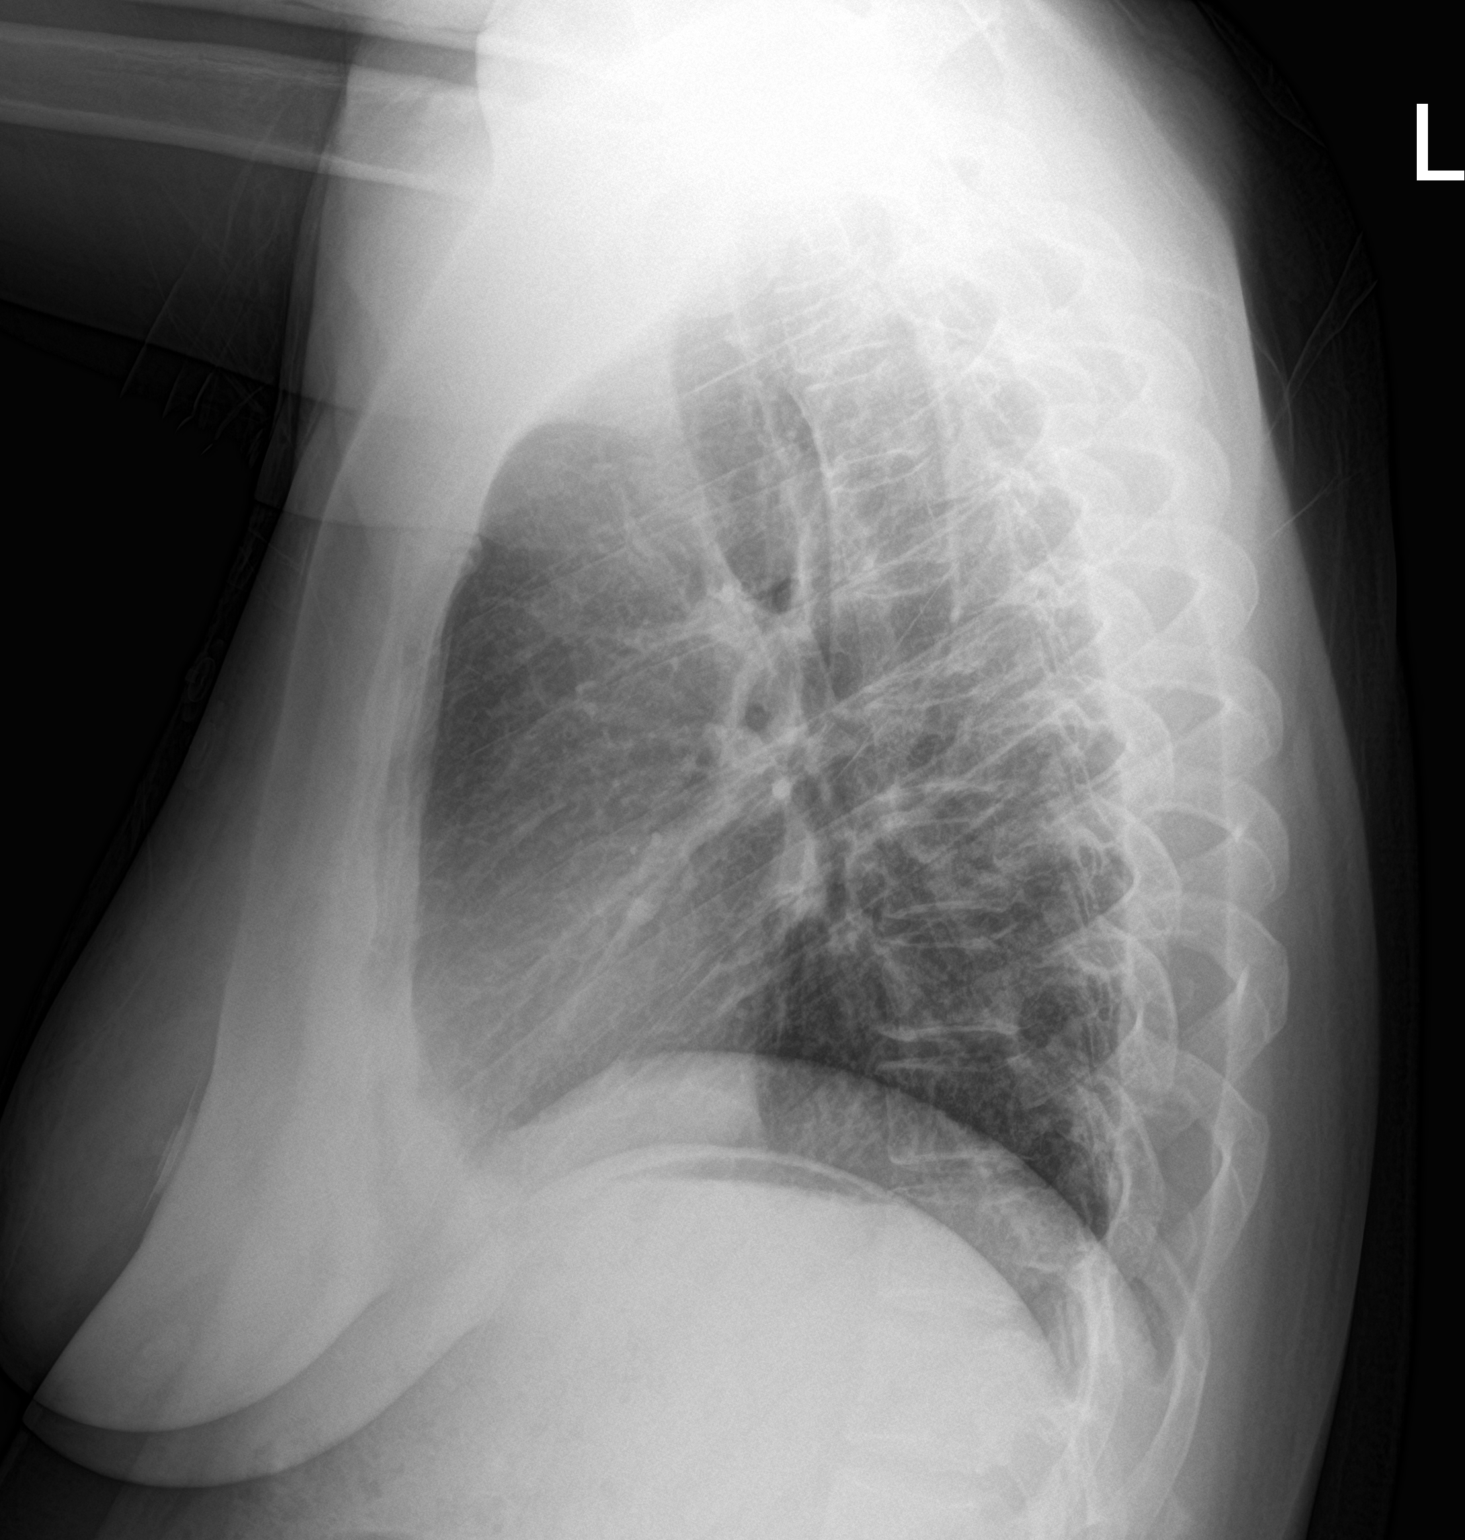

[2 of 2 positions shown; findings below may reference images not displayed]

FINDINGS: The heart size and mediastinal contours are within normal limits.
Both lungs are clear. The visualized skeletal structures are
unremarkable.
IMPRESSION: No active cardiopulmonary disease.

## 2023-06-19 IMAGING — CT CT ABD-PELV W/ CM
2 of 4 series · 16 of 46 positions shown, 18 images · IV contrast (APPLIED)
Comparison: None Available.

CLINICAL DATA: Right lower quadrant pain and nausea.

EXAM:
CT ABDOMEN AND PELVIS WITH CONTRAST
TECHNIQUE: Multidetector CT imaging of the abdomen and pelvis was performed
using the standard protocol following bolus administration of
intravenous contrast.

[Series 3: abdomen 5.0 · axial · 0.86mm/px · z∈[+683,+1098]mm · 13 of 93 slices shown, 15 images]
[im 5/93  soft-tissue]
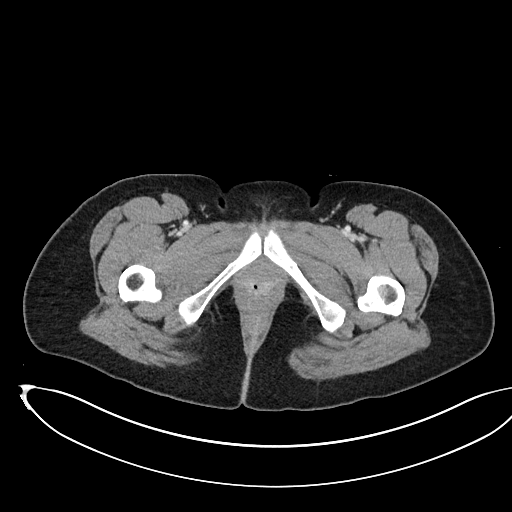
[im 5/93  bone]
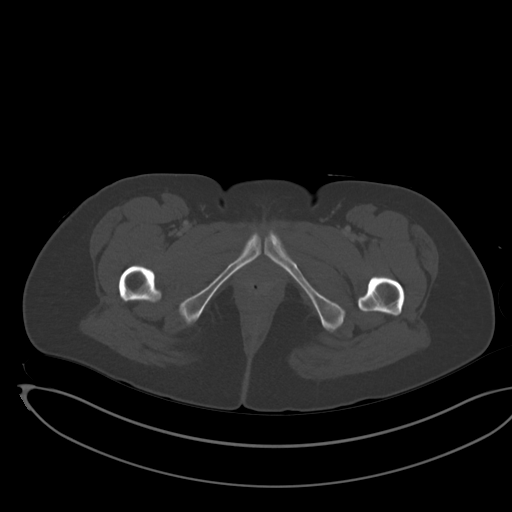
[im 14/93  soft-tissue]
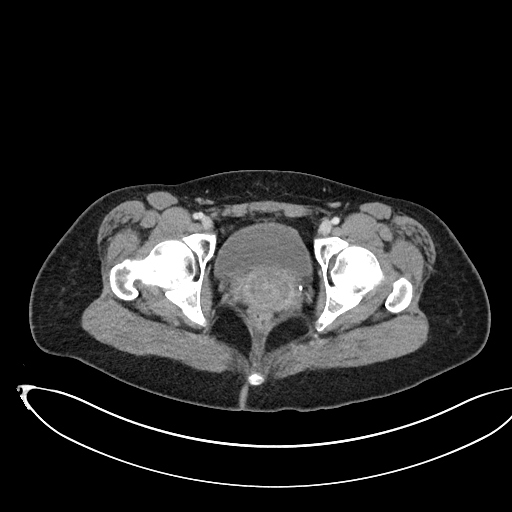
[im 18/93  soft-tissue]
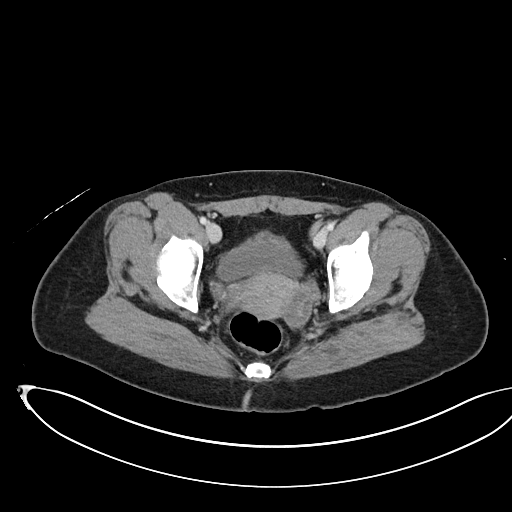
[im 27/93  soft-tissue]
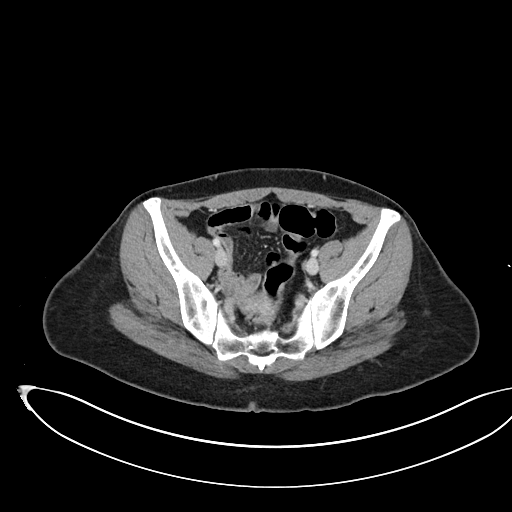
[im 31/93  soft-tissue]
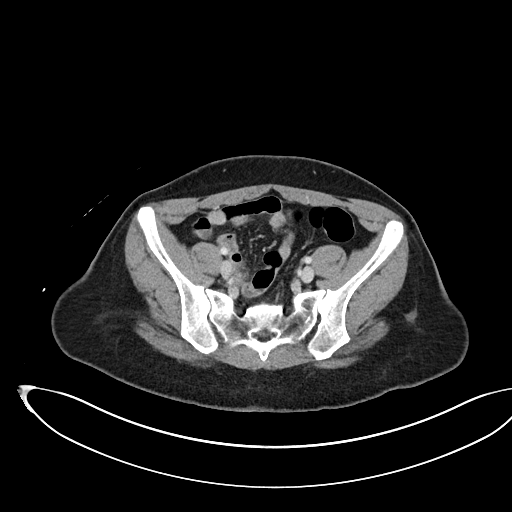
[im 40/93  soft-tissue]
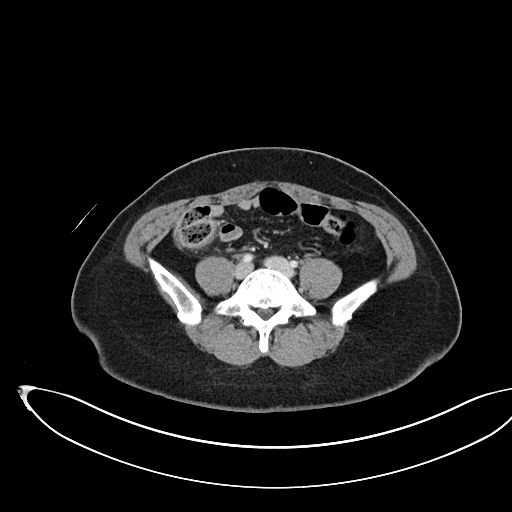
[im 49/93  soft-tissue]
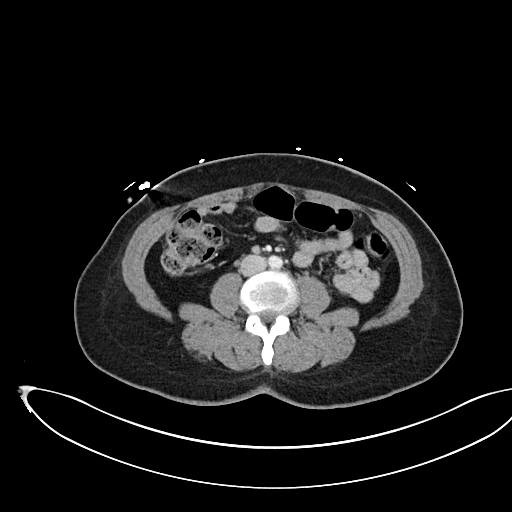
[im 53/93  soft-tissue]
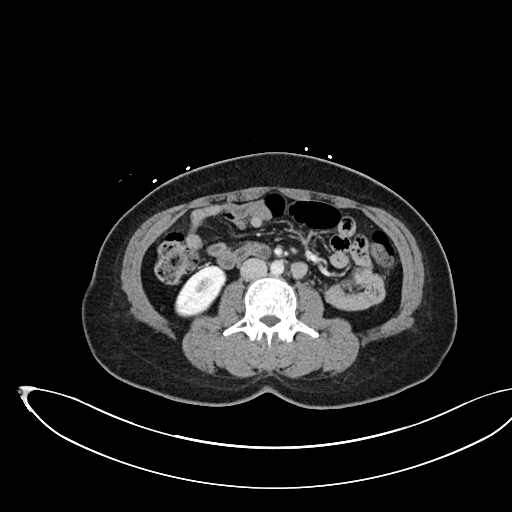
[im 62/93  soft-tissue]
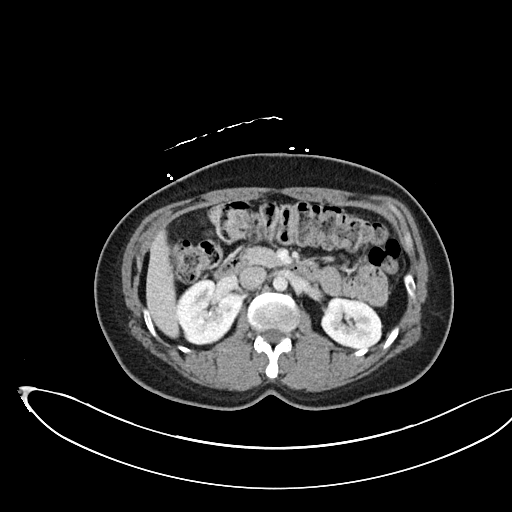
[im 62/93  bone]
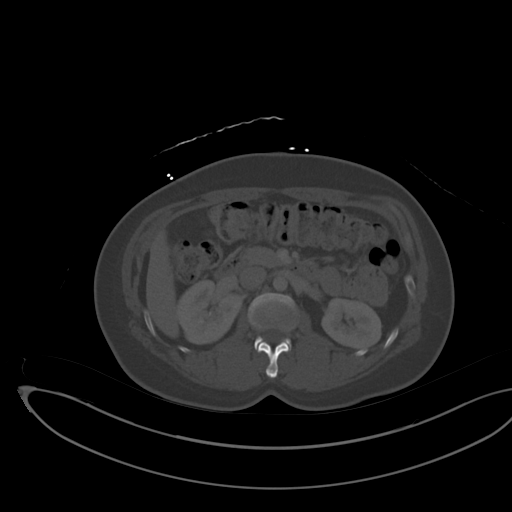
[im 66/93  soft-tissue]
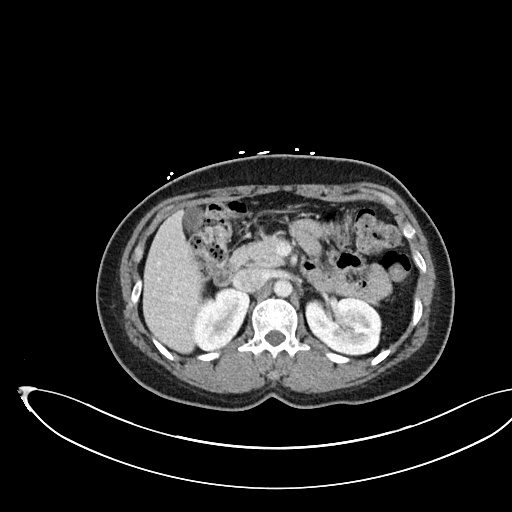
[im 75/93  soft-tissue]
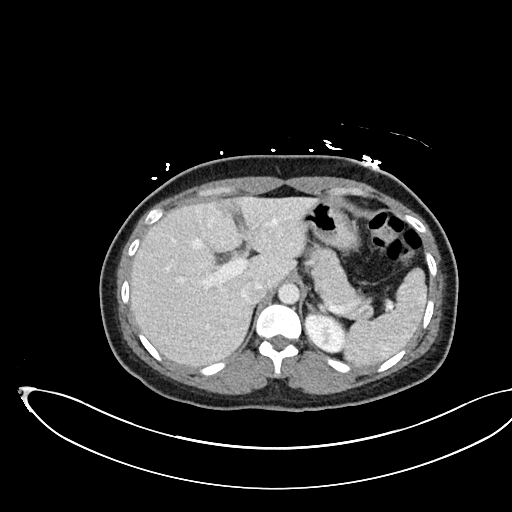
[im 79/93  soft-tissue]
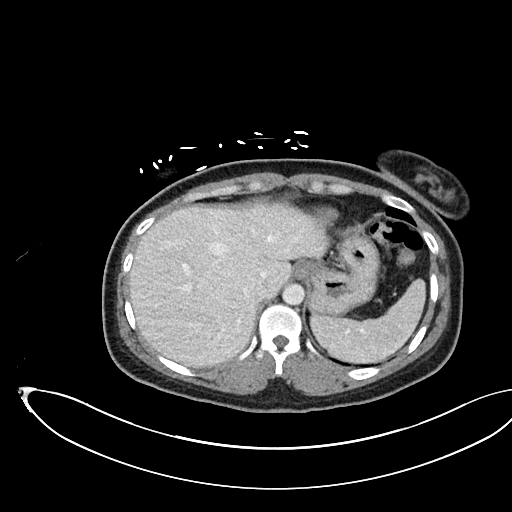
[im 88/93  soft-tissue]
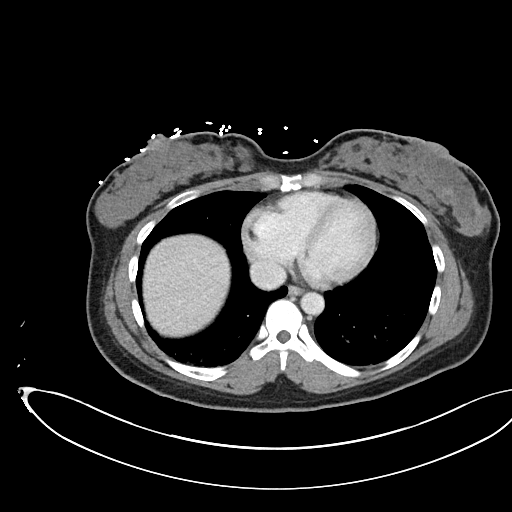

[Series 6: abdomen 3.0 mpr cor · coronal · 0.86mm/px · 3 of 75 slices shown]
[im 25/75  soft-tissue]
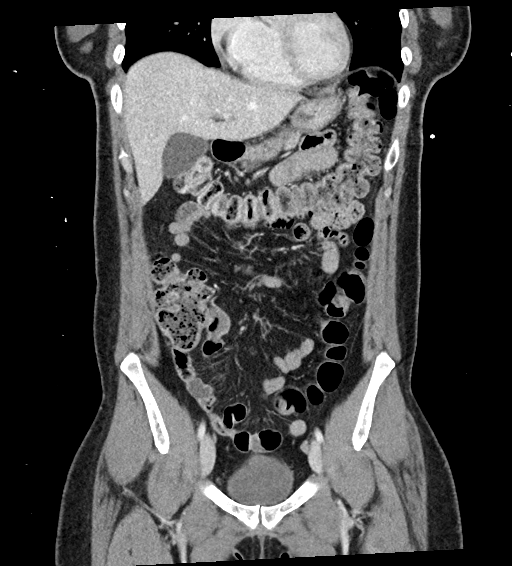
[im 33/75  soft-tissue]
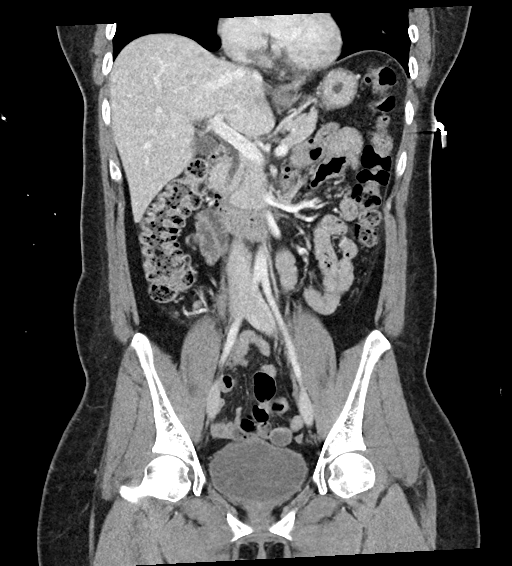
[im 42/75  soft-tissue]
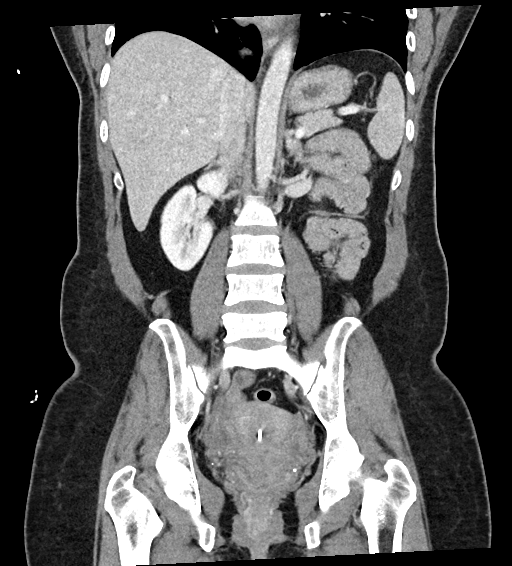

[16 of 46 positions shown; findings below may reference images not displayed]

RADIATION DOSE REDUCTION: This exam was performed according to the
departmental dose-optimization program which includes automated
exposure control, adjustment of the mA and/or kV according to
patient size and/or use of iterative reconstruction technique.

CONTRAST:  80mL OMNIPAQUE IOHEXOL 300 MG/ML  SOLN
FINDINGS: Lower chest: No acute abnormality. Mild passive atelectasis in the
posterior bases. Mild elevation right hemidiaphragm.

Hepatobiliary: No hepatic mass enhancement. No focal abnormality of
the gallbladder wall, lumen and bile ducts.

Pancreas: No focal abnormality or ductal dilatation.

Spleen: No focal abnormality or splenomegaly.

Adrenals/Urinary Tract: There is no adrenal or renal cortical mass
enhancement. There is contrast in both renal collecting systems
which could obscure intrarenal stones if present. No ureteral stones
or hydronephrosis are seen. The bladder is unremarkable.

Stomach/Bowel: Unremarkable stomach, unremarkable unopacified small
bowel. The appendix is enhancing and minimally prominent measuring 7
mm but there are no inflammatory changes. Nevertheless acute
appendicitis could not be excluded if the patient had peritoneal
signs or localizing tenderness.

There is moderate stool retention. There is no evidence of focal
colitis or diverticulitis.

Vascular/Lymphatic: No significant vascular findings are present. No
enlarged abdominal or pelvic lymph nodes.

Reproductive: The uterus is intact and retroverted. There is an IUD
in the expected location within the cavity. The ovaries are
follicular but not enlarged.

Other: Small umbilical fat hernia. There is no free air, hemorrhage
or fluid or incarcerated hernia.

Musculoskeletal: No acute or significant osseous findings. At L4-5
however, there is a left paracentral disc bulge causing mild spinal
canal stenosis and possible mass effect on the left L5 nerve root.
Please correlate clinically.
IMPRESSION: 1. Equivocal findings for appendicitis. The appendix is enhancing
and slightly prominent but there are no inflammatory changes around
it. Clinical correlation advised.
2. Constipation and diverticulosis.
3. IUD.
4. Small umbilical fat hernia.
5. Broad left paracentral disc bulge L4-5 level with possible mass
effect on the L5 nerve root.

## 2023-06-19 IMAGING — US US ART/VEN ABD/PELV/SCROTUM DOPPLER LTD
1 series · 13 of 25 positions shown · non-contrast
Comparison: CT with IV contrast earlier today.

CLINICAL DATA: General pelvic pain.

EXAM:
TRANSABDOMINAL AND TRANSVAGINAL ULTRASOUND OF PELVIS
DOPPLER ULTRASOUND OF OVARIES
TECHNIQUE: Both transabdominal and transvaginal ultrasound examinations of the
pelvis were performed. Transabdominal technique was performed for
global imaging of the pelvis including uterus, ovaries, adnexal
regions, and pelvic cul-de-sac.
It was necessary to proceed with endovaginal exam following the
transabdominal exam to visualize the ovaries and adnexal spaces
better. Color and duplex Doppler ultrasound was utilized to evaluate
blood flow to the ovaries.

[Series 1: us pelvic doppler (torsion right/o or mass arteria · arterial · 13 of 44 slices shown]
[im 1/44]
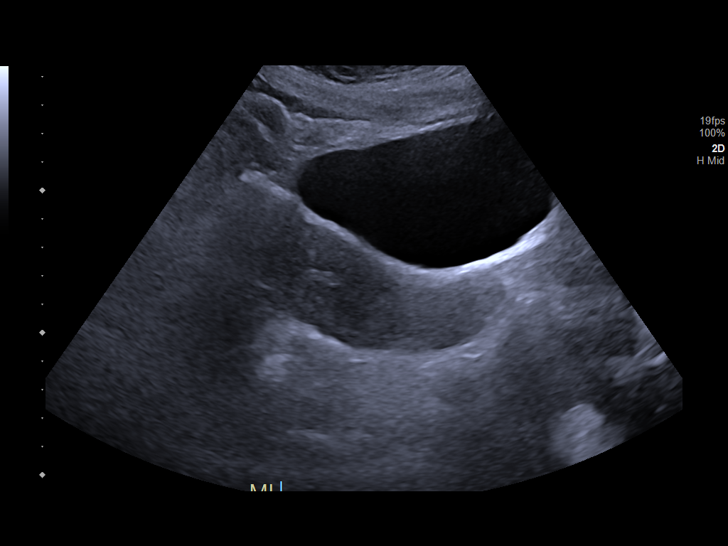
[im 4/44]
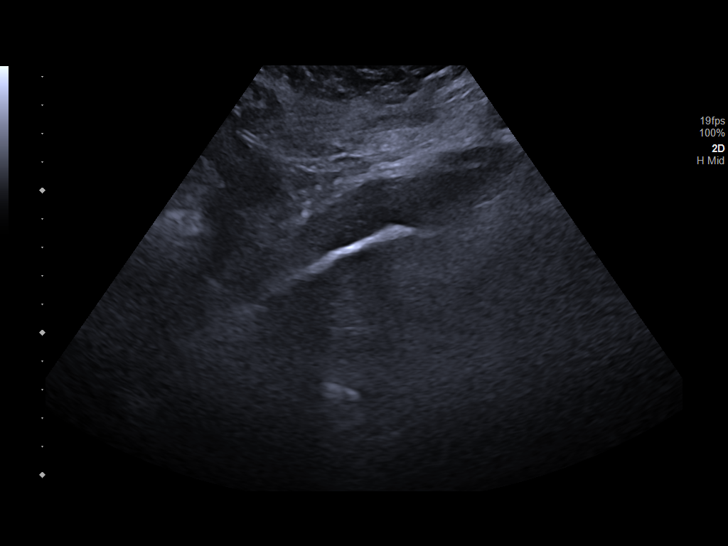
[im 8/44]
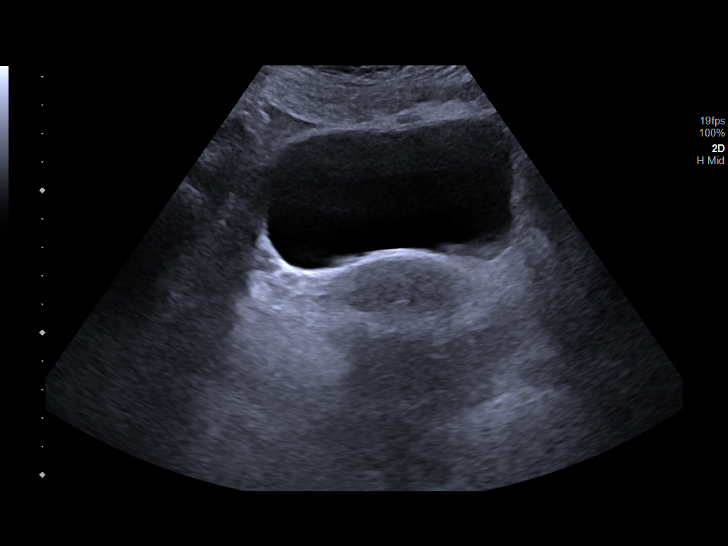
[im 11/44]
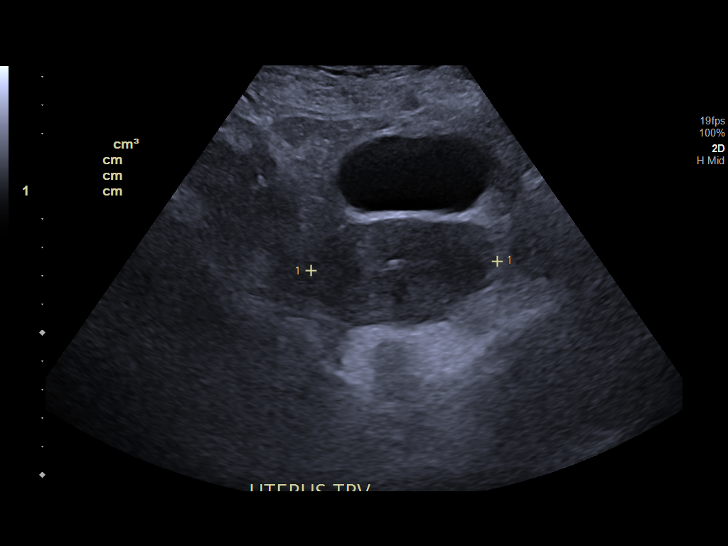
[im 15/44]
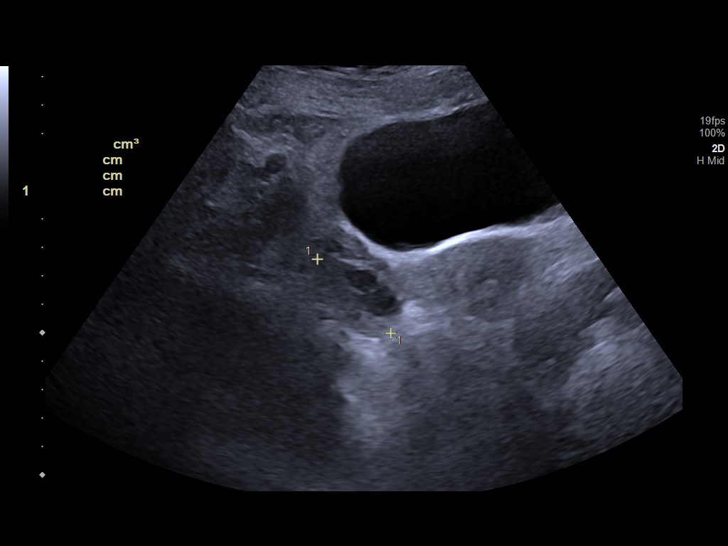
[im 18/44]
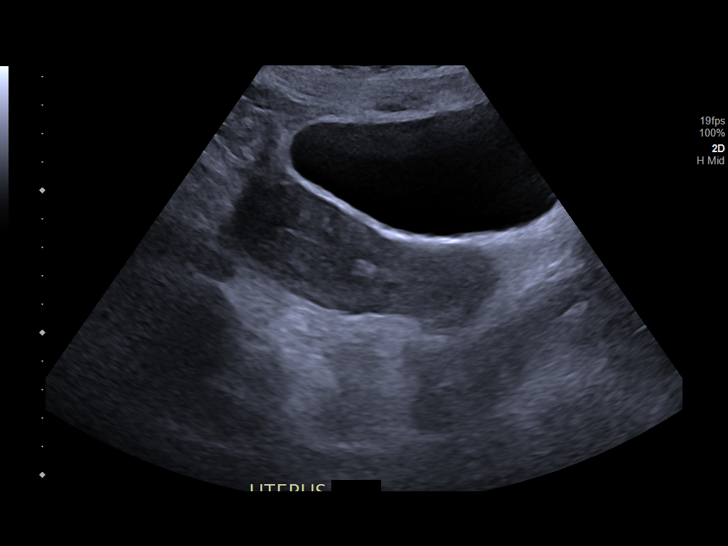
[im 22/44]
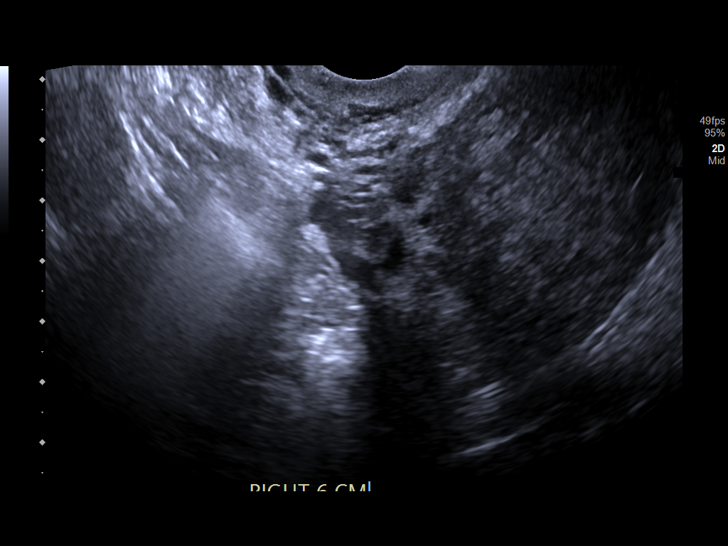
[im 26/44]
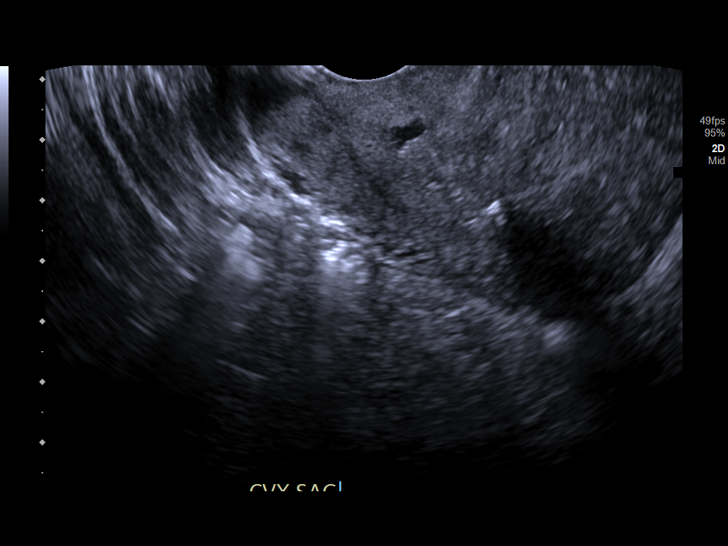
[im 29/44]
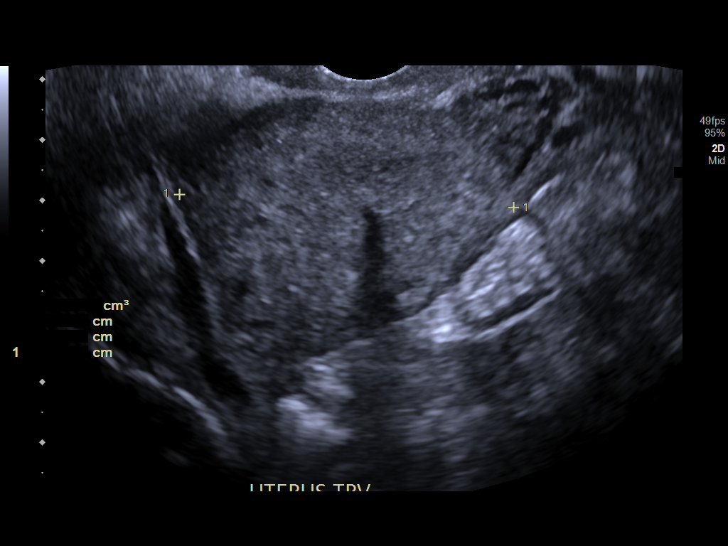
[im 33/44]
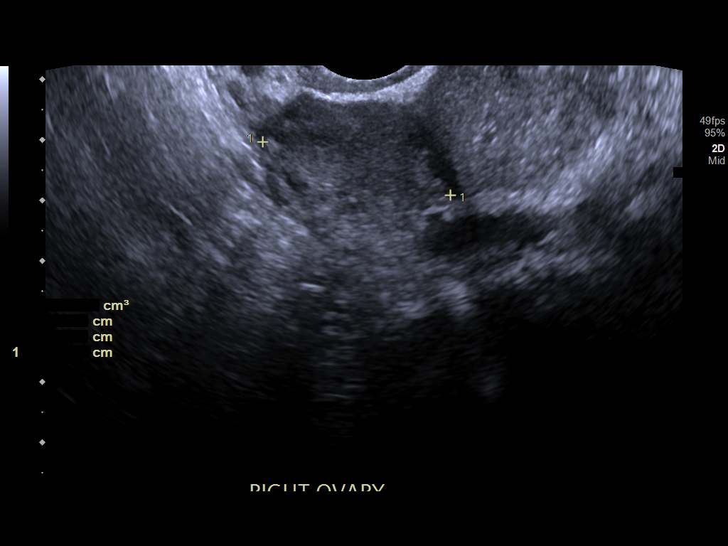
[im 36/44]
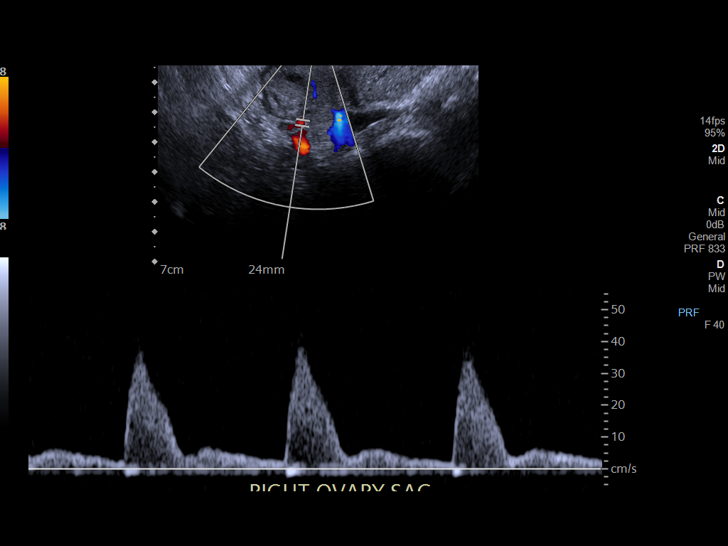
[im 40/44]
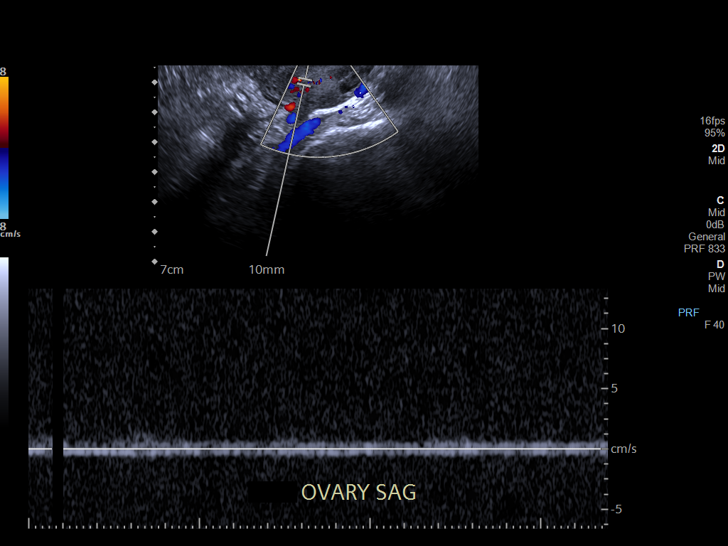
[im 44/44]
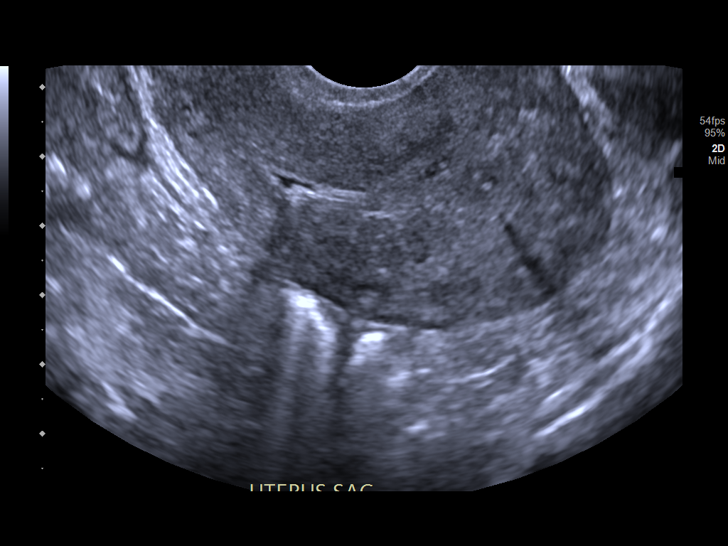

[13 of 25 positions shown; findings below may reference images not displayed]

FINDINGS: Uterus

Measurements: Retroverted measuring 8.2 x 3.2 x 6.6 cm = volume:
90.2 mL. No fibroids or other mass visualized. Unremarkable cervix.

Endometrium

Thickness: 4.1 mm. No focal abnormality visualized. IUD again noted
within the uterus in the expected location in the distal cavity.
There is trace fluid in the uterine cavity.

Right ovary

Measurements: 3.2 x 2.2 x 3.2 cm = volume: 12 mL. Normal
appearance/no adnexal mass.

Left ovary

Measurements: 3.0 x 1.7 x 2.9 cm = volume: 7.5 mL. Normal
appearance/no adnexal mass.

Pulsed Doppler evaluation of both ovaries demonstrates normal
low-resistance arterial and venous waveforms.

Other findings

No abnormal free fluid.
IMPRESSION: No acute sonographic findings. IUD is in place in expected
positioning within the cavity with trace fluid in the uterine
cavity. No evidence of ovarian torsion or mass. No visible fibroids.

## 2023-06-25 ENCOUNTER — Emergency Department (HOSPITAL_BASED_OUTPATIENT_CLINIC_OR_DEPARTMENT_OTHER): Payer: Medicaid Other

## 2023-06-25 ENCOUNTER — Encounter (HOSPITAL_BASED_OUTPATIENT_CLINIC_OR_DEPARTMENT_OTHER): Payer: Self-pay

## 2023-06-25 ENCOUNTER — Emergency Department (HOSPITAL_BASED_OUTPATIENT_CLINIC_OR_DEPARTMENT_OTHER)
Admission: EM | Admit: 2023-06-25 | Discharge: 2023-06-25 | Disposition: A | Discharge status: Home / Self Care | Payer: Medicaid Other | Attending: Emergency Medicine | Admitting: Emergency Medicine

## 2023-06-25 ENCOUNTER — Other Ambulatory Visit: Payer: Self-pay

## 2023-06-25 DIAGNOSIS — R079 Chest pain, unspecified: Secondary | ICD-10-CM | POA: Diagnosis not present

## 2023-06-25 DIAGNOSIS — R051 Acute cough: Secondary | ICD-10-CM | POA: Diagnosis not present

## 2023-06-25 DIAGNOSIS — R519 Headache, unspecified: Secondary | ICD-10-CM | POA: Diagnosis present

## 2023-06-25 DIAGNOSIS — Z20822 Contact with and (suspected) exposure to covid-19: Secondary | ICD-10-CM | POA: Diagnosis not present

## 2023-06-25 DIAGNOSIS — M5412 Radiculopathy, cervical region: Secondary | ICD-10-CM | POA: Diagnosis not present

## 2023-06-25 LAB — BASIC METABOLIC PANEL
Anion gap: 6 (ref 5–15)
BUN: 13 mg/dL (ref 6–20)
CO2: 24 mmol/L (ref 22–32)
Calcium: 8.9 mg/dL (ref 8.9–10.3)
Chloride: 106 mmol/L (ref 98–111)
Creatinine, Ser: 0.69 mg/dL (ref 0.44–1.00)
GFR, Estimated: >60 mL/min (ref >60)
Glucose, Bld: 109 mg/dL — ABNORMAL HIGH (ref 70–99)
Potassium: 4 mmol/L (ref 3.5–5.1)
Sodium: 136 mmol/L (ref 135–145)

## 2023-06-25 LAB — CBC
HCT: 46.9 % — ABNORMAL HIGH (ref 36.0–46.0)
Hemoglobin: 15.1 g/dL — ABNORMAL HIGH (ref 12.0–15.0)
MCH: 25.8 pg — ABNORMAL LOW (ref 26.0–34.0)
MCHC: 32.2 g/dL (ref 30.0–36.0)
MCV: 80.2 fL (ref 80.0–100.0)
Platelets: 338 10*3/uL (ref 150–400)
RBC: 5.85 MIL/uL — ABNORMAL HIGH (ref 3.87–5.11)
RDW: 13.9 % (ref 11.5–15.5)
WBC: 6.9 10*3/uL (ref 4.0–10.5)
nRBC: 0 % (ref 0.0–0.2)

## 2023-06-25 LAB — RESP PANEL BY RT-PCR (RSV, FLU A&B, COVID)  RVPGX2
Influenza A by PCR: NEGATIVE
Influenza B by PCR: NEGATIVE
Resp Syncytial Virus by PCR: NEGATIVE
SARS Coronavirus 2 by RT PCR: NEGATIVE

## 2023-06-25 LAB — HCG, SERUM, QUALITATIVE: Preg, Serum: NEGATIVE

## 2023-06-25 LAB — TROPONIN I (HIGH SENSITIVITY): Troponin I (High Sensitivity): <2 ng/L (ref <18)

## 2023-06-25 MED ORDER — METHOCARBAMOL 500 MG PO TABS: 500.0000 mg | ORAL_TABLET | Freq: Two times a day (BID) | ORAL | 0 refills | Status: AC | PRN

## 2023-06-25 MED ORDER — NAPROXEN 375 MG PO TABS: 375.0000 mg | ORAL_TABLET | Freq: Two times a day (BID) | ORAL | 0 refills | Status: AC

## 2023-06-25 NOTE — ED Triage Notes (Signed)
The patient has had a cough for months. She stated two days ago she started coughing up blood. She has had chest pain for one week. She is also having a headache for one week.

## 2023-06-25 NOTE — ED Notes (Signed)
Patient transported to X-ray and CT 

## 2023-06-25 NOTE — Discharge Instructions (Addendum)
We are prescribing you some medications to help with your headache and neck pain.  Please contact your doctor and reviewed these with them to make sure that it is okay with breast-feeding.  You had x-rays of your chest along with a CAT scan of your head and neck that did not show any serious findings.

## 2023-06-25 NOTE — ED Provider Notes (Signed)
[facility] Provider Note   CSN: [id number] Arrival date & time: 06/25/23  9629     History  Chief Complaint  Patient presents with   Chest Pain   Headache   Cough    [facility] [name] is a 37 y.o. female.  She is here with multiple complaints.  She said she has had a cough for a month.  Few days ago it was productive with some blood in the sputum.  Has not seen any blood since then.  Feels short of breath sometimes.  No fevers.  She is also complaining of a general headache for a few days.  Associated with some discomfort in her neck that radiates down her right arm into her third and fourth fingers.  Comes and goes.  She said she has had this problem before and had multiple tests by her doctor and they never found a reason.  No blurry vision double vision.  Nausea no vomiting.  No recent travel or sick contacts.  The history is provided by the patient.  Headache Pain location:  Generalized Onset quality:  Gradual Timing:  Constant Progression:  Waxing and waning Chronicity:  Recurrent Similar to prior headaches: yes   Relieved by:  None tried Worsened by:  Nothing Ineffective treatments:  None tried Associated symptoms: cough, nausea, neck pain and paresthesias   Associated symptoms: no abdominal pain, no blurred vision, no fever, no focal weakness, no syncope, no vomiting and no weakness   Cough Cough characteristics:  Productive Sputum characteristics:  Bloody Onset quality:  Gradual Timing:  Sporadic Progression:  Unchanged Chronicity:  New Associated symptoms: headaches and shortness of breath   Associated symptoms: no chest pain and no fever        Home Medications Prior to Admission medications   Medication Sig Start Date End Date Taking? Authorizing Provider  acetaminophen (TYLENOL) 500 MG tablet Take 2 tablets (1,000 mg total) by mouth every 8 (eight) hours as needed for mild pain (pain). 12/26/21   Maczis, Elmer Sow, PA-C  amoxicillin (AMOXIL) 500 MG capsule Take 500 mg by mouth 3 (three) times daily. 12/19/21   [provider]  famotidine (PEPCID) 20 MG tablet Take 20 mg by mouth 2 (two) times daily as needed for heartburn. 09/12/18   [provider]  Joylene John 17 GM/SCOOP powder SMARTSIG:17 Gram(s) By Mouth Daily PRN Patient not taking: Reported on 12/26/2021 12/15/21   [provider]  levonorgestrel (MIRENA, 52 MG,) 20 MCG/DAY IUD Mirena 21 mcg/24 hours (8 yrs) 52 mg intrauterine device  [facility]    [provider]  metroNIDAZOLE (FLAGYL) 500 MG tablet Take 1 tablet (500 mg total) by mouth 2 (two) times daily. 04/16/22   Blue, Soijett A, PA-C  ondansetron (ZOFRAN-ODT) 4 MG disintegrating tablet Take 1 tablet (4 mg total) by mouth every 8 (eight) hours as needed for nausea or vomiting. 09/24/22   Mannie Stabile, PA-C  oxyCODONE (ROXICODONE) 5 MG immediate release tablet Take 1 tablet (5 mg total) by mouth every 6 (six) hours as needed for breakthrough pain. 12/26/21   Maczis, Elmer Sow, PA-C  Vitamin D, Ergocalciferol, (DRISDOL) 1.25 MG (50000 UNIT) CAPS capsule Take 1 capsule (50,000 Units total) by mouth every 7 (seven) days. Patient not taking: Reported on 12/26/2021 10/01/21   Levert Feinstein, MD      Allergies    Patient has no known allergies.    Review of Systems  Review of Systems  Constitutional:  Negative for fever.  Eyes:  Negative for blurred vision and visual disturbance.  Respiratory:  Positive for cough and shortness of breath.   Cardiovascular:  Negative for chest pain and syncope.  Gastrointestinal:  Positive for nausea. Negative for abdominal pain and vomiting.  Musculoskeletal:  Positive for neck pain.  Neurological:  Positive for headaches and paresthesias. Negative for focal weakness and weakness.    Physical Exam Updated Vital Signs BP 124/82   Pulse 79   Temp 97.7 F (36.5 C) (Oral)   Resp 18   Ht 5' (1.524 m)   Wt 68 kg    SpO2 98%   Breastfeeding Yes   BMI 29.29 kg/m  Physical Exam Vitals and nursing note reviewed.  Constitutional:      General: She is not in acute distress.    Appearance: Normal appearance. She is well-developed.  HENT:     Head: Normocephalic and atraumatic.  Eyes:     Conjunctiva/sclera: Conjunctivae normal.  Cardiovascular:     Rate and Rhythm: Normal rate and regular rhythm.     Heart sounds: Normal heart sounds. No murmur heard. Pulmonary:     Effort: Pulmonary effort is normal. No respiratory distress.     Breath sounds: Normal breath sounds.  Abdominal:     Palpations: Abdomen is soft.     Tenderness: There is no abdominal tenderness.  Musculoskeletal:        General: No swelling. Normal range of motion.     Cervical back: Neck supple. No rigidity.     Right lower leg: No tenderness. No edema.     Left lower leg: No tenderness. No edema.  Skin:    General: Skin is warm and dry.     Capillary Refill: Capillary refill takes less than 2 seconds.  Neurological:     General: No focal deficit present.     Mental Status: She is alert.     Cranial Nerves: No cranial nerve deficit or dysarthria.     Sensory: No sensory deficit.     Motor: No weakness.     Gait: Gait normal.  Psychiatric:        Mood and Affect: Mood normal.     ED Results / Procedures / Treatments   Labs (all labs ordered are listed, but only abnormal results are displayed) Labs Reviewed  BASIC METABOLIC PANEL - Abnormal; Notable for the following components:      Result Value   Glucose, Bld 109 (*)    All other components within normal limits  CBC - Abnormal; Notable for the following components:   RBC 5.85 (*)    Hemoglobin 15.1 (*)    HCT 46.9 (*)    MCH 25.8 (*)    All other components within normal limits  RESP PANEL BY RT-PCR (RSV, FLU A&B, COVID)  [id number]  HCG, SERUM, QUALITATIVE  TROPONIN I (HIGH SENSITIVITY)    EKG EKG Interpretation Date/Time:  Friday June 25 2023 09:35:43  EST Ventricular Rate:  67 PR Interval:  175 QRS Duration:  109 QT Interval:  383 QTC Calculation: 405 R Axis:   78  Text Interpretation: Sinus rhythm RSR' in V1 or V2, right VCD or RVH No significant change since prior 3/24 Confirmed by Meridee Score [phone]) on 06/25/2023 9:41:13 AM  Radiology DG Chest 2 View Result Date: 06/25/2023 CLINICAL DATA:  Cough. EXAM: CHEST - 2 VIEW COMPARISON:  09/24/2022. FINDINGS: Bilateral lung fields are clear. Bilateral costophrenic angles are  clear. Normal cardio-mediastinal silhouette. No acute osseous abnormalities. The soft tissues are within normal limits. IMPRESSION: No active cardiopulmonary disease. Electronically Signed   By: Jules Schick M.D.   On: 06/25/2023 11:09   CT Cervical Spine Wo Contrast Result Date: 06/25/2023 CLINICAL DATA:  Cough for 2 months. Hemoptysis beginning 2 days ago. Chest pain. EXAM: CT CERVICAL SPINE WITHOUT CONTRAST TECHNIQUE: Multidetector CT imaging of the cervical spine was performed without intravenous contrast. Multiplanar CT image reconstructions were also generated. RADIATION DOSE REDUCTION: This exam was performed according to the departmental dose-optimization program which includes automated exposure control, adjustment of the mA and/or kV according to patient size and/or use of iterative reconstruction technique. COMPARISON:  MRI of the cervical spine 09/11/2021. FINDINGS: Alignment: No significant listhesis is present. Straightening and slight reversal of the normal cervical lordosis is stable. Skull base and vertebrae: The craniocervical junction is normal. The vertebral body heights are normal. Acute or healing fractures are present. Soft tissues and spinal canal: No prevertebral fluid or swelling. No visible canal hematoma. Disc levels: Normal disc signal and height is present. No focal protrusion or stenosis is present. Upper chest: The lung apices are clear. The thoracic inlet is within normal limits. IMPRESSION:  1. No acute or healing fractures. 2. Straightening and slight reversal of the normal cervical lordosis is stable. This may be positional or secondary to muscle spasm. 3. No focal protrusion or stenosis. Electronically Signed   By: Marin Roberts M.D.   On: 06/25/2023 10:40   CT Head Wo Contrast Result Date: 06/25/2023 CLINICAL DATA:  Headache, increasing frequency or severity. Headache for 1 week. Chest pain and hemoptysis. EXAM: CT HEAD WITHOUT CONTRAST TECHNIQUE: Contiguous axial images were obtained from the base of the skull through the vertex without intravenous contrast. RADIATION DOSE REDUCTION: This exam was performed according to the departmental dose-optimization program which includes automated exposure control, adjustment of the mA and/or kV according to patient size and/or use of iterative reconstruction technique. COMPARISON:  CT head without contrast 01/31/2020 FINDINGS: Brain: No acute infarct, hemorrhage, or mass lesion is present. No significant white matter lesions are present. Incidental note is made of a cavum septum pellucidum. Deep brain nuclei are within normal limits. Size is normal. No significant extraaxial fluid collection is present. The brainstem and cerebellum are within normal limits. Midline structures are within normal limits. Vascular: No hyperdense vessel or unexpected calcification. Skull: Calvarium is intact. No focal lytic or blastic lesions are present. No significant extracranial soft tissue lesion is present. Sinuses/Orbits: The paranasal sinuses and mastoid air cells are clear. The globes and orbits are within normal limits. IMPRESSION: Normal CT appearance of the brain. No acute or focal lesion to explain the patient's symptoms. Electronically Signed   By: Marin Roberts M.D.   On: 06/25/2023 10:38    Procedures Procedures    Medications Ordered in ED Medications - No data to display  ED Course/ Medical Decision Making/ A&P Clinical Course as of  06/25/23 1738  Fri Jun 25, 2023  1041 X-ray interpreted by me as no acute infiltrates.  Awaiting radiology reading. [MB]    Clinical Course User Index [MB] Terrilee Files, MD                                 Medical Decision Making Amount and/or Complexity of Data Reviewed Labs: ordered. Radiology: ordered.  Risk Prescription drug management.   This patient complains  of headache, neck pain radiating down into her right hand, cough with some small amount of blood; this involves an extensive number of treatment Options and is a complaint that carries with it a high risk of complications and morbidity. The differential includes pneumonia, bronchitis, TB, headache, radiculopathy, stroke, bleed  I ordered, reviewed and interpreted labs, which included CBC unremarkable chemistries unremarkable troponins flat pregnancy negative COVID and flu negative I ordered imaging studies which included CT head and cervical spine, chest x-ray and I independently    visualized and interpreted imaging which showed no acute findings Previous records obtained and reviewed in epic, patient has been seen before for nonspecific chest pain and cough, radicular symptoms Cardiac monitoring reviewed, normal sinus rhythm Social determinants considered, no significant barriers Critical Interventions: None  After the interventions stated above, I reevaluated the patient and found patient to be well-appearing in no distress with stable vitals Admission and further testing considered, no indications for admission or further workup at this time.  Will trial her on some NSAIDs muscle relaxants.  Recommended close follow-up with PCP.         Final Clinical Impression(s) / ED Diagnoses Final diagnoses:  Bad headache  Cervical radicular pain  Acute cough    Rx / DC Orders ED Discharge Orders          Ordered    naproxen (NAPROSYN) 375 MG tablet  2 times daily        06/25/23 1110    methocarbamol  (ROBAXIN) 500 MG tablet  2 times daily PRN        06/25/23 1110              Terrilee Files, MD 06/25/23 1740

## 2024-02-01 ENCOUNTER — Other Ambulatory Visit: Payer: Self-pay

## 2024-02-01 ENCOUNTER — Encounter (HOSPITAL_BASED_OUTPATIENT_CLINIC_OR_DEPARTMENT_OTHER): Payer: Self-pay | Admitting: Radiology

## 2024-02-01 ENCOUNTER — Emergency Department (HOSPITAL_BASED_OUTPATIENT_CLINIC_OR_DEPARTMENT_OTHER)

## 2024-02-01 ENCOUNTER — Emergency Department (HOSPITAL_BASED_OUTPATIENT_CLINIC_OR_DEPARTMENT_OTHER)
Admission: EM | Admit: 2024-02-01 | Discharge: 2024-02-01 | Disposition: A | Discharge status: Home / Self Care | Attending: Emergency Medicine | Admitting: Emergency Medicine

## 2024-02-01 DIAGNOSIS — N3000 Acute cystitis without hematuria: Secondary | ICD-10-CM | POA: Insufficient documentation

## 2024-02-01 DIAGNOSIS — K219 Gastro-esophageal reflux disease without esophagitis: Secondary | ICD-10-CM | POA: Diagnosis not present

## 2024-02-01 DIAGNOSIS — R079 Chest pain, unspecified: Secondary | ICD-10-CM | POA: Diagnosis present

## 2024-02-01 DIAGNOSIS — R0789 Other chest pain: Secondary | ICD-10-CM

## 2024-02-01 LAB — BASIC METABOLIC PANEL WITH GFR
Anion gap: 9 (ref 5–15)
BUN: 12 mg/dL (ref 6–20)
CO2: 25 mmol/L (ref 22–32)
Calcium: 8.6 mg/dL — ABNORMAL LOW (ref 8.9–10.3)
Chloride: 106 mmol/L (ref 98–111)
Creatinine, Ser: 0.79 mg/dL (ref 0.44–1.00)
GFR, Estimated: >60 mL/min (ref >60)
Glucose, Bld: 75 mg/dL (ref 70–99)
Potassium: 3.8 mmol/L (ref 3.5–5.1)
Sodium: 139 mmol/L (ref 135–145)

## 2024-02-01 LAB — URINALYSIS, ROUTINE W REFLEX MICROSCOPIC
Bilirubin Urine: NEGATIVE
Glucose, UA: NEGATIVE mg/dL
Ketones, ur: NEGATIVE mg/dL
Leukocytes,Ua: NEGATIVE
Nitrite: NEGATIVE
Protein, ur: NEGATIVE mg/dL
Specific Gravity, Urine: 1.02 (ref 1.005–1.030)
pH: 7 (ref 5.0–8.0)

## 2024-02-01 LAB — CBC
HCT: 42.5 % (ref 36.0–46.0)
Hemoglobin: 13.6 g/dL (ref 12.0–15.0)
MCH: 25.4 pg — ABNORMAL LOW (ref 26.0–34.0)
MCHC: 32 g/dL (ref 30.0–36.0)
MCV: 79.3 fL — ABNORMAL LOW (ref 80.0–100.0)
Platelets: 277 K/uL (ref 150–400)
RBC: 5.36 MIL/uL — ABNORMAL HIGH (ref 3.87–5.11)
RDW: 14.6 % (ref 11.5–15.5)
WBC: 6.4 K/uL (ref 4.0–10.5)
nRBC: 0 % (ref 0.0–0.2)

## 2024-02-01 LAB — PREGNANCY, URINE: Preg Test, Ur: NEGATIVE

## 2024-02-01 LAB — URINALYSIS, MICROSCOPIC (REFLEX): WBC, UA: NONE SEEN WBC/hpf (ref 0–5)

## 2024-02-01 LAB — LIPASE, BLOOD: Lipase: 76 U/L — ABNORMAL HIGH (ref 11–51)

## 2024-02-01 LAB — TROPONIN T, HIGH SENSITIVITY: Troponin T High Sensitivity: <6 ng/L (ref <19)

## 2024-02-01 MED ORDER — CEPHALEXIN 500 MG PO CAPS: 500.0000 mg | ORAL_CAPSULE | Freq: Four times a day (QID) | ORAL | 0 refills | Status: AC

## 2024-02-01 MED ORDER — SODIUM CHLORIDE 0.9 % IV BOLUS
1000.0000 mL | Freq: Once | INTRAVENOUS | Status: AC
  Administered 2024-02-01: 1000 mL via INTRAVENOUS

## 2024-02-01 MED ORDER — FENTANYL CITRATE PF 50 MCG/ML IJ SOSY
25.0000 ug | PREFILLED_SYRINGE | Freq: Once | INTRAMUSCULAR | Status: AC
  Administered 2024-02-01: 25 ug via INTRAVENOUS
  Filled 2024-02-01: qty 1

## 2024-02-01 MED ORDER — ALUM & MAG HYDROXIDE-SIMETH 200-200-20 MG/5ML PO SUSP
30.0000 mL | Freq: Once | ORAL | Status: AC
  Administered 2024-02-01: 30 mL via ORAL
  Filled 2024-02-01: qty 30

## 2024-02-01 MED ORDER — IOHEXOL 300 MG/ML  SOLN
100.0000 mL | Freq: Once | INTRAMUSCULAR | Status: AC | PRN
  Administered 2024-02-01: 100 mL via INTRAVENOUS

## 2024-02-01 MED ORDER — PANTOPRAZOLE SODIUM 20 MG PO TBEC: 20.0000 mg | DELAYED_RELEASE_TABLET | Freq: Every day | ORAL | 0 refills | Status: AC

## 2024-02-01 NOTE — ED Provider Notes (Signed)
 [facility] Provider Note   CSN: [id number] Arrival date & time: 02/01/24  1744     Patient presents with: Chest Pain   Dana [name] is a 38 y.o. female.  Patient past history significant for H. pylori infection, GERD, cervical radiculopathy who presents to the emergency department concerns of chest pain.  She reports over the last 2 days, she has had a burning type feeling in the epigastrium with extension towards the central/mid sternum of the chest.  States that she was previously on medications for acid reflux that she feels are not currently working nearly as well.  No history of pancreatic abnormalities denies any significant alcohol use.  No reported nausea, vomiting, or shortness of breath.   Chest Pain      Prior to Admission medications   Medication Sig Start Date End Date Taking? Authorizing Provider  cephALEXin  (KEFLEX ) 500 MG capsule Take 1 capsule (500 mg total) by mouth 4 (four) times daily for 7 days. 02/01/24 02/08/24 Yes Lakie Mclouth A, PA-C  pantoprazole  (PROTONIX ) 20 MG tablet Take 1 tablet (20 mg total) by mouth daily. 02/01/24 03/02/24 Yes Giovannina Mun A, PA-C  acetaminophen  (TYLENOL ) 500 MG tablet Take 2 tablets (1,000 mg total) by mouth every 8 (eight) hours as needed for mild pain (pain). 12/26/21   Maczis, Michael M, PA-C  amoxicillin  (AMOXIL ) 500 MG capsule Take 500 mg by mouth 3 (three) times daily. 12/19/21   [provider]  famotidine  (PEPCID ) 20 MG tablet Take 20 mg by mouth 2 (two) times daily as needed for heartburn. 09/12/18   [provider]  STANFORD 17 GM/SCOOP powder SMARTSIG:17 Gram(s) By Mouth Daily PRN Patient not taking: Reported on 12/26/2021 12/15/21   [provider]  levonorgestrel (MIRENA, 52 MG,) 20 MCG/DAY IUD Mirena 21 mcg/24 hours (8 yrs) 52 mg intrauterine device  [facility]    [provider]  methocarbamol  (ROBAXIN ) 500 MG tablet Take 1 tablet  (500 mg total) by mouth 2 (two) times daily as needed for muscle spasms. 06/25/23   Towana Ozell BROCKS, MD  metroNIDAZOLE  (FLAGYL ) 500 MG tablet Take 1 tablet (500 mg total) by mouth 2 (two) times daily. 04/16/22   Blue, Soijett A, PA-C  naproxen  (NAPROSYN ) 375 MG tablet Take 1 tablet (375 mg total) by mouth 2 (two) times daily. 06/25/23   Towana Ozell BROCKS, MD  ondansetron  (ZOFRAN -ODT) 4 MG disintegrating tablet Take 1 tablet (4 mg total) by mouth every 8 (eight) hours as needed for nausea or vomiting. 09/24/22   Aberman, Caroline C, PA-C  oxyCODONE  (ROXICODONE ) 5 MG immediate release tablet Take 1 tablet (5 mg total) by mouth every 6 (six) hours as needed for breakthrough pain. 12/26/21   Maczis, Michael M, PA-C  Vitamin D , Ergocalciferol , (DRISDOL ) 1.25 MG (50000 UNIT) CAPS capsule Take 1 capsule (50,000 Units total) by mouth every 7 (seven) days. Patient not taking: Reported on 12/26/2021 10/01/21   Onita Duos, MD    Allergies: Patient has no known allergies.    Review of Systems  Cardiovascular:  Positive for chest pain.  All other systems reviewed and are negative.   Updated Vital Signs BP 107/72   Pulse 71   Temp 98.1 F (36.7 C) (Oral)   Resp 17   Ht 5' (1.524 m)   Wt 68 kg   SpO2 99%   BMI 29.29 kg/m   Physical Exam Vitals and nursing note reviewed.  Constitutional:  General: She is not in acute distress.    Appearance: She is well-developed.  HENT:     Head: Normocephalic and atraumatic.  Eyes:     Conjunctiva/sclera: Conjunctivae normal.  Cardiovascular:     Rate and Rhythm: Normal rate and regular rhythm.     Heart sounds: No murmur heard. Pulmonary:     Effort: Pulmonary effort is normal. No respiratory distress.     Breath sounds: Normal breath sounds. No decreased breath sounds, wheezing, rhonchi or rales.  Chest:     Chest wall: Tenderness present.     Comments: Reproducible chest wall tenderness towards the left upper chest. Abdominal:     Palpations:  Abdomen is soft.     Tenderness: There is no abdominal tenderness.  Musculoskeletal:        General: No swelling.     Cervical back: Neck supple.  Skin:    General: Skin is warm and dry.     Capillary Refill: Capillary refill takes less than 2 seconds.  Neurological:     Mental Status: She is alert.  Psychiatric:        Mood and Affect: Mood normal.     (all labs ordered are listed, but only abnormal results are displayed) Labs Reviewed  BASIC METABOLIC PANEL WITH GFR - Abnormal; Notable for the following components:      Result Value   Calcium  8.6 (*)    All other components within normal limits  CBC - Abnormal; Notable for the following components:   RBC 5.36 (*)    MCV 79.3 (*)    MCH 25.4 (*)    All other components within normal limits  LIPASE, BLOOD - Abnormal; Notable for the following components:   Lipase 76 (*)    All other components within normal limits  URINALYSIS, ROUTINE W REFLEX MICROSCOPIC - Abnormal; Notable for the following components:   Hgb urine dipstick TRACE (*)    All other components within normal limits  URINALYSIS, MICROSCOPIC (REFLEX) - Abnormal; Notable for the following components:   Bacteria, UA RARE (*)    All other components within normal limits  URINE CULTURE  PREGNANCY, URINE  TROPONIN T, HIGH SENSITIVITY    EKG: None  Radiology: CT ABDOMEN PELVIS W CONTRAST Result Date: 02/01/2024 CLINICAL DATA:  Abdomen pain EXAM: CT ABDOMEN AND PELVIS WITH CONTRAST TECHNIQUE: Multidetector CT imaging of the abdomen and pelvis was performed using the standard protocol following bolus administration of intravenous contrast. RADIATION DOSE REDUCTION: This exam was performed according to the departmental dose-optimization program which includes automated exposure control, adjustment of the mA and/or kV according to patient size and/or use of iterative reconstruction technique. CONTRAST:  OMNIPAQUE  IOHEXOL  300 MG/ML  SOLN COMPARISON:  CT 09/24/2022  FINDINGS: Lower chest: Lung bases demonstrate no acute airspace disease. Hepatobiliary: No focal liver abnormality is seen. No gallstones, gallbladder wall thickening, or biliary dilatation. Pancreas: Unremarkable. No pancreatic ductal dilatation or surrounding inflammatory changes. Spleen: Normal in size without focal abnormality. Adrenals/Urinary Tract: Adrenal glands are unremarkable. Kidneys are normal, without renal calculi, focal lesion, or hydronephrosis. Bladder is slightly thick walled. Stomach/Bowel: Stomach is within normal limits. Appendectomy. No evidence of bowel wall thickening, distention, or inflammatory changes. Vascular/Lymphatic: No significant vascular findings are present. No enlarged abdominal or pelvic lymph nodes. Reproductive: IUD in the uterus.  No adnexal mass Other: Negative for pelvic effusion or free air. Musculoskeletal: No acute or suspicious osseous abnormality. IMPRESSION: 1. No CT evidence for acute intra-abdominal or pelvic abnormality.  2. Slightly thick walled bladder, consider correlation with urinalysis to exclude cystitis. Electronically Signed   By: Luke Bun M.D.   On: 02/01/2024 21:30   DG Chest 2 View Result Date: 02/01/2024 CLINICAL DATA:  Chest pain EXAM: CHEST - 2 VIEW COMPARISON:  06/25/2023 FINDINGS: The heart size and mediastinal contours are within normal limits. Both lungs are clear. The visualized skeletal structures are unremarkable. IMPRESSION: No active cardiopulmonary disease. Electronically Signed   By: Luke Bun M.D.   On: 02/01/2024 18:54     Procedures   Medications Ordered in the ED  alum & mag hydroxide-simeth (MAALOX/MYLANTA) 200-200-20 MG/5ML suspension 30 mL (30 mLs Oral Given 02/01/24 1854)  fentaNYL  (SUBLIMAZE ) injection 25 mcg (25 mcg Intravenous Given 02/01/24 1855)  sodium chloride  0.9 % bolus 1,000 mL (0 mLs Intravenous Stopped 02/01/24 2300)  iohexol  (OMNIPAQUE ) 300 MG/ML solution 100 mL (100 mLs Intravenous Contrast Given  02/01/24 2110)                                    Medical Decision Making Amount and/or Complexity of Data Reviewed Labs: ordered. Radiology: ordered.  Risk OTC drugs. Prescription drug management.   This patient presents to the ED for concern of chest pain, this involves an extensive number of treatment options, and is a complaint that carries with it a high risk of complications and morbidity.  The differential diagnosis includes ACS, GERD, PE, pneumonia, pancreatitis   Co morbidities that complicate the patient evaluation  History of H. pylori infection, GERD, chronic pain of both shoulders, cervical radiculopathy   Lab Tests:  I Ordered, and personally interpreted labs.  The pertinent results include: CBC unremarkable, BMP unremarkable for mild hypocalcemia, troponin negative at less than 6, lipase slightly elevated at 76, urine pregnancy negative, urinalysis with possible signs of developing infection for bacteria and some blood seen but no leukocytes or nitrites although patient does endorse some urinary discomfort   Imaging Studies ordered:  I ordered imaging studies including chest x-ray, CT abdomen pelvis I independently visualized and interpreted imaging which showed chest x-ray unremarkable, no CT evidence for acute intra-abdominal or pelvic abnormality. Slightly thick walled bladder, consider correlation with urinalysis to exclude cystitis. I agree with the radiologist interpretation   Cardiac Monitoring: / EKG:  The patient was maintained on a cardiac monitor.  I personally viewed and interpreted the cardiac monitored which showed an underlying rhythm of: Sinus rhythm    Problem List / ED Course / Critical interventions / Medication management  Patient with past history significant for GERD presents emergency department with concerns of chest pain.  She reports that she had a burning type sensation alongside a part of her chest with radiation from the  epigastrium towards the back of her throat.  Has occasionally had some pain between her shoulder blades.  Denies any nausea, vomiting, diarrhea.  No fever, chills or bodyaches.  No prior history of any cardiac abnormalities as far she is aware. On exam, she has reproducible chest wall tenderness over the left upper chest.  Epigastrium has slight tenderness but no guarding seen.  Normal bowel sounds.  Patient otherwise well-appearing with unremarkable vitals.  Based on patient's reported symptoms and history, cardiac workup initiated to rule out ACS with troponin.  Will also add on lipase for assessment of possible pancreatitis given area pain.  Other basic labs to evaluate for any underlying metabolic or cellular abnormalities.  Maalox and fentanyl  ordered for symptom management of symptoms. Patient lab workup shows mildly elevated lipase, but not at level of acute pancreatitis and without chronic alcohol history, doubt that this slight elevation is significant enough to diagnose pancreatitis, but would explain some of the epigastric discomfort patient has been experiencing. Troponin negative and only one needed since onset of symptoms has been present for over 72 hours. All other labs largely unremarkable. Discomfort slightly improved with GI cocktail and fentanyl . Given persistent symptoms, will proceed with CT imaging of the abdomen and pelvis for assessment of symptoms. CT imaging without acute findings to explain discomfort. Bladder looks somewhat irritated and thickened. After explanation of findings, patient has endorsed some increased urinary discomfort and feelings of incomplete voiding for the last 1+ week. UA added on. UA with trace blood and rare bacteria, but no leukocytes or nitrites. Not a convincing infection, but patient has symptoms concurrently present. Will send urine for culture and treat with Keflex  in the meantime. This likely does not explain her current symptoms. With a negative  cardiac workup, doubtful or cardiac etiology of symptoms. She is PERC negative. Pain was reproducible with palpation of the left upper chest wall, suspect likely MSK related. Will discharge with Protonix  and encouraged high dose ibuprofen . Encouraged close follow up with PCP and return precautions discussed. Otherwise stable for outpatient follow up and discharged home. I ordered medication including Maalox, fentanyl , fluids for GI cocktail, pain, he had recent Reevaluation of the patient after these medicines showed that the patient improved I have reviewed the patients home medicines and have made adjustments as needed   Test / Admission - Considered:  Stable for outpatient follow-up.  Final diagnoses:  Other chest pain  Acute cystitis without hematuria  Gastroesophageal reflux disease, unspecified whether esophagitis present    ED Discharge Orders          Ordered    pantoprazole  (PROTONIX ) 20 MG tablet  Daily        02/01/24 2257    cephALEXin  (KEFLEX ) 500 MG capsule  4 times daily        02/01/24 2257               Massimiliano Rohleder A, PA-C 02/02/24 1456    Lenor Hollering, MD 02/04/24 1345

## 2024-02-01 NOTE — Discharge Instructions (Signed)
 You were seen in the ER today for concerns of chest pain and upper abdominal pain. Your labs and imaging were thankfully reassuring today with no abnormal findings seen except a possible UTI with your bladder irritation. Based on the area of pain, this is likely muscular pain in your chest and you should take ibuprofen  for this. I am starting you on Protonix  for acid reflux and Keflex  to help treat your urinary symptoms. Return to the ER for any concerns of new or worsening symptoms. Otherwise, please follow up with your primary care provider.

## 2024-02-01 NOTE — ED Triage Notes (Signed)
 For a week patient has had heart burn but for the past 3 days she has also had chest pain. Denies N/V, endorses abd pain that also goes through to her back.

## 2024-02-03 LAB — URINE CULTURE
Culture: NO GROWTH
Special Requests: NORMAL
